# Patient Record
Sex: Female | Born: 1945
Health system: Southern US, Community
[De-identification: ages and names within clinical notes are randomized; demographics above are authoritative.]

## PROBLEM LIST (undated history)

## (undated) DIAGNOSIS — M8580 Other specified disorders of bone density and structure, unspecified site: Secondary | ICD-10-CM

## (undated) DIAGNOSIS — K269 Duodenal ulcer, unspecified as acute or chronic, without hemorrhage or perforation: Secondary | ICD-10-CM

## (undated) DIAGNOSIS — N951 Menopausal and female climacteric states: Secondary | ICD-10-CM

## (undated) DIAGNOSIS — L719 Rosacea, unspecified: Secondary | ICD-10-CM

## (undated) DIAGNOSIS — M810 Age-related osteoporosis without current pathological fracture: Secondary | ICD-10-CM

## (undated) HISTORY — DX: Duodenal ulcer, unspecified as acute or chronic, without hemorrhage or perforation: K26.9

## (undated) HISTORY — DX: Rosacea, unspecified: L71.9

## (undated) HISTORY — PX: TONSILLECTOMY AND ADENOIDECTOMY: SHX28

## (undated) HISTORY — PX: TUBAL LIGATION: SHX77

## (undated) HISTORY — DX: Age-related osteoporosis without current pathological fracture: M81.0

## (undated) HISTORY — DX: Menopausal and female climacteric states: N95.1

## (undated) HISTORY — DX: Other specified disorders of bone density and structure, unspecified site: M85.80

---

## 1998-08-26 ENCOUNTER — Other Ambulatory Visit: Admission: RE | Admit: 1998-08-26 | Discharge: 1998-08-26 | Payer: Self-pay | Admitting: Gynecology

## 1999-09-14 ENCOUNTER — Other Ambulatory Visit: Admission: RE | Admit: 1999-09-14 | Discharge: 1999-09-14 | Payer: Self-pay | Admitting: *Deleted

## 2000-03-08 ENCOUNTER — Other Ambulatory Visit: Admission: RE | Admit: 2000-03-08 | Discharge: 2000-03-08 | Payer: Self-pay | Admitting: *Deleted

## 2000-09-28 ENCOUNTER — Other Ambulatory Visit: Admission: RE | Admit: 2000-09-28 | Discharge: 2000-09-28 | Payer: Self-pay | Admitting: *Deleted

## 2001-10-28 ENCOUNTER — Other Ambulatory Visit: Admission: RE | Admit: 2001-10-28 | Discharge: 2001-10-28 | Payer: Self-pay | Admitting: *Deleted

## 2002-10-27 ENCOUNTER — Other Ambulatory Visit: Admission: RE | Admit: 2002-10-27 | Discharge: 2002-10-27 | Payer: Self-pay | Admitting: *Deleted

## 2002-11-03 ENCOUNTER — Encounter: Admission: RE | Admit: 2002-11-03 | Discharge: 2003-02-01 | Payer: Self-pay | Admitting: *Deleted

## 2003-10-29 ENCOUNTER — Other Ambulatory Visit: Admission: RE | Admit: 2003-10-29 | Discharge: 2003-10-29 | Payer: Self-pay | Admitting: *Deleted

## 2011-12-05 LAB — HM PAP SMEAR
HM Pap smear: NEGATIVE
HM Pap smear: NEGATIVE

## 2012-07-15 ENCOUNTER — Ambulatory Visit (INDEPENDENT_AMBULATORY_CARE_PROVIDER_SITE_OTHER): Payer: Medicare Other | Admitting: Family Medicine

## 2012-07-15 ENCOUNTER — Other Ambulatory Visit: Payer: Self-pay

## 2012-07-15 ENCOUNTER — Encounter: Payer: Self-pay | Admitting: Family Medicine

## 2012-07-15 VITALS — BP 110/70 | HR 58 | Wt 107.0 lb

## 2012-07-15 DIAGNOSIS — F5105 Insomnia due to other mental disorder: Secondary | ICD-10-CM

## 2012-07-15 DIAGNOSIS — F489 Nonpsychotic mental disorder, unspecified: Secondary | ICD-10-CM

## 2012-07-15 DIAGNOSIS — F4321 Adjustment disorder with depressed mood: Secondary | ICD-10-CM

## 2012-07-15 DIAGNOSIS — Z23 Encounter for immunization: Secondary | ICD-10-CM

## 2012-07-15 MED ORDER — ZOLPIDEM TARTRATE 5 MG PO TABS: 5.0000 mg | ORAL_TABLET | Freq: Every evening | ORAL | Status: DC | PRN

## 2012-07-15 NOTE — Progress Notes (Signed)
  Subjective:    Patient ID: RAINA SOLE, female    DOB: Dec 19, 1945, 66 y.o.   MRN: [id number]  HPI She is here for consultation. This is her first visit to the office as a patient. She did bring her husband here prior to his death. He died in 2023-02-12 after a long illness dealing with Alzheimer's. She has been involved in hospice and seems to be doing well with this. She does get regular gynecologic checkups. Review of record indicates need for colonoscopy however she is not interested. She states she had the shingles vaccine in 2012. She does use Ativan half of a 0.5 mg at bedtime 2 or 3 times per week. She would like a refill on this medication.   Review of Systems     Objective:   Physical Exam Alert and in no distress with appropriate affect.      Assessment & Plan:   1. Insomnia due to mental condition  zolpidem (AMBIEN) 5 MG tablet  2. Grief reaction  zolpidem (AMBIEN) 5 MG tablet   over 25 minutes spent discussing sleep and insomnia as well as her grief reaction. In general she seems to be handling the situation quite well. She does feel a sense of relief now that her husband has passed on. Instructed her on proper use of the Ambien.

## 2012-07-15 NOTE — Telephone Encounter (Signed)
Called med in 

## 2012-07-15 NOTE — Patient Instructions (Signed)

## 2013-04-28 ENCOUNTER — Ambulatory Visit (INDEPENDENT_AMBULATORY_CARE_PROVIDER_SITE_OTHER): Payer: Medicare Other | Admitting: Family Medicine

## 2013-04-28 ENCOUNTER — Encounter: Payer: Self-pay | Admitting: Family Medicine

## 2013-04-28 VITALS — BP 110/70 | HR 60 | Ht 62.0 in | Wt 106.0 lb

## 2013-04-28 DIAGNOSIS — R220 Localized swelling, mass and lump, head: Secondary | ICD-10-CM

## 2013-04-28 NOTE — Patient Instructions (Addendum)
Swelling of chin--discussed differential diagnosis including cystic lesion/acne, vs insect bite.  Doubt shingles, given lack of discomfort prior to rash, not really any vesicles, and limit to just one small area.  If truly is shingles, will spread in dermatomal region on right side of face, but doubt. Symptomatic treatment--warm compresses (vs ice).  May continue to use finacea over this area. Call if increasing redness, tenderness, heat/warmth to suggest infection, in which case we can start her on doxycycline.

## 2013-04-28 NOTE — Progress Notes (Signed)
Chief Complaint  Patient presents with  . Mass    bump that came up yeterday below her lip, just wanted to have it checked to make sure that is wasn't shingles. Bump in painful, not itching.   Yesterday, while out, she noticed a swollen area on her right chin.  It is throbbing, painful, not itchy. It is swollen today (around the central area).  It "feels funny".  Doesn't feel like a "zit".  She is concerned that it might be shingles.  She has had the vaccine. Denies pain prior to onset of swelling.  Denies any other lesions/rash  Past Medical History  Diagnosis Date  . Rosacea    History reviewed. No pertinent past surgical history. History   Social History  . Marital Status: Single    Spouse Name: N/A    Number of Children: N/A  . Years of Education: N/A   Occupational History  . Not on file.   Social History Main Topics  . Smoking status: Never Smoker   . Smokeless tobacco: Never Used  . Alcohol Use: No  . Drug Use: No  . Sexually Active: Not on file   Other Topics Concern  . Not on file   Social History Narrative  . No narrative on file   Current Outpatient Prescriptions on File Prior to Visit  Medication Sig Dispense Refill  . b complex vitamins capsule Take 1 capsule by mouth daily.      . calcium carbonate 1250 MG capsule Take 1,250 mg by mouth 2 (two) times daily with a meal.      . cholecalciferol (VITAMIN D) 1000 UNITS tablet Take 1,000 Units by mouth daily.      . Multiple Vitamins-Minerals (MULTIVITAMIN WITH MINERALS) tablet Take 1 tablet by mouth daily.      . vitamin E 1000 UNIT capsule Take 1,000 Units by mouth daily.       No current facility-administered medications on file prior to visit.   Allergies  Allergen Reactions  . Bactrim (Sulfamethoxazole W-Trimethoprim) Hives   ROS:  Denies fevers, numbness, tingling, oral lesions, URI symptoms, fevers, cough, shortness of breath, chest pain or any other concerns  PHYSICAL EXAM: BP 110/70  Pulse 60   Ht 5\' 2"  (1.575 m)  Wt 106 lb (48.081 kg)  BMI 19.38 kg/m2 Well developed female in no distress Below right lip, in upper chin area there is a 2 x 1 cm area of soft tissue swelling.  This area feels slightly indurated, and central area is a little red, no actual vesicle or pustule present currently.  No cervical lymphadenopathy  ASSESSMENT/PLAN:  Right facial swelling  Swelling of chin--discussed differential diagnosis including cystic lesion/acne, vs insect bite.  Doubt shingles, given lack of discomfort prior to rash, not really any vesicles, and limit to just one small area.  If truly is shingles, will spread in dermatomal region on right side of face, but doubt. Symptomatic treatment--warm compresses (vs ice).  May continue to use finacea over this area. Call if increasing redness, tenderness, heat/warmth to suggest infection, in which case we can start her on doxycycline.  Her GYN retired, and same office is no longer taking Medicare.  She plans to come here for routine CPE/GYN exam.  Asked to get records transferred.

## 2013-06-27 ENCOUNTER — Encounter: Payer: Self-pay | Admitting: Internal Medicine

## 2013-07-31 ENCOUNTER — Encounter: Payer: Self-pay | Admitting: *Deleted

## 2013-09-15 LAB — HM MAMMOGRAPHY

## 2013-09-16 ENCOUNTER — Encounter: Payer: Self-pay | Admitting: Internal Medicine

## 2013-10-01 ENCOUNTER — Encounter: Payer: Self-pay | Admitting: Family Medicine

## 2013-10-01 ENCOUNTER — Ambulatory Visit (INDEPENDENT_AMBULATORY_CARE_PROVIDER_SITE_OTHER): Payer: Medicare Other | Admitting: Family Medicine

## 2013-10-01 VITALS — BP 128/76 | HR 68 | Ht 62.0 in | Wt 106.0 lb

## 2013-10-01 DIAGNOSIS — R5381 Other malaise: Secondary | ICD-10-CM

## 2013-10-01 DIAGNOSIS — Z0000 Encounter for general adult medical examination without abnormal findings: Secondary | ICD-10-CM

## 2013-10-01 DIAGNOSIS — E7800 Pure hypercholesterolemia, unspecified: Secondary | ICD-10-CM

## 2013-10-01 DIAGNOSIS — M8580 Other specified disorders of bone density and structure, unspecified site: Secondary | ICD-10-CM | POA: Insufficient documentation

## 2013-10-01 DIAGNOSIS — L719 Rosacea, unspecified: Secondary | ICD-10-CM | POA: Insufficient documentation

## 2013-10-01 DIAGNOSIS — M899 Disorder of bone, unspecified: Secondary | ICD-10-CM

## 2013-10-01 DIAGNOSIS — J309 Allergic rhinitis, unspecified: Secondary | ICD-10-CM | POA: Insufficient documentation

## 2013-10-01 LAB — COMPREHENSIVE METABOLIC PANEL
ALT: 21 U/L (ref 0–35)
AST: 22 U/L (ref 0–37)
Albumin: 4.3 g/dL (ref 3.5–5.2)
Alkaline Phosphatase: 55 U/L (ref 39–117)
BUN: 15 mg/dL (ref 6–23)
CO2: 32 mEq/L (ref 19–32)
Calcium: 9.6 mg/dL (ref 8.4–10.5)
Chloride: 98 mEq/L (ref 96–112)
Creat: 0.67 mg/dL (ref 0.50–1.10)
Potassium: 4 mEq/L (ref 3.5–5.3)
Sodium: 135 mEq/L (ref 135–145)
Total Protein: 7.7 g/dL (ref 6.0–8.3)

## 2013-10-01 LAB — LIPID PANEL
HDL: 77 mg/dL (ref >39)
LDL Cholesterol: 128 mg/dL — ABNORMAL HIGH (ref 0–99)
Total CHOL/HDL Ratio: 2.9 Ratio

## 2013-10-01 LAB — CBC WITH DIFFERENTIAL/PLATELET
Eosinophils Absolute: 0.2 10*3/uL (ref 0.0–0.7)
Eosinophils Relative: 4 % (ref 0–5)
Hemoglobin: 13.2 g/dL (ref 12.0–15.0)
Lymphocytes Relative: 42 % (ref 12–46)
Lymphs Abs: 1.7 10*3/uL (ref 0.7–4.0)
MCH: 31.2 pg (ref 26.0–34.0)
MCHC: 34.2 g/dL (ref 30.0–36.0)
MCV: 91.3 fL (ref 78.0–100.0)
Monocytes Relative: 8 % (ref 3–12)
Neutrophils Relative %: 45 % (ref 43–77)
Platelets: 318 10*3/uL (ref 150–400)
WBC: 4.1 10*3/uL (ref 4.0–10.5)

## 2013-10-01 LAB — POCT URINALYSIS DIPSTICK
Bilirubin, UA: NEGATIVE
Glucose, UA: NEGATIVE
Ketones, UA: NEGATIVE
Leukocytes, UA: NEGATIVE
Nitrite, UA: NEGATIVE
Urobilinogen, UA: NEGATIVE
pH, UA: 6

## 2013-10-01 LAB — TSH: TSH: 0.742 u[IU]/mL (ref 0.350–4.500)

## 2013-10-01 NOTE — Progress Notes (Signed)
Chief Complaint  Patient presents with  . Annual Exam    fasting annual exam, unsure if she needs pelvic exam as her previous doctor is not taking her insurance anymore. Brought DEXA from 08/2012 would like to know if you could go over results with her. UA showed trace blood-no symptoms. Did not do vision as she just had done.    Elizabeth Haynes is a 67 y.o. female who presents for a complete physical.  She has the following concerns:  Osteopenia--brought in report of last DEXA  to discuss.  H/o fosamax treatment in the past for about 5 years, stopped over 5 years ago (2008 per records).  Hesitant to take any meds, has concerns.  She is taking calcium, vitamin D and getting weight bearing exercise.  Has been having issues with allergies.  Had some cough, but has improved.  Immunization History  Administered Date(s) Administered  . Influenza Split 08/11/2013  . Pneumococcal Polysaccharide 07/15/2012  . Tdap 07/15/2012  states she got Zostavax at [facility] in 2012 (?) Last Pap smear: 11/2011--negative, with no high risk HPV Last mammogram: 08/2013 Last colonoscopy: never and refuses Last DEXA: 08/2012, T-2.2 lumbar spine Dentist: twice yearly Ophtho: every other year, went 06/2013 Exercise:  Walks 1.5 miles daily (30 minutes).  Does weight routine 2x/week Had normal vitamin D level 10/2009 (47.5) Last lipids 01/2007 normal (TC 194, TG 109, HDL 70; LDL 102, ratio 2.8).  Lipids in 10/07 had been much higher (total 254, LDL 150) when eating more cheese and butter Last TSH, LFT's and other tests were 10/07  Past Medical History  Diagnosis Date  . Rosacea   . Osteopenia   . Symptomatic menopausal or female climacteric states     Past Surgical History  Procedure Laterality Date  . Tonsillectomy and adenoidectomy  age 66  . Tubal ligation      History   Social History  . Marital Status: Single    Spouse Name: N/A    Number of Children: N/A  . Years of Education: N/A   Occupational  History  . Not on file.   Social History Main Topics  . Smoking status: Never Smoker   . Smokeless tobacco: Never Used  . Alcohol Use: No  . Drug Use: No  . Sexual Activity: No   Other Topics Concern  . Not on file   Social History Narrative   Lives with dog.  2 stepchildren ([place] and [place]).  Widowed    Family History  Problem Relation Age of Onset  . Dementia Mother   . Hip fracture Mother     x 2  . Alzheimer's disease Mother   . Hypertension Mother   . Heart disease Mother     pacemaker  . Cancer Father     thyroid,lung,prostate  . Breast cancer Maternal Grandmother   . Heart attack Maternal Grandfather   . Diabetes Other     Current outpatient prescriptions:Azelaic Acid (FINACEA EX), Apply topically., Disp: , Rfl: ;  b complex vitamins capsule, Take 1 capsule by mouth daily., Disp: , Rfl: ;  calcium carbonate 1250 MG capsule, Take 1,250 mg by mouth 2 (two) times daily with a meal., Disp: , Rfl: ;  cholecalciferol (VITAMIN D) 1000 UNITS tablet, Take 1,000 Units by mouth daily., Disp: , Rfl:  Multiple Vitamins-Minerals (MULTIVITAMIN WITH MINERALS) tablet, Take 1 tablet by mouth daily., Disp: , Rfl: ;  vitamin E 1000 UNIT capsule, Take 1,000 Units by mouth daily., Disp: , Rfl: ;  doxycycline (DORYX) 100 MG DR capsule, Take 100 mg by mouth as needed., Disp: , Rfl:   Allergies  Allergen Reactions  . Bactrim [Sulfamethoxazole-Trimethoprim] Hives   ROS:  The patient denies anorexia, fever, weight changes, headaches,  vision changes, decreased hearing, ear pain, sore throat, breast concerns, chest pain, palpitations, dizziness, syncope, dyspnea on exertion, cough, swelling, nausea, vomiting, diarrhea, constipation, abdominal pain, melena, hematochezia, indigestion/heartburn, hematuria, incontinence, dysuria, vaginal bleeding, discharge, odor or itch, genital lesions, joint pains, numbness, tingling, weakness, tremor, suspicious skin lesions, depression, anxiety, abnormal  bleeding/bruising, or enlarged lymph nodes.  Sees dermatologist regularly, no mole changes or concerns. Intermittent trouble sleeping.  No significant hot flashes  PHYSICAL EXAM: BP 128/76  Pulse 68  Ht 5\' 2"  (1.575 m)  Wt 106 lb (48.081 kg)  BMI 19.38 kg/m2  General Appearance:    Alert, cooperative, no distress, appears stated age.  Very talkative  Head:    Normocephalic, without obvious abnormality, atraumatic  Eyes:    PERRL, conjunctiva/corneas clear, EOM's intact, fundi    benign  Ears:    Normal TM's and external ear canals  Nose:   Nares normal, nasal mucosa pale, mildly edematous.   Sinuses nontender  Throat:   Lips, mucosa, and tongue normal; teeth and gums normal. +cobblestoning posteriorly.  Neck:   Supple, no lymphadenopathy;  thyroid:  no   enlargement/tenderness/nodules; no carotid   bruit or JVD  Back:    Spine nontender, no curvature, ROM normal, no CVA     tenderness  Lungs:     Clear to auscultation bilaterally without wheezes, rales or     ronchi; respirations unlabored  Chest Wall:    No tenderness or deformity   Heart:    Regular rate and rhythm, S1 and S2 normal, no murmur, rub   or gallop  Breast Exam:    No tenderness, masses, or nipple discharge or inversion.      No axillary lymphadenopathy  Abdomen:     Soft, non-tender, nondistended, normoactive bowel sounds,    no masses, no hepatosplenomegaly  Genitalia:    Normal external genitalia without lesions. Mild atrophic changes noted. BUS and vagina normal; no cervical motion tenderness. No abnormal vaginal discharge.  Uterus and adnexa not enlarged, nontender, no masses.  Pap not performed  Rectal:    Normal tone, no masses or tenderness; guaiac negative stool  Extremities:   No clubbing, cyanosis or edema  Pulses:   2+ and symmetric all extremities  Skin:   Skin color, texture, turgor normal, no rashes or lesions. Many large SK's scattered around trunk, arms  Lymph nodes:   Cervical, supraclavicular, and  axillary nodes normal  Neurologic:   CNII-XII intact, normal strength, sensation and gait; reflexes 2+ and symmetric throughout          Psych:   Normal mood, affect, hygiene and grooming.    ASSESSMENT/PLAN:  Routine general medical examination at a health care facility - Plan: POCT Urinalysis Dipstick, Comprehensive metabolic panel, CBC with Differential, TSH, Lipid panel  Osteopenia - continue Calcium, Vit D, weight bearing exercise.  Recheck DEXA 08/2014 (at [facility])  Rosacea - controlled (per derm)  Pure hypercholesterolemia - high in past; better after dietary changes.  recheck today - Plan: Lipid panel  Other malaise and fatigue - Plan: Comprehensive metabolic panel, CBC with Differential, TSH  Allergic rhinitis, cause unspecified - OTC meds reviewed  Allergies, seasonal--feels too "hyper" from the zyrtec-D. Recommended changing to plain zyrtec or claritin as needed.  Osteopenia--repeat  DEXA next year 08/2014, at [place].  Reviewed calcium, vitamin D, weight bearing exercise.  Briefly discussed possibility of restarting bisphosphonate if declining significantly  Discussed monthly self breast exams and yearly mammograms; at least 30 minutes of aerobic activity at least 5 days/week; proper sunscreen use reviewed; healthy diet, including goals of calcium and vitamin D intake and alcohol recommendations (less than or equal to 1 drink/day) reviewed; regular seatbelt use; changing batteries in smoke detectors.  Immunization recommendations discussed.  Colonoscopy recommendations reviewed--she refuses. Counseled re: colonoscopy (risks, benefits) in detail.  Will call for referral if she changes her mind.  Hemoccult cards given  Pt will check date for zostavax and let us know.  If unable to find, will let us know so that we can at least document in computer the year, showing it was given.

## 2013-10-01 NOTE — Patient Instructions (Addendum)
HEALTH MAINTENANCE RECOMMENDATIONS:  It is recommended that you get at least 30 minutes of aerobic exercise at least 5 days/week (for weight loss, you may need as much as 60-90 minutes). This can be any activity that gets your heart rate up. This can be divided in 10-15 minute intervals if needed, but try and build up your endurance at least once a week.  Weight bearing exercise is also recommended twice weekly.  Eat a healthy diet with lots of vegetables, fruits and fiber.  "Colorful" foods have a lot of vitamins (ie green vegetables, tomatoes, red peppers, etc).  Limit sweet tea, regular sodas and alcoholic beverages, all of which has a lot of calories and sugar.  Up to 1 alcoholic drink daily may be beneficial for women (unless trying to lose weight, watch sugars).  Drink a lot of water.  Calcium recommendations are 1200-1500 mg daily (1500 mg for postmenopausal women or women without ovaries), and vitamin D 1000 IU daily.  This should be obtained from diet and/or supplements (vitamins), and calcium should not be taken all at once, but in divided doses.  Monthly self breast exams and yearly mammograms for women over the age of 3 is recommended.  Sunscreen of at least SPF 30 should be used on all sun-exposed parts of the skin when outside between the hours of 10 am and 4 pm (not just when at beach or pool, but even with exercise, golf, tennis, and yard work!)  Use a sunscreen that says "broad spectrum" so it covers both UVA and UVB rays, and make sure to reapply every 1-2 hours.  Remember to change the batteries in your smoke detectors when changing your clock times in the spring and fall.  Use your seat belt every time you are in a car, and please drive safely and not be distracted with cell phones and texting while driving.  Please call us with date of shingles vaccine (call us even if you can't find it, so that we can at least enter the YEAR given in the computer, to document that you did have  the vaccine).  Repeat bone density test is due 08/2014

## 2013-10-02 ENCOUNTER — Telehealth: Payer: Self-pay | Admitting: Family Medicine

## 2013-10-02 NOTE — Telephone Encounter (Signed)
Pt called and states she rcd shingles vaccine on 2045-12-22 at [facility].  I have documented in her historical immunizations.

## 2013-10-03 ENCOUNTER — Encounter: Payer: Self-pay | Admitting: Family Medicine

## 2013-10-16 ENCOUNTER — Telehealth: Payer: Self-pay | Admitting: Internal Medicine

## 2013-10-16 ENCOUNTER — Telehealth: Payer: Self-pay | Admitting: *Deleted

## 2013-10-16 NOTE — Telephone Encounter (Signed)
OV recommended to truly be able to address her question.  She had evidence of allergies on her last visit.  If she isn't having any fever or any discolored mucus (from nose or cough), then likely doesn't need antibiotic.  But she still might benefit from changes in medications to treat allergies (can discuss meds like singulair, nasal steroids).  I recommend using the guaifenesin (mucinex) twice daily, continue antihistamine and decongestant as needed

## 2013-10-16 NOTE — Telephone Encounter (Signed)
Pt was seen back on 11/5 and she states she discussed this at her visit some and she isn't sure if she doesn't need an antibiotic.She states she is congested,sinus pressure,ear and teeth pain. She is taking some decongestant during the day, 24 hour benadryl, and 12 hour mucinex in the morning. Marland Kitchen i advised pt that she will probably need an appt but i would send a message back. If you send something to pharmacy send to [facility]

## 2013-10-16 NOTE — Telephone Encounter (Signed)
Spoke with patient and she will do as Dr.Knapp's says and she will call next week if she is not better and schedule OV.

## 2014-08-11 ENCOUNTER — Telehealth: Payer: Self-pay | Admitting: Family Medicine

## 2014-08-11 NOTE — Telephone Encounter (Signed)
Needs order for bone density sent to [facility]   Her appt is 09/16/14 @ 2:15

## 2014-08-11 NOTE — Telephone Encounter (Signed)
Ok to fax order, dx f/u osteopenia

## 2014-08-12 NOTE — Telephone Encounter (Signed)
Order faxed.

## 2014-09-16 LAB — HM DEXA SCAN

## 2014-09-16 LAB — HM MAMMOGRAPHY

## 2014-09-22 ENCOUNTER — Encounter: Payer: Self-pay | Admitting: Internal Medicine

## 2015-04-01 ENCOUNTER — Ambulatory Visit (INDEPENDENT_AMBULATORY_CARE_PROVIDER_SITE_OTHER): Payer: Medicare Other | Admitting: Family Medicine

## 2015-04-01 ENCOUNTER — Encounter: Payer: Self-pay | Admitting: Family Medicine

## 2015-04-01 ENCOUNTER — Telehealth: Payer: Self-pay | Admitting: *Deleted

## 2015-04-01 ENCOUNTER — Other Ambulatory Visit (HOSPITAL_COMMUNITY)
Admission: RE | Admit: 2015-04-01 | Discharge: 2015-04-01 | Disposition: A | Discharge status: Home / Self Care | Payer: Medicare Other | Source: Ambulatory Visit | Attending: Family Medicine | Admitting: Family Medicine

## 2015-04-01 VITALS — BP 118/72 | HR 52 | Ht 62.0 in | Wt 114.0 lb

## 2015-04-01 DIAGNOSIS — Z23 Encounter for immunization: Secondary | ICD-10-CM | POA: Diagnosis not present

## 2015-04-01 DIAGNOSIS — Z0000 Encounter for general adult medical examination without abnormal findings: Secondary | ICD-10-CM

## 2015-04-01 DIAGNOSIS — Z124 Encounter for screening for malignant neoplasm of cervix: Secondary | ICD-10-CM | POA: Diagnosis present

## 2015-04-01 DIAGNOSIS — Z1151 Encounter for screening for human papillomavirus (HPV): Secondary | ICD-10-CM | POA: Diagnosis present

## 2015-04-01 DIAGNOSIS — Z5181 Encounter for therapeutic drug level monitoring: Secondary | ICD-10-CM | POA: Diagnosis not present

## 2015-04-01 DIAGNOSIS — M8580 Other specified disorders of bone density and structure, unspecified site: Secondary | ICD-10-CM

## 2015-04-01 DIAGNOSIS — E7800 Pure hypercholesterolemia, unspecified: Secondary | ICD-10-CM

## 2015-04-01 LAB — POCT URINALYSIS DIPSTICK
BILIRUBIN UA: NEGATIVE
Blood, UA: NEGATIVE
GLUCOSE UA: NEGATIVE
Ketones, UA: NEGATIVE
Leukocytes, UA: NEGATIVE
Nitrite, UA: NEGATIVE
Protein, UA: NEGATIVE
SPEC GRAV UA: 1.015
UROBILINOGEN UA: NEGATIVE
pH, UA: 6

## 2015-04-01 LAB — COMPREHENSIVE METABOLIC PANEL
ALK PHOS: 53 U/L (ref 39–117)
ALT: 21 U/L (ref 0–35)
AST: 23 U/L (ref 0–37)
Albumin: 4.2 g/dL (ref 3.5–5.2)
BUN: 13 mg/dL (ref 6–23)
CHLORIDE: 97 meq/L (ref 96–112)
CO2: 25 meq/L (ref 19–32)
Calcium: 9.4 mg/dL (ref 8.4–10.5)
Creat: 0.7 mg/dL (ref 0.50–1.10)
Glucose, Bld: 91 mg/dL (ref 70–99)
POTASSIUM: 3.9 meq/L (ref 3.5–5.3)
SODIUM: 133 meq/L — AB (ref 135–145)
TOTAL PROTEIN: 7.6 g/dL (ref 6.0–8.3)
Total Bilirubin: 0.7 mg/dL (ref 0.2–1.2)

## 2015-04-01 LAB — LIPID PANEL
Cholesterol: 216 mg/dL — ABNORMAL HIGH (ref 0–200)
HDL: 101 mg/dL (ref >=46)
LDL CALC: 103 mg/dL — AB (ref 0–99)
Total CHOL/HDL Ratio: 2.1 Ratio
Triglycerides: 62 mg/dL (ref <150)
VLDL: 12 mg/dL (ref 0–40)

## 2015-04-01 LAB — TSH: TSH: 0.971 u[IU]/mL (ref 0.350–4.500)

## 2015-04-01 NOTE — Patient Instructions (Signed)

## 2015-04-01 NOTE — Progress Notes (Signed)
Chief Complaint  Patient presents with  . Annual Exam    fasting annual exam with pap. No concerns.    Elizabeth Haynes is a 69 y.o. female who presents for a complete physical.  She has the following concerns:  Osteopenia:  Previously took Fosamax for about 5 years (stopped around 2008).  Last DEXA was 08/2014 and showed decline in hips, T-2.4. She is taking calcium and D supplements, unsure of doses. Thinks her D is 3000 BID and she takes a calcium +D BID (and also takes a MVI).  She uses weights every other day.  H/o hyperlipidemia, improved with dietary changes. Last checked 09/2013.  Allergies: seasonally in spring and fall.  She uses neti-pot and Flonase and is well controlled.  Allegra "dried me up too much"  Immunization History  Administered Date(s) Administered  . Influenza Split 08/11/2013  . Pneumococcal Polysaccharide-23 07/15/2012  . Tdap 07/15/2012  . Zoster 11/10/2009  got high dose flu shot at [facility] [employer] (07/2014) Last Pap smear: 11/2011--negative, with no high risk HPV Last mammogram: 08/2014 Last colonoscopy: never and refuses Last DEXA: 08/2014, T-2.4 with significant decline in both hips compared to 2015. Dentist: twice yearly Ophtho: up to date, think she went last year.  May have been 06/2013 (seh can't recall, but knows she only goes every other year) Exercise: Walks 1.5 miles daily (30 minutes). Does weight routine every other day. Had normal vitamin D level 10/2009 (47.5); but as noted above, had decline in bone density since then.  She has healthcare power of attorney and Living Will. No falls or depression (see screen).  Past Medical History  Diagnosis Date  . Rosacea   . Osteopenia   . Symptomatic menopausal or female climacteric states     Past Surgical History  Procedure Laterality Date  . Tonsillectomy and adenoidectomy  age 58  . Tubal ligation      History   Social History  . Marital Status: Single    Spouse Name: N/A  . Number of  Children: N/A  . Years of Education: N/A   Occupational History  . Not on file.   Social History Main Topics  . Smoking status: Never Smoker   . Smokeless tobacco: Never Used  . Alcohol Use: No  . Drug Use: No  . Sexual Activity: No   Other Topics Concern  . Not on file   Social History Narrative   Lives with dog.  2 stepchildren (Bricelyn and Sherrill).  Widowed. 8 grandchildren    Family History  Problem Relation Age of Onset  . Dementia Mother   . Hip fracture Mother     x 2  . Alzheimer's disease Mother   . Hypertension Mother   . Heart disease Mother     pacemaker  . Cancer Father     thyroid,lung,prostate  . Breast cancer Maternal Grandmother   . Heart attack Maternal Grandfather   . Diabetes Other    Current Outpatient Prescriptions on File Prior to Visit  Medication Sig Dispense Refill  . Azelaic Acid (FINACEA EX) Apply topically.    Marland Kitchen b complex vitamins capsule Take 1 capsule by mouth daily.    . calcium carbonate 1250 MG capsule Take 1,250 mg by mouth 2 (two) times daily with a meal.    . cholecalciferol (VITAMIN D) 1000 UNITS tablet Take 1,000 Units by mouth daily.    Marland Kitchen doxycycline (DORYX) 100 MG DR capsule Take 100 mg by mouth as needed.    Marland Kitchen  Multiple Vitamins-Minerals (MULTIVITAMIN WITH MINERALS) tablet Take 1 tablet by mouth daily.    . vitamin E 1000 UNIT capsule Take 1,000 Units by mouth daily.     No current facility-administered medications on file prior to visit.    Allergies  Allergen Reactions  . Bactrim [Sulfamethoxazole-Trimethoprim] Hives   ROS: The patient denies anorexia, fever, weight changes, headaches, vision changes, decreased hearing, ear pain, sore throat, breast concerns, chest pain, palpitations, dizziness, syncope, dyspnea on exertion, cough, swelling, nausea, vomiting, diarrhea, constipation, abdominal pain, melena, hematochezia, indigestion/heartburn, hematuria, incontinence, dysuria, vaginal bleeding, discharge, odor or itch,  genital lesions, joint pains, numbness, tingling, weakness, tremor, suspicious skin lesions, depression, anxiety, abnormal bleeding/bruising, or enlarged lymph nodes. Sees dermatologist periodically, no mole changes or concerns. Intermittent trouble sleeping. No significant hot flashes   PHYSICAL EXAM:  BP 118/72 mmHg  Pulse 52  Ht 5\' 2"  (1.575 m)  Wt 114 lb (51.71 kg)  BMI 20.85 kg/m2  General Appearance:   Alert, cooperative, no distress, appears stated age. Very talkative  Head:   Normocephalic, without obvious abnormality, atraumatic  Eyes:   PERRL, conjunctiva/corneas clear, EOM's intact, fundi   benign  Ears:   Normal TM's and external ear canals  Nose:  Nares normal, nasal mucosa is normal, no drainage. Sinuses nontender  Throat:  Lips, mucosa, and tongue normal; teeth and gums normal. +mild cobblestoning posteriorly.  Neck:  Supple, no lymphadenopathy; thyroid: no enlargement/tenderness/nodules; no carotid  bruit or JVD  Back:  Spine nontender, no curvature, ROM normal, no CVA tenderness  Lungs:   Clear to auscultation bilaterally without wheezes, rales or ronchi; respirations unlabored  Chest Wall:   No tenderness or deformity  Heart:   Regular rate and rhythm, S1 and S2 normal, no murmur, rub  or gallop  Breast Exam:   No tenderness, masses, or nipple discharge or inversion. No axillary lymphadenopathy  Abdomen:   Soft, non-tender, nondistended, normoactive bowel sounds,   no masses, no hepatosplenomegaly  Genitalia:   Normal external genitalia without lesions. Atrophic changes noted. BUS and vagina normal; no cervical motion tenderness. Cervix is without lesions.  Os is stenotic. No abnormal vaginal discharge. Uterus and adnexa not enlarged, nontender, no masses. Pap performed  Rectal:   Normal tone, no masses or tenderness; guaiac negative stool  Extremities:  No clubbing, cyanosis or edema   Pulses:  2+ and symmetric all extremities  Skin:  Skin color, texture, turgor normal, no rashes or lesions. Many large SK's scattered around trunk, arms  Lymph nodes:  Cervical, supraclavicular, and axillary nodes normal  Neurologic:  CNII-XII intact, normal strength, sensation and gait; reflexes 2+ and symmetric throughout   Psych: Normal mood, affect, hygiene and grooming        ASSESSMENT/PLAN:  Annual physical exam - Plan: POCT Urinalysis Dipstick, Visual acuity screening, Comprehensive metabolic panel, TSH, Vit D  25 hydroxy (rtn osteoporosis monitoring), Lipid panel, Cytology - PAP [place]  Osteopenia - declining bone density.  reviewed calcium and D recommendations (she is to contact us with doses). recheck 08/2016 - Plan: Vit D  25 hydroxy (rtn osteoporosis monitoring)  Pure hypercholesterolemia - continue lowfat, low cholesterol diet - Plan: Lipid panel  Medication monitoring encounter - Plan: Comprehensive metabolic panel, TSH, Vit D  25 hydroxy (rtn osteoporosis monitoring)  Immunization due - Plan: Pneumococcal conjugate vaccine 13-valent   Osteopenia--on borderline of osteoporosis with decline from 2013 to 2015.  Suspect she likely will need to restart bone-building therapy after next check.  Continue  calcium, D and weight-bearing exercise to try and minimize further bone loss.  Need to confirm her Ca and D doses.  Discussed monthly self breast exams and yearly mammograms; at least 30 minutes of aerobic activity at least 5 days/week; proper sunscreen use reviewed; healthy diet, including goals of calcium and vitamin D intake and alcohol recommendations (less than or equal to 1 drink/day) reviewed; regular seatbelt use; changing batteries in smoke detectors. Immunization recommendations discussed. Colonoscopy recommendations reviewed--she refuses. Counseled re: colonoscopy (risks, benefits) in detail. She again declines, but is  interested in Cologard.  Discussed that colonoscopy would be recommended for abnormal cologard, and she understands this.  Cologard info and referral Prevnar-13  F/u 1 year

## 2015-04-01 NOTE — Telephone Encounter (Signed)
Patient called in and said she is taking vitamin D 2,000iu daily and 500iu within her MVI.

## 2015-04-02 LAB — CYTOLOGY - PAP

## 2015-04-02 LAB — VITAMIN D 25 HYDROXY (VIT D DEFICIENCY, FRACTURES): Vit D, 25-Hydroxy: 67 ng/mL (ref 30–100)

## 2015-10-01 LAB — HM MAMMOGRAPHY: HM MAMMO: NEGATIVE

## 2016-02-03 DIAGNOSIS — L821 Other seborrheic keratosis: Secondary | ICD-10-CM | POA: Diagnosis not present

## 2016-02-03 DIAGNOSIS — L718 Other rosacea: Secondary | ICD-10-CM | POA: Diagnosis not present

## 2016-02-03 DIAGNOSIS — L218 Other seborrheic dermatitis: Secondary | ICD-10-CM | POA: Diagnosis not present

## 2016-05-31 DIAGNOSIS — H5213 Myopia, bilateral: Secondary | ICD-10-CM | POA: Diagnosis not present

## 2016-08-01 DIAGNOSIS — Z124 Encounter for screening for malignant neoplasm of cervix: Secondary | ICD-10-CM | POA: Diagnosis not present

## 2016-08-01 DIAGNOSIS — M8580 Other specified disorders of bone density and structure, unspecified site: Secondary | ICD-10-CM | POA: Diagnosis not present

## 2016-09-27 DIAGNOSIS — M810 Age-related osteoporosis without current pathological fracture: Secondary | ICD-10-CM

## 2016-09-27 HISTORY — DX: Age-related osteoporosis without current pathological fracture: M81.0

## 2016-10-02 DIAGNOSIS — M810 Age-related osteoporosis without current pathological fracture: Secondary | ICD-10-CM | POA: Diagnosis not present

## 2016-10-02 DIAGNOSIS — Z1231 Encounter for screening mammogram for malignant neoplasm of breast: Secondary | ICD-10-CM | POA: Diagnosis not present

## 2016-10-02 LAB — HM MAMMOGRAPHY

## 2016-10-02 LAB — HM DEXA SCAN

## 2016-10-03 ENCOUNTER — Encounter: Payer: Self-pay | Admitting: Family Medicine

## 2016-10-04 ENCOUNTER — Encounter: Payer: Self-pay | Admitting: Family Medicine

## 2016-10-12 DIAGNOSIS — M810 Age-related osteoporosis without current pathological fracture: Secondary | ICD-10-CM | POA: Diagnosis not present

## 2016-10-16 ENCOUNTER — Institutional Professional Consult (permissible substitution): Payer: Medicare Other | Admitting: Family Medicine

## 2017-08-08 DIAGNOSIS — K045 Chronic apical periodontitis: Secondary | ICD-10-CM | POA: Diagnosis not present

## 2017-09-01 DIAGNOSIS — Z23 Encounter for immunization: Secondary | ICD-10-CM | POA: Diagnosis not present

## 2017-09-17 DIAGNOSIS — N6310 Unspecified lump in the right breast, unspecified quadrant: Secondary | ICD-10-CM | POA: Diagnosis not present

## 2017-09-17 DIAGNOSIS — M810 Age-related osteoporosis without current pathological fracture: Secondary | ICD-10-CM | POA: Diagnosis not present

## 2017-10-08 DIAGNOSIS — Z1231 Encounter for screening mammogram for malignant neoplasm of breast: Secondary | ICD-10-CM | POA: Diagnosis not present

## 2017-10-08 LAB — HM MAMMOGRAPHY

## 2017-10-09 ENCOUNTER — Encounter: Payer: Self-pay | Admitting: Family Medicine

## 2018-04-15 DIAGNOSIS — H2513 Age-related nuclear cataract, bilateral: Secondary | ICD-10-CM | POA: Diagnosis not present

## 2018-04-15 DIAGNOSIS — H1851 Endothelial corneal dystrophy: Secondary | ICD-10-CM | POA: Diagnosis not present

## 2018-04-15 DIAGNOSIS — H04123 Dry eye syndrome of bilateral lacrimal glands: Secondary | ICD-10-CM | POA: Diagnosis not present

## 2018-04-15 DIAGNOSIS — H43813 Vitreous degeneration, bilateral: Secondary | ICD-10-CM | POA: Diagnosis not present

## 2018-06-21 DIAGNOSIS — Z0000 Encounter for general adult medical examination without abnormal findings: Secondary | ICD-10-CM | POA: Diagnosis not present

## 2018-07-07 DIAGNOSIS — M810 Age-related osteoporosis without current pathological fracture: Secondary | ICD-10-CM | POA: Insufficient documentation

## 2018-07-07 NOTE — Patient Instructions (Addendum)
HEALTH MAINTENANCE RECOMMENDATIONS:  It is recommended that you get at least 30 minutes of aerobic exercise at least 5 days/week (for weight loss, you may need as much as 60-90 minutes). This can be any activity that gets your heart rate up. This can be divided in 10-15 minute intervals if needed, but try and build up your endurance at least once a week.  Weight bearing exercise is also recommended twice weekly.  Eat a healthy diet with lots of vegetables, fruits and fiber.  "Colorful" foods have a lot of vitamins (ie green vegetables, tomatoes, red peppers, etc).  Limit sweet tea, regular sodas and alcoholic beverages, all of which has a lot of calories and sugar.  Up to 1 alcoholic drink daily may be beneficial for women (unless trying to lose weight, watch sugars).  Drink a lot of water.  Calcium recommendations are 1200-1500 mg daily (1500 mg for postmenopausal women or women without ovaries), and vitamin D 1000 IU daily.  This should be obtained from diet and/or supplements (vitamins), and calcium should not be taken all at once, but in divided doses.  Monthly self breast exams and yearly mammograms for women over the age of 60 is recommended.  Sunscreen of at least SPF 30 should be used on all sun-exposed parts of the skin when outside between the hours of 10 am and 4 pm (not just when at beach or pool, but even with exercise, golf, tennis, and yard work!)  Use a sunscreen that says "broad spectrum" so it covers both UVA and UVB rays, and make sure to reapply every 1-2 hours.  Remember to change the batteries in your smoke detectors when changing your clock times in the spring and fall.  Use your seat belt every time you are in a car, and please drive safely and not be distracted with cell phones and texting while driving.    Ms. Elizabeth Haynes , Thank you for taking time to come for your Medicare Wellness Visit. I appreciate your ongoing commitment to your health goals. Please review the following  plan we discussed and let me know if I can assist you in the future.   This is a list of the screening recommended for you and due dates:  Health Maintenance  Topic Date Due  .  Hepatitis C: One time screening is recommended by Center for Disease Control  (CDC) for  adults born from [birth year] through 1965.   December 05, 1945  . Colon Cancer Screening  09/17/1996  . Flu Shot  06/27/2018  . Mammogram  10/09/2019  . Tetanus Vaccine  07/15/2022  . DEXA scan (bone density measurement)  Completed  . Pneumonia vaccines  Completed   We are screening for hepatitis C with today's bloodwork. Your next mammogram is due 09/2018 (not 2020 as stated above--they are recommended yearly).  Continue yearly high dose flu shots (not yet available), in the Fall (Sept/Oct).  Colon cancer screening is recommended.  We again discussed Cologuard. I believe that [employer] now covers this, so we will refer again. You can verify with your insurance and NOT do it if it turns out you will have a very large out of pocket expense.  Please get [place] the dates (or have the pharmacy fax the info) of your Shingrix injections so that we can put it in your chart.  Please resume taking your alendronate/fosamax as we discussed, and discuss any concerns/issues with your GYN at your next visit. We discussed Prolia and Evista as alternatives, if not tolerating  the alendronate (Prolia is preferred).

## 2018-07-07 NOTE — Progress Notes (Signed)
Chief Complaint  Patient presents with  . Medicare Wellness    fasting AWV without pap, no concerns. Sees optho.     Elizabeth Haynes is a 72 y.o. female who presents for annual physical exam, Medicare wellness visit and follow-up on chronic medical conditions.   H/o osteopenia:  Previously took Fosamax for about 5 years (stopped around 2008).  DEXA was 08/2014 and showed decline in hips, T-2.4.  This was last checked 09/2016 by her GYN, showing further decline in all areas, most notably in Spine, where T went from -2.4 to -3.2.  She reports they put her on fosamax, took it for a year, then took herself off of it.  "I do not like that drug".  She had a reaction to oral surgery, had to take antibiotics, which caused GI issues; she thought fosamax also caused GI issues (diarrhea).  She is taking calcium and D supplements She uses weights every other day. She has upcoming appt with Dr. Valentino Saxon in October. Still has a lot of fosamax at home.  H/o hyperlipidemia, improved with dietary changes. Last checked in 03/2015. Lab Results  Component Value Date   CHOL 216 (H) 04/01/2015   HDL 101 04/01/2015   LDLCALC 103 (H) 04/01/2015   TRIG 62 04/01/2015   CHOLHDL 2.1 04/01/2015   Allergies: seasonally in spring and fall.  She uses neti-pot and Flonase when needed, not yet.  She is using Nettle leaf, which is helpful.    Immunization History  Administered Date(s) Administered  . Influenza Split 08/11/2013, 08/16/2015  . Pneumococcal Conjugate-13 04/01/2015  . Pneumococcal Polysaccharide-23 07/15/2012  . Tdap 07/15/2012  . Zoster 11/10/2009  She reports she had Shingrix x 2 last year from pharmacy Last Pap smear: here 03/2015, negative, with no high risk HPV. She sees Dr. Valentino Saxon, (no pap done since then). Last mammogram:  09/2017 Last colonoscopy: never and refuses Last DEXA: 09/2016, T-3.2 at spine. Dentist: twice yearly Ophtho: every other year Exercise: Walks 40 minutes 6x/week. Does  weight routine every other day. Vitamin D screen: 68 in 03/2015  She has healthcare power of attorney and Living Will. Fall screen: negative Depression screen: negative Functional status survey is unremarkable Mini-Cog screen: normal (5)  See full screens in Epic  Other doctors caring for patient include:  GYN: Dr. Pamala Hurry Dentist: Dr. Arita Miss Ophtho: Dr. Kathrin Penner Derm: Dr. Rolm Bookbinder (prn only)  Past Medical History:  Diagnosis Date  . Osteopenia   . Osteoporosis 09/2016  . Rosacea   . Symptomatic menopausal or female climacteric states     Past Surgical History:  Procedure Laterality Date  . TONSILLECTOMY AND ADENOIDECTOMY  age 60  . TUBAL LIGATION      Social History   Socioeconomic History  . Marital status: Single    Spouse name: Not on file  . Number of children: Not on file  . Years of education: Not on file  . Highest education level: Not on file  Occupational History  . Not on file  Social Needs  . Financial resource strain: Not on file  . Food insecurity:    Worry: Not on file    Inability: Not on file  . Transportation needs:    Medical: Not on file    Non-medical: Not on file  Tobacco Use  . Smoking status: Never Smoker  . Smokeless tobacco: Never Used  Substance and Sexual Activity  . Alcohol use: No  . Drug use: No  . Sexual activity: Not Currently  Lifestyle  . Physical activity:    Days per week: Not on file    Minutes per session: Not on file  . Stress: Not on file  Relationships  . Social connections:    Talks on phone: Not on file    Gets together: Not on file    Attends religious service: Not on file    Active member of club or organization: Not on file    Attends meetings of clubs or organizations: Not on file    Relationship status: Not on file  . Intimate partner violence:    Fear of current or ex partner: Not on file    Emotionally abused: Not on file    Physically abused: Not on file    Forced sexual activity:  Not on file  Other Topics Concern  . Not on file  Social History Narrative   Lives with dog.  2 stepchildren ([place]).  Widowed. 8 grandchildren, 72 great grandchildren    Family History  Problem Relation Age of Onset  . Dementia Mother   . Hip fracture Mother        x 2  . Alzheimer's disease Mother   . Hypertension Mother   . Heart disease Mother        pacemaker  . Cancer Father        thyroid,lung,prostate  . Diabetes Other   . Breast cancer Maternal Grandmother   . Heart attack Maternal Grandfather     Outpatient Encounter Medications as of 07/08/2018  Medication Sig Note  . Azelaic Acid (FINACEA EX) Apply topically.   Marland Kitchen b complex vitamins capsule Take 1 capsule by mouth daily.   . calcium carbonate 1250 MG capsule Take 1,250 mg by mouth 2 (two) times daily with a meal. 04/01/2015: She takes calcium+D twice daily (unsure of strength/dose)  . cholecalciferol (VITAMIN D) 1000 UNITS tablet Take 1,000 Units by mouth daily. 07/08/2018: Believes it is 2000 IU, and takes it twice daily  . Multiple Vitamins-Minerals (MULTIVITAMIN WITH MINERALS) tablet Take 1 tablet by mouth daily.   . vitamin E 1000 UNIT capsule Take 1,000 Units by mouth daily.   Marland Kitchen doxycycline (DORYX) 100 MG DR capsule Take 100 mg by mouth as needed. 04/01/2015: Uses prn for rosacea flare, hasn't needed in a while   No facility-administered encounter medications on file as of 07/08/2018.     Allergies  Allergen Reactions  . Bactrim [Sulfamethoxazole-Trimethoprim] Hives    ROS: The patient denies anorexia, fever, weight changes, headaches, vision changes, decreased hearing, ear pain, sore throat, breast concerns, chest pain, palpitations, dizziness, syncope, dyspnea on exertion, cough, swelling, nausea, vomiting, diarrhea, constipation, abdominal pain, melena, hematochezia, indigestion/heartburn, hematuria, incontinence, dysuria, vaginal bleeding, discharge, odor or itch, genital lesions, joint pains,  numbness, tingling, weakness, tremor, suspicious skin lesions, depression, anxiety, abnormal bleeding/bruising, or enlarged lymph nodes.  Sees dermatologist periodically, no mole changes or concerns. Rosacea is well controlled.  No significant hot flashes    PHYSICAL EXAM:  BP 130/70   Pulse 60   Ht 5' 1.5" (1.562 m)   Wt 110 lb 6.4 oz (50.1 kg)   BMI 20.52 kg/m   Wt Readings from Last 3 Encounters:  07/08/18 110 lb 6.4 oz (50.1 kg)  04/01/15 114 lb (51.7 kg)  10/01/13 106 lb (48.1 kg)    General Appearance:   Alert, cooperative, no distress, appears stated age.  Head:   Normocephalic, without obvious abnormality, atraumatic  Eyes:   PERRL, conjunctiva/corneas clear, EOM's intact,  fundi benign  Ears:   Normal TM's and external ear canals  Nose:  Nares normal, nasal mucosa is normal, no drainage. Sinuses nontender  Throat:  Lips, mucosa, and tongue normal; teeth and gums normal.  Neck:  Supple, no lymphadenopathy; thyroid: noenlargement/tenderness/ nodules; no carotid bruit or JVD  Back:  Spine nontender, no curvature, ROM normal, no CVA tenderness  Lungs:   Clear to auscultation bilaterally without wheezes, rales or ronchi; respirations unlabored  Chest Wall:   No tenderness or deformity  Heart:   Regular rate and rhythm, S1 and S2 normal, no murmur, rub or gallop  Breast Exam:   Deferred to GYN  Abdomen:   Soft, non-tender, nondistended, normoactive bowel sounds, no masses, no hepatosplenomegaly  Genitalia:  Deferred to GYN  Rectal:   Deferred to GYN  Extremities:  No clubbing, cyanosis or edema  Pulses:  2+ and symmetric all extremities  Skin:  Skin color, texture, turgor normal. Many large SK's scattered around trunk, arms. Area on left thoracic back--hyperpigmented (eczema--scratched per pt)  Lymph nodes:  Cervical, supraclavicular, and axillary nodes normal  Neurologic:  CNII-XII intact, normal strength,  sensation and gait; reflexes 2+ and symmetric throughout   Psych: Normal mood, affect, hygiene and grooming   EKG: sinus brady, PAC  ASSESSMENT/PLAN:  Annual physical exam  Medicare annual wellness visit, initial  Osteoporosis without current pathological fracture, unspecified osteoporosis type - encouraged her to treat osteoporosis.  Discussed alendronate/bisphosphonate, Prolia, Evista. To further discuss with GYN. re-try alendronate she has - Plan: TSH, VITAMIN D 25 Hydroxy (Vit-D Deficiency, Fractures), Comprehensive metabolic panel  Need for hepatitis C screening test - Plan: Hepatitis C antibody  Hypercholesterolemia - Plan: Lipid panel  Medication monitoring encounter - Plan: VITAMIN D 25 Hydroxy (Vit-D Deficiency, Fractures), CBC with Differential/Platelet, Comprehensive metabolic panel  Irregular heart beat - occasional ectopic beat noted; no baseline EKG in chart - Plan: EKG 12-Lead  Colon cancer screening - refuses colonoscopy.  her insurance may now cover Cologuard, to check (recently did FIT testing, so may need to wait?)   Hep C Ab TSH, lipid, c-met ,CBC Vitamin D   Discussed monthly self breast exams and yearly mammograms; at least 30 minutes of aerobic activity at least 5 days/week, weight bearing exercise at least 2x/week; proper sunscreen use reviewed; healthy diet, including goals of calcium and vitamin D intake and alcohol recommendations (less than or equal to 1 drink/day) reviewed; regular seatbelt use; changing batteries in smoke detectors. Immunization recommendations discussed.Yearly flu shots recommended (high dose); to get [place] dates she had Shingrix. Colon cancer screening recommended.  She refuses colonoscopy.  Previously referred for Cologuard.  This should now be covered by her insurance. Discussed that colonoscopy would be recommended for abnormal cologard, and she understands this.  Full Code, Full Care MOST form  completed  Notes/records from Dr. Julieanne Cotton Attestation I have personally reviewed: The patient's medical and social history Their use of alcohol, tobacco or illicit drugs Their current medications and supplements The patient's functional ability including ADLs,fall risks, home safety risks, cognitive, and hearing and visual impairment Diet and physical activities Evidence for depression or mood disorders  The patient's weight, height and BMI have been recorded in the chart.  I have made referrals, counseling, and provided education to the patient based on review of the above and I have provided the patient with a written personalized care plan for preventive services.

## 2018-07-08 ENCOUNTER — Ambulatory Visit: Payer: Medicare Other | Admitting: Family Medicine

## 2018-07-08 ENCOUNTER — Encounter: Payer: Self-pay | Admitting: Family Medicine

## 2018-07-08 VITALS — BP 130/70 | HR 60 | Ht 61.5 in | Wt 110.4 lb

## 2018-07-08 DIAGNOSIS — Z1211 Encounter for screening for malignant neoplasm of colon: Secondary | ICD-10-CM

## 2018-07-08 DIAGNOSIS — Z1159 Encounter for screening for other viral diseases: Secondary | ICD-10-CM

## 2018-07-08 DIAGNOSIS — E7800 Pure hypercholesterolemia, unspecified: Secondary | ICD-10-CM

## 2018-07-08 DIAGNOSIS — I499 Cardiac arrhythmia, unspecified: Secondary | ICD-10-CM | POA: Diagnosis not present

## 2018-07-08 DIAGNOSIS — M810 Age-related osteoporosis without current pathological fracture: Secondary | ICD-10-CM

## 2018-07-08 DIAGNOSIS — Z5181 Encounter for therapeutic drug level monitoring: Secondary | ICD-10-CM | POA: Diagnosis not present

## 2018-07-08 DIAGNOSIS — Z0000 Encounter for general adult medical examination without abnormal findings: Secondary | ICD-10-CM

## 2018-07-09 LAB — COMPREHENSIVE METABOLIC PANEL
ALT: 23 IU/L (ref 0–32)
AST: 30 IU/L (ref 0–40)
Albumin/Globulin Ratio: 1.4 (ref 1.2–2.2)
Albumin: 4.4 g/dL (ref 3.5–4.8)
Alkaline Phosphatase: 61 IU/L (ref 39–117)
BUN/Creatinine Ratio: 13 (ref 12–28)
BUN: 9 mg/dL (ref 8–27)
Bilirubin Total: 0.6 mg/dL (ref 0.0–1.2)
CALCIUM: 9.7 mg/dL (ref 8.7–10.3)
CO2: 24 mmol/L (ref 20–29)
CREATININE: 0.7 mg/dL (ref 0.57–1.00)
Chloride: 90 mmol/L — ABNORMAL LOW (ref 96–106)
GFR, EST AFRICAN AMERICAN: 101 mL/min/{1.73_m2} (ref >59)
GFR, EST NON AFRICAN AMERICAN: 87 mL/min/{1.73_m2} (ref >59)
GLUCOSE: 98 mg/dL (ref 65–99)
Globulin, Total: 3.1 g/dL (ref 1.5–4.5)
Potassium: 4.1 mmol/L (ref 3.5–5.2)
Sodium: 129 mmol/L — ABNORMAL LOW (ref 134–144)
TOTAL PROTEIN: 7.5 g/dL (ref 6.0–8.5)

## 2018-07-09 LAB — CBC WITH DIFFERENTIAL/PLATELET
BASOS ABS: 0 10*3/uL (ref 0.0–0.2)
BASOS: 0 %
EOS (ABSOLUTE): 0.1 10*3/uL (ref 0.0–0.4)
Eos: 2 %
HEMOGLOBIN: 13.2 g/dL (ref 11.1–15.9)
Hematocrit: 38.1 % (ref 34.0–46.6)
IMMATURE GRANS (ABS): 0 10*3/uL (ref 0.0–0.1)
Immature Granulocytes: 0 %
LYMPHS: 48 %
Lymphocytes Absolute: 2.6 10*3/uL (ref 0.7–3.1)
MCH: 31.2 pg (ref 26.6–33.0)
MCHC: 34.6 g/dL (ref 31.5–35.7)
MCV: 90 fL (ref 79–97)
MONOCYTES: 10 %
Monocytes Absolute: 0.5 10*3/uL (ref 0.1–0.9)
NEUTROS PCT: 40 %
Neutrophils Absolute: 2.2 10*3/uL (ref 1.4–7.0)
PLATELETS: 310 10*3/uL (ref 150–450)
RBC: 4.23 x10E6/uL (ref 3.77–5.28)
RDW: 13.1 % (ref 12.3–15.4)
WBC: 5.5 10*3/uL (ref 3.4–10.8)

## 2018-07-09 LAB — LIPID PANEL
Chol/HDL Ratio: 2.3 ratio (ref 0.0–4.4)
Cholesterol, Total: 223 mg/dL — ABNORMAL HIGH (ref 100–199)
HDL: 96 mg/dL
LDL Calculated: 120 mg/dL — ABNORMAL HIGH (ref 0–99)
Triglycerides: 35 mg/dL (ref 0–149)
VLDL Cholesterol Cal: 7 mg/dL (ref 5–40)

## 2018-07-09 LAB — HEPATITIS C ANTIBODY: Hep C Virus Ab: <0.1 {s_co_ratio} (ref 0.0–0.9)

## 2018-07-09 LAB — TSH: TSH: 1.51 u[IU]/mL (ref 0.450–4.500)

## 2018-07-09 LAB — VITAMIN D 25 HYDROXY (VIT D DEFICIENCY, FRACTURES): VIT D 25 HYDROXY: 76.8 ng/mL (ref 30.0–100.0)

## 2018-07-10 ENCOUNTER — Other Ambulatory Visit: Payer: Self-pay | Admitting: *Deleted

## 2018-07-10 ENCOUNTER — Encounter: Payer: Self-pay | Admitting: *Deleted

## 2018-07-10 DIAGNOSIS — E871 Hypo-osmolality and hyponatremia: Secondary | ICD-10-CM

## 2018-07-10 DIAGNOSIS — Z1211 Encounter for screening for malignant neoplasm of colon: Secondary | ICD-10-CM

## 2018-08-07 ENCOUNTER — Encounter: Payer: Self-pay | Admitting: Family Medicine

## 2018-08-09 ENCOUNTER — Encounter: Payer: Self-pay | Admitting: Family Medicine

## 2018-09-06 ENCOUNTER — Ambulatory Visit: Payer: Medicare Other | Admitting: Family Medicine

## 2018-09-06 ENCOUNTER — Encounter: Payer: Self-pay | Admitting: Family Medicine

## 2018-09-06 VITALS — BP 120/80 | HR 55 | Temp 97.9°F | Wt 110.8 lb

## 2018-09-06 DIAGNOSIS — M25512 Pain in left shoulder: Secondary | ICD-10-CM | POA: Diagnosis not present

## 2018-09-06 DIAGNOSIS — M7918 Myalgia, other site: Secondary | ICD-10-CM | POA: Diagnosis not present

## 2018-09-06 NOTE — Patient Instructions (Signed)
Avoid any strenuous activities with your left shoulder.   Use ice or heat as needed.  Use a topical pain medication such as capsaicin, arnicare gel, biofreeze, etc.  You may take Tylenol if needed since you are not comfortable with Advil or an NSAID.   Follow up if not significantly improving in 1-2 weeks.    Shoulder Exercises Ask your health care provider which exercises are safe for you. Do exercises exactly as told by your health care provider and adjust them as directed. It is normal to feel mild stretching, pulling, tightness, or discomfort as you do these exercises, but you should stop right away if you feel sudden pain or your pain gets worse.Do not begin these exercises until told by your health care provider. RANGE OF MOTION EXERCISES These exercises warm up your muscles and joints and improve the movement and flexibility of your shoulder. These exercises also help to relieve pain, numbness, and tingling. These exercises involve stretching your injured shoulder directly. Exercise A: Pendulum  1. Stand near a wall or a surface that you can hold onto for balance. 2. Bend at the waist and let your left / right arm hang straight down. Use your other arm to support you. Keep your back straight and do not lock your knees. 3. Relax your left / right arm and shoulder muscles, and move your hips and your trunk so your left / right arm swings freely. Your arm should swing because of the motion of your body, not because you are using your arm or shoulder muscles. 4. Keep moving your body so your arm swings in the following directions, as told by your health care provider: ? Side to side. ? Forward and backward. ? In clockwise and counterclockwise circles.

## 2018-09-06 NOTE — Progress Notes (Signed)
   Subjective:    Patient ID: Elizabeth Haynes, female    DOB: [birth year], 72 y.o.   MRN: [id number]  HPI Chief Complaint  Patient presents with  . fell at home    fell at home, walking down stairs and fell and tried to grab the stairs not, not sure if she pulled biceps or not.    She is here with complaints of a one week history of left upper arm pain post fall on her steps at home.  States she had to take her dog outside at 4:30 am last Thursday and her left foot slid on the steps and she "fell to the next step" onto her bottom and grabbed the rail with her left arm. Since then she has noticed persistent dull pain to her left upper arm, no numbness, tingling or weakness. States she has full ROM with her left arm and shoulder. Pain is non radiating.  Denies hitting her head, loc, neck or back pain. No dizziness, chest pain, shortness of breath, N/V/D.   States she has been doing stretch band exercises that her husband does for his shoulder therapy and thinks she made her pain worse.   States she has been using a Materials engineer" from [employer].  Tried ice last night. Has not tried any oral medication for pain, does not like to take medication.   Reviewed allergies, medications, past medical, surgical, family, and social history.   Review of Systems Pertinent positives and negatives in the history of present illness.     Objective:   Physical Exam  Constitutional: She is oriented to person, place, and time. She appears well-developed and well-nourished. No distress.  HENT:  Head: Atraumatic.  Mouth/Throat: Oropharynx is clear and moist.  Eyes: Pupils are equal, round, and reactive to light. Conjunctivae are normal.  Neck: Normal range of motion. Neck supple.  Cardiovascular: Normal rate, regular rhythm and normal heart sounds.  Pulmonary/Chest: Effort normal and breath sounds normal.  Musculoskeletal:       Right shoulder: Normal.       Left shoulder: She exhibits normal  range of motion, no swelling, no crepitus, no pain, normal pulse and normal strength.       Left upper arm: She exhibits tenderness. She exhibits no swelling and no deformity.  TTP at deltoid muscle with normal sensation, ROM and strength of LUE Left shoulder without edema. Normal ROM, negative Neers and Hawkins test, normal lift off, negative drop arm  Neurological: She is alert and oriented to person, place, and time. No cranial nerve deficit. She exhibits normal muscle tone.  Skin: Skin is warm and dry. Capillary refill takes less than 2 seconds. No bruising noted.   BP 120/80   Pulse (!) 55   Temp 97.9 F (36.6 C) (Oral)   Wt 110 lb 12.8 oz (50.3 kg)   BMI 20.60 kg/m       Assessment & Plan:  Pain of left deltoid  Acute pain of left shoulder  Mechanic fall with LUE injury only. No history of falls.  Suspect deltoid muscle strain. No AC joint abnormality. LUE is neurovascularly intact.  Discussed conservative treatment for the next 1-2 weeks and follow up if worsening or not improving. Discussed next step is XR or referral for orthopedist to perform [place]. May also consider PT if needed.

## 2018-09-12 ENCOUNTER — Encounter: Payer: Self-pay | Admitting: Family Medicine

## 2018-09-19 DIAGNOSIS — Z124 Encounter for screening for malignant neoplasm of cervix: Secondary | ICD-10-CM | POA: Diagnosis not present

## 2018-09-19 DIAGNOSIS — M810 Age-related osteoporosis without current pathological fracture: Secondary | ICD-10-CM | POA: Diagnosis not present

## 2018-09-26 ENCOUNTER — Other Ambulatory Visit (INDEPENDENT_AMBULATORY_CARE_PROVIDER_SITE_OTHER): Payer: Medicare Other

## 2018-09-26 DIAGNOSIS — Z23 Encounter for immunization: Secondary | ICD-10-CM | POA: Diagnosis not present

## 2018-10-09 DIAGNOSIS — Z1231 Encounter for screening mammogram for malignant neoplasm of breast: Secondary | ICD-10-CM | POA: Diagnosis not present

## 2018-10-09 DIAGNOSIS — M810 Age-related osteoporosis without current pathological fracture: Secondary | ICD-10-CM | POA: Diagnosis not present

## 2018-10-09 DIAGNOSIS — Z8262 Family history of osteoporosis: Secondary | ICD-10-CM | POA: Diagnosis not present

## 2018-10-09 LAB — HM DEXA SCAN

## 2018-10-09 LAB — HM MAMMOGRAPHY

## 2018-10-10 ENCOUNTER — Other Ambulatory Visit: Payer: Medicare Other

## 2018-10-10 DIAGNOSIS — E871 Hypo-osmolality and hyponatremia: Secondary | ICD-10-CM

## 2018-10-10 LAB — BASIC METABOLIC PANEL
BUN / CREAT RATIO: 19 (ref 12–28)
BUN: 14 mg/dL (ref 8–27)
CO2: 24 mmol/L (ref 20–29)
Calcium: 9.8 mg/dL (ref 8.7–10.3)
Chloride: 94 mmol/L — ABNORMAL LOW (ref 96–106)
Creatinine, Ser: 0.72 mg/dL (ref 0.57–1.00)
GFR, EST AFRICAN AMERICAN: 97 mL/min/{1.73_m2} (ref >59)
GFR, EST NON AFRICAN AMERICAN: 84 mL/min/{1.73_m2} (ref >59)
Glucose: 72 mg/dL (ref 65–99)
POTASSIUM: 4.1 mmol/L (ref 3.5–5.2)
Sodium: 134 mmol/L (ref 134–144)

## 2018-10-14 ENCOUNTER — Encounter: Payer: Self-pay | Admitting: *Deleted

## 2018-12-02 ENCOUNTER — Encounter: Payer: Self-pay | Admitting: Family Medicine

## 2018-12-09 DIAGNOSIS — Z1211 Encounter for screening for malignant neoplasm of colon: Secondary | ICD-10-CM | POA: Diagnosis not present

## 2018-12-09 LAB — COLOGUARD: Cologuard: NEGATIVE

## 2019-01-20 ENCOUNTER — Ambulatory Visit: Payer: Medicare Other | Admitting: Family Medicine

## 2019-04-17 DIAGNOSIS — H25813 Combined forms of age-related cataract, bilateral: Secondary | ICD-10-CM | POA: Diagnosis not present

## 2019-04-17 DIAGNOSIS — H1851 Endothelial corneal dystrophy: Secondary | ICD-10-CM | POA: Diagnosis not present

## 2019-04-17 DIAGNOSIS — H43813 Vitreous degeneration, bilateral: Secondary | ICD-10-CM | POA: Diagnosis not present

## 2019-04-17 DIAGNOSIS — H04123 Dry eye syndrome of bilateral lacrimal glands: Secondary | ICD-10-CM | POA: Diagnosis not present

## 2019-07-10 ENCOUNTER — Ambulatory Visit: Payer: Medicare Other | Admitting: Family Medicine

## 2019-10-13 DIAGNOSIS — Z1231 Encounter for screening mammogram for malignant neoplasm of breast: Secondary | ICD-10-CM | POA: Diagnosis not present

## 2019-10-27 DIAGNOSIS — H04123 Dry eye syndrome of bilateral lacrimal glands: Secondary | ICD-10-CM | POA: Diagnosis not present

## 2019-10-27 DIAGNOSIS — H0100B Unspecified blepharitis left eye, upper and lower eyelids: Secondary | ICD-10-CM | POA: Diagnosis not present

## 2019-10-27 DIAGNOSIS — H0100A Unspecified blepharitis right eye, upper and lower eyelids: Secondary | ICD-10-CM | POA: Diagnosis not present

## 2020-04-14 DIAGNOSIS — L718 Other rosacea: Secondary | ICD-10-CM | POA: Diagnosis not present

## 2020-04-14 DIAGNOSIS — L218 Other seborrheic dermatitis: Secondary | ICD-10-CM | POA: Diagnosis not present

## 2020-06-15 DIAGNOSIS — L718 Other rosacea: Secondary | ICD-10-CM | POA: Diagnosis not present

## 2020-06-15 DIAGNOSIS — L218 Other seborrheic dermatitis: Secondary | ICD-10-CM | POA: Diagnosis not present

## 2020-07-27 DIAGNOSIS — M533 Sacrococcygeal disorders, not elsewhere classified: Secondary | ICD-10-CM | POA: Diagnosis not present

## 2020-07-27 DIAGNOSIS — M545 Low back pain: Secondary | ICD-10-CM | POA: Diagnosis not present

## 2020-07-28 ENCOUNTER — Encounter: Payer: Self-pay | Admitting: Family Medicine

## 2020-07-28 ENCOUNTER — Ambulatory Visit (INDEPENDENT_AMBULATORY_CARE_PROVIDER_SITE_OTHER): Payer: Medicare Other | Admitting: Family Medicine

## 2020-07-28 ENCOUNTER — Other Ambulatory Visit: Payer: Self-pay

## 2020-07-28 VITALS — BP 124/70 | HR 68 | Ht 62.0 in | Wt 100.0 lb

## 2020-07-28 DIAGNOSIS — M533 Sacrococcygeal disorders, not elsewhere classified: Secondary | ICD-10-CM | POA: Diagnosis not present

## 2020-07-28 NOTE — Progress Notes (Signed)
Chief Complaint  Patient presents with  . Fall    fell yesterday in den trying to kill a bug. [place]wood/slab. Went to [facility] last evening-they did Xrays L spine, Sac/coccyx said negative from what they could tell, could be hairline though-they don't really know. She is in a LOT of pain, some trouble urinating, slower stream than normal. Taking tylenol but not really helping. Lying down is only somewhat comfortable position.   Got on a stepstool last night to kill a bug on the ceiling. She lost balance, and fell backwards on her "rear end".  Went to [facility] after hour urgent care.  X-rays were reportedly okay. She was recommended to use donut pillow, lidocaine patches (OTC) and Tylenol 1000mg  every 8 hours, alternating ice and heat. She is scheduled for f/u at [facility] for 9/7. She has not yet gotten the lidocaine patch or pillow.  She has been taking the tylenol every 6 hours, still gets up screaming in pain when she needs to get up.  Feels okay laying flat on the back. Yesterday she had trouble with her urine flow, but last night it seemed much better, normal today. Denies hematuria, dysuria, urgency. She had a small bowel movement this morning, which wasn't painful.  PMH, PSH, SH reviewed  Outpatient Encounter Medications as of 07/28/2020  Medication Sig Note  . Azelaic Acid (FINACEA EX) Apply topically.   09/27/2020 b complex vitamins capsule Take 1 capsule by mouth daily.   . calcium carbonate 1250 MG capsule Take 1,250 mg by mouth 2 (two) times daily with a meal. 04/01/2015: She takes calcium+D twice daily (unsure of strength/dose)  . cholecalciferol (VITAMIN D) 1000 UNITS tablet Take 1,000 Units by mouth daily. 07/08/2018: Believes it is 2000 IU, and takes it twice daily  . Multiple Vitamins-Minerals (MULTIVITAMIN WITH MINERALS) tablet Take 1 tablet by mouth daily.   . vitamin E 1000 UNIT capsule Take 1,000 Units by mouth daily.    No facility-administered encounter medications on  file as of 07/28/2020.   Allergies  Allergen Reactions  . Bactrim [Sulfamethoxazole-Trimethoprim] Hives   ROS: No fever, URI symptoms.  Some sweats/hot related to pain. Occasional constipation, but taking Mg at night has been helping. No nausea, vomiting, abdominal pain. Urinary complaints per HPI, resolved. Denies numbness, tingling, weakness.   Some discomfort in low back, no other joint pains. Moods are good. No bleeding, bruising, rash   PHYSICAL EXAM:  BP 124/70   Pulse 68   Ht 5\' 2"  (1.575 m)   Wt 100 lb (45.4 kg) Comment: per patient  BMI 18.29 kg/m   Well-appearing female, in no discomfort when laying flat, but yelling out with position change and with walking. She is alert and oriented HEENT: conjunctiva and sclera are clear, EOMI. Wearing mask Neck: no lymphadenopathy or mass. No c-spine tenderness Back: no spinal tenderness, no CVA tenderness. nontender at [facility] joint, sacrum, and coccyx to palpation. There is no bruising/ecchymosis. Extremities: no edema Neuro: alert and oriented, normal strength, DTR's 2+ and symmetric.  Gait is slow, in a lot of pain.  ASSESSMENT/PLAN:  Sacral back pain - nontender on exam.  Suspect contusion from fall. Agree with treatment recs from ortho. Tramadol prn severe pain (to call if needed); will retry NSAID cautiously   She recalls once having lip swelling once from advil. Thinks she got hives once from aleve. This was years ago and she can't really recall.   Hasn't seen GYN in 2 years. Doesn't plan to go  back Last CPE here 06/2018  Sign release for pap smear from GYN. Schedule AWV next available (30 mins only), FASTING, and get labs drawn F/u for CPE (30 mins only) 1-4 weeks later (to review results and do physical, possibly pap if needed).  Declines flu shot today, "too much to deal with"   Use the donut pillow and lidocaine patches as recommended by the orthopedist. Continue tylenol (every 8 hours).  I'm hesitant to  recommend anti-inflammatories based on the history of possible allergic reactions you have reported in the past.  You seem unsure if they were allergies to the medications, and are willing to retry.  I would say IF you're going to retry, get Aleve and start at 1 pill twice daily.  If no reactions, but pain isn't controlled, then you can increase to 2 pills twice daily.  You need to take this with food, and cut back on the dose if it bothers your stomach. If you get any hives or allergic reaction, stop it immediately and take an antihistamine (ie zyrtec, benadryl).  If you are unable to take the naproxen/aleve, and/or if pain is still very bad, we briefly discussed using Tramadol (stronger a pain medication), only if needed for severe pain.  Contact us if this is needed.  Best way to reach out (given the holiday weekend) would be via [employer].

## 2020-07-28 NOTE — Patient Instructions (Signed)
Use the donut pillow and lidocaine patches as recommended by the orthopedist. Continue tylenol (every 8 hours).  I'm hesitant to recommend anti-inflammatories based on the history of possible allergic reactions you have reported in the past.  You seem unsure if they were allergies to the medications, and are willing to retry.  I would say IF you're going to retry, get Aleve and start at 1 pill twice daily.  If no reactions, but pain isn't controlled, then you can increase to 2 pills twice daily.  You need to take this with food, and cut back on the dose if it bothers your stomach. If you get any hives or allergic reaction, stop it immediately and take an antihistamine (ie zyrtec, benadryl).  If you are unable to take the naproxen/aleve, and/or if pain is still very bad, we briefly discussed using Tramadol (stronger a pain medication), only if needed for severe pain.  Contact us if this is needed.  Best way to reach out (given the holiday weekend) would be via [employer].   Be sure to get your high dose flu shot sometimes before November

## 2020-07-29 ENCOUNTER — Telehealth: Payer: Self-pay | Admitting: Family Medicine

## 2020-07-29 ENCOUNTER — Telehealth: Payer: Self-pay | Admitting: *Deleted

## 2020-07-29 MED ORDER — TRAMADOL HCL 50 MG PO TABS: 50.0000 mg | ORAL_TABLET | Freq: Four times a day (QID) | ORAL | 0 refills | Status: AC | PRN

## 2020-07-29 NOTE — Telephone Encounter (Signed)
Patient advised.

## 2020-07-29 NOTE — Telephone Encounter (Signed)
Without symptoms of numbness/weakness or pain radiating, I'm not going to order an MRI prior.  I will leave it up to sports medicine to determine if any additional imaging is needed

## 2020-07-29 NOTE — Telephone Encounter (Signed)
Pt called and states that she is still in so much pain the aleve and tynelo is not helping she is been rotating them but it is not helping states her pain is a level 10 pt would like to see if she can get something stronger,  She uses [facility] [place] [facility] [address]

## 2020-07-29 NOTE — Telephone Encounter (Signed)
Patient called and is seeing Dr. Clementeen Graham at [facility] on Tuesday. She feels like she should have an MRI prior to seeing him and is asking if you would order an MRI. Please advise.

## 2020-07-29 NOTE — Telephone Encounter (Signed)
Advise pt that I sent in Tramadol to [facility]

## 2020-07-30 ENCOUNTER — Other Ambulatory Visit: Payer: Self-pay

## 2020-07-30 ENCOUNTER — Telehealth: Payer: Self-pay

## 2020-07-30 ENCOUNTER — Telehealth: Payer: Self-pay | Admitting: Family Medicine

## 2020-07-30 DIAGNOSIS — T40605A Adverse effect of unspecified narcotics, initial encounter: Secondary | ICD-10-CM | POA: Diagnosis not present

## 2020-07-30 DIAGNOSIS — K5903 Drug induced constipation: Secondary | ICD-10-CM | POA: Diagnosis not present

## 2020-07-30 DIAGNOSIS — G8911 Acute pain due to trauma: Secondary | ICD-10-CM | POA: Diagnosis not present

## 2020-07-30 DIAGNOSIS — Z882 Allergy status to sulfonamides status: Secondary | ICD-10-CM | POA: Diagnosis not present

## 2020-07-30 DIAGNOSIS — Z743 Need for continuous supervision: Secondary | ICD-10-CM | POA: Diagnosis not present

## 2020-07-30 DIAGNOSIS — M545 Low back pain: Secondary | ICD-10-CM | POA: Diagnosis not present

## 2020-07-30 DIAGNOSIS — R300 Dysuria: Secondary | ICD-10-CM | POA: Diagnosis not present

## 2020-07-30 DIAGNOSIS — Z043 Encounter for examination and observation following other accident: Secondary | ICD-10-CM | POA: Diagnosis not present

## 2020-07-30 DIAGNOSIS — R279 Unspecified lack of coordination: Secondary | ICD-10-CM | POA: Diagnosis not present

## 2020-07-30 DIAGNOSIS — R0902 Hypoxemia: Secondary | ICD-10-CM | POA: Diagnosis not present

## 2020-07-30 DIAGNOSIS — S322XXA Fracture of coccyx, initial encounter for closed fracture: Secondary | ICD-10-CM | POA: Diagnosis not present

## 2020-07-30 DIAGNOSIS — S32059D Unspecified fracture of fifth lumbar vertebra, subsequent encounter for fracture with routine healing: Secondary | ICD-10-CM | POA: Diagnosis not present

## 2020-07-30 DIAGNOSIS — M810 Age-related osteoporosis without current pathological fracture: Secondary | ICD-10-CM | POA: Diagnosis not present

## 2020-07-30 DIAGNOSIS — Z87891 Personal history of nicotine dependence: Secondary | ICD-10-CM | POA: Diagnosis not present

## 2020-07-30 DIAGNOSIS — B9620 Unspecified Escherichia coli [E. coli] as the cause of diseases classified elsewhere: Secondary | ICD-10-CM | POA: Diagnosis not present

## 2020-07-30 DIAGNOSIS — E871 Hypo-osmolality and hyponatremia: Secondary | ICD-10-CM | POA: Diagnosis not present

## 2020-07-30 DIAGNOSIS — S3210XD Unspecified fracture of sacrum, subsequent encounter for fracture with routine healing: Secondary | ICD-10-CM | POA: Diagnosis not present

## 2020-07-30 DIAGNOSIS — Z885 Allergy status to narcotic agent status: Secondary | ICD-10-CM | POA: Diagnosis not present

## 2020-07-30 DIAGNOSIS — N390 Urinary tract infection, site not specified: Secondary | ICD-10-CM | POA: Diagnosis not present

## 2020-07-30 DIAGNOSIS — R52 Pain, unspecified: Secondary | ICD-10-CM | POA: Diagnosis not present

## 2020-07-30 DIAGNOSIS — R404 Transient alteration of awareness: Secondary | ICD-10-CM | POA: Diagnosis not present

## 2020-07-30 DIAGNOSIS — R2681 Unsteadiness on feet: Secondary | ICD-10-CM | POA: Diagnosis not present

## 2020-07-30 DIAGNOSIS — W108XXA Fall (on) (from) other stairs and steps, initial encounter: Secondary | ICD-10-CM | POA: Diagnosis not present

## 2020-07-30 DIAGNOSIS — W19XXXA Unspecified fall, initial encounter: Secondary | ICD-10-CM | POA: Diagnosis not present

## 2020-07-30 DIAGNOSIS — K5900 Constipation, unspecified: Secondary | ICD-10-CM | POA: Diagnosis not present

## 2020-07-30 DIAGNOSIS — S3210XA Unspecified fracture of sacrum, initial encounter for closed fracture: Secondary | ICD-10-CM | POA: Diagnosis not present

## 2020-07-30 DIAGNOSIS — Z7409 Other reduced mobility: Secondary | ICD-10-CM | POA: Diagnosis not present

## 2020-07-30 DIAGNOSIS — R531 Weakness: Secondary | ICD-10-CM | POA: Diagnosis not present

## 2020-07-30 DIAGNOSIS — S32059A Unspecified fracture of fifth lumbar vertebra, initial encounter for closed fracture: Secondary | ICD-10-CM | POA: Diagnosis not present

## 2020-07-30 NOTE — Telephone Encounter (Signed)
[facility] called to inform that patient was at the hospital due to L5 and Sacral fracture. They are requesting we refer patient to home health because their referral person is not there today.

## 2020-07-30 NOTE — Telephone Encounter (Signed)
ER notes from [facility] hospital reviewed.  Okay for [facility] referral, please do.  (pt seen here this week, but we didn't have the ortho records from her ortho UC visit, and had ortho f/u already scheduled)

## 2020-07-30 NOTE — Progress Notes (Unsigned)
na

## 2020-07-30 NOTE — Telephone Encounter (Signed)
Patient and her neighbor called me (on call) to request home health assistance for hospital bed and nursing assistance due to recent fall and pelvic/low back injury. Reviewed patient's chart and an order had been placed this afternoon for [facility]. Called the patient back to tell her the news.

## 2020-08-03 ENCOUNTER — Telehealth: Payer: Self-pay | Admitting: Family Medicine

## 2020-08-03 ENCOUNTER — Ambulatory Visit: Payer: Self-pay | Admitting: Family Medicine

## 2020-08-03 NOTE — Progress Notes (Deleted)
    Subjective:    CC: Sacral fracture  I, Christoper Fabian, LAT, ATC, am serving as scribe for Dr. Clementeen Graham.  HPI: Pt is a 74 y/o female presenting w/ c/o sacral pain due to a sacral fracture she suffered on 07/28/20 when she fell and landed on her buttocks.  Pertinent review of Systems: ***  Relevant historical information: ***   Objective:   There were no vitals filed for this visit. General: Well Developed, well nourished, and in no acute distress.   MSK: ***  Lab and Radiology Results No results found for this or any previous visit (from the past 72 hour(s)). No results found.    Impression and Recommendations:    Assessment and Plan: 74 y.o. female with ***.  PDMP not reviewed this encounter. No orders of the defined types were placed in this encounter.  No orders of the defined types were placed in this encounter.   Discussed warning signs or symptoms. Please see discharge instructions. Patient expresses understanding.   ***

## 2020-08-03 NOTE — Telephone Encounter (Signed)
Home Health requested from [place].  Faxed info to [phone].  Vickie requested for our patient.

## 2020-08-04 DIAGNOSIS — N390 Urinary tract infection, site not specified: Secondary | ICD-10-CM | POA: Diagnosis not present

## 2020-08-04 DIAGNOSIS — S32059D Unspecified fracture of fifth lumbar vertebra, subsequent encounter for fracture with routine healing: Secondary | ICD-10-CM | POA: Diagnosis not present

## 2020-08-04 DIAGNOSIS — S3210XA Unspecified fracture of sacrum, initial encounter for closed fracture: Secondary | ICD-10-CM | POA: Diagnosis not present

## 2020-08-04 DIAGNOSIS — E569 Vitamin deficiency, unspecified: Secondary | ICD-10-CM | POA: Diagnosis not present

## 2020-08-04 DIAGNOSIS — W108XXA Fall (on) (from) other stairs and steps, initial encounter: Secondary | ICD-10-CM | POA: Diagnosis not present

## 2020-08-04 DIAGNOSIS — Z743 Need for continuous supervision: Secondary | ICD-10-CM | POA: Diagnosis not present

## 2020-08-04 DIAGNOSIS — K5900 Constipation, unspecified: Secondary | ICD-10-CM | POA: Diagnosis not present

## 2020-08-04 DIAGNOSIS — R279 Unspecified lack of coordination: Secondary | ICD-10-CM | POA: Diagnosis not present

## 2020-08-04 DIAGNOSIS — M545 Low back pain: Secondary | ICD-10-CM | POA: Diagnosis not present

## 2020-08-04 DIAGNOSIS — E871 Hypo-osmolality and hyponatremia: Secondary | ICD-10-CM | POA: Diagnosis not present

## 2020-08-04 DIAGNOSIS — I10 Essential (primary) hypertension: Secondary | ICD-10-CM | POA: Diagnosis not present

## 2020-08-04 DIAGNOSIS — B9620 Unspecified Escherichia coli [E. coli] as the cause of diseases classified elsewhere: Secondary | ICD-10-CM | POA: Diagnosis not present

## 2020-08-04 DIAGNOSIS — R102 Pelvic and perineal pain: Secondary | ICD-10-CM | POA: Diagnosis not present

## 2020-08-04 DIAGNOSIS — Z79899 Other long term (current) drug therapy: Secondary | ICD-10-CM | POA: Diagnosis not present

## 2020-08-04 DIAGNOSIS — D649 Anemia, unspecified: Secondary | ICD-10-CM | POA: Diagnosis not present

## 2020-08-04 DIAGNOSIS — R2681 Unsteadiness on feet: Secondary | ICD-10-CM | POA: Diagnosis not present

## 2020-08-04 DIAGNOSIS — F4320 Adjustment disorder, unspecified: Secondary | ICD-10-CM | POA: Diagnosis not present

## 2020-08-04 DIAGNOSIS — S3210XD Unspecified fracture of sacrum, subsequent encounter for fracture with routine healing: Secondary | ICD-10-CM | POA: Diagnosis not present

## 2020-08-04 DIAGNOSIS — R300 Dysuria: Secondary | ICD-10-CM | POA: Diagnosis not present

## 2020-08-04 DIAGNOSIS — F418 Other specified anxiety disorders: Secondary | ICD-10-CM | POA: Diagnosis not present

## 2020-08-04 DIAGNOSIS — R0902 Hypoxemia: Secondary | ICD-10-CM | POA: Diagnosis not present

## 2020-08-04 DIAGNOSIS — F341 Dysthymic disorder: Secondary | ICD-10-CM | POA: Diagnosis not present

## 2020-08-04 DIAGNOSIS — K5903 Drug induced constipation: Secondary | ICD-10-CM | POA: Diagnosis not present

## 2020-08-21 ENCOUNTER — Other Ambulatory Visit: Payer: Self-pay | Admitting: Family Medicine

## 2020-08-21 MED ORDER — CIPROFLOXACIN HCL 500 MG PO TABS: 500.0000 mg | ORAL_TABLET | Freq: Two times a day (BID) | ORAL | 0 refills | Status: DC

## 2020-08-21 NOTE — Progress Notes (Signed)
Still having UTI symptoms is spite of Ceftin.I will switch to Cipro, take Azostandard and folow up with Dr Oscar La

## 2020-08-22 ENCOUNTER — Encounter (HOSPITAL_COMMUNITY): Payer: Self-pay | Admitting: Internal Medicine

## 2020-08-22 ENCOUNTER — Emergency Department (HOSPITAL_COMMUNITY): Payer: Medicare Other

## 2020-08-22 ENCOUNTER — Other Ambulatory Visit: Payer: Self-pay

## 2020-08-22 ENCOUNTER — Inpatient Hospital Stay (HOSPITAL_COMMUNITY)
Admission: EM | Admit: 2020-08-22 | Discharge: 2020-08-27 | DRG: 357 | Disposition: A | Discharge status: Home Health | Payer: Medicare Other | Attending: Internal Medicine | Admitting: Internal Medicine

## 2020-08-22 DIAGNOSIS — K264 Chronic or unspecified duodenal ulcer with hemorrhage: Principal | ICD-10-CM | POA: Diagnosis present

## 2020-08-22 DIAGNOSIS — I340 Nonrheumatic mitral (valve) insufficiency: Secondary | ICD-10-CM | POA: Diagnosis not present

## 2020-08-22 DIAGNOSIS — Z820 Family history of epilepsy and other diseases of the nervous system: Secondary | ICD-10-CM | POA: Diagnosis not present

## 2020-08-22 DIAGNOSIS — Z79899 Other long term (current) drug therapy: Secondary | ICD-10-CM | POA: Diagnosis not present

## 2020-08-22 DIAGNOSIS — Z681 Body mass index (BMI) 19 or less, adult: Secondary | ICD-10-CM

## 2020-08-22 DIAGNOSIS — D649 Anemia, unspecified: Secondary | ICD-10-CM

## 2020-08-22 DIAGNOSIS — K259 Gastric ulcer, unspecified as acute or chronic, without hemorrhage or perforation: Secondary | ICD-10-CM | POA: Diagnosis not present

## 2020-08-22 DIAGNOSIS — D62 Acute posthemorrhagic anemia: Secondary | ICD-10-CM | POA: Diagnosis present

## 2020-08-22 DIAGNOSIS — K269 Duodenal ulcer, unspecified as acute or chronic, without hemorrhage or perforation: Secondary | ICD-10-CM

## 2020-08-22 DIAGNOSIS — Z885 Allergy status to narcotic agent status: Secondary | ICD-10-CM | POA: Diagnosis not present

## 2020-08-22 DIAGNOSIS — Z8249 Family history of ischemic heart disease and other diseases of the circulatory system: Secondary | ICD-10-CM

## 2020-08-22 DIAGNOSIS — N390 Urinary tract infection, site not specified: Secondary | ICD-10-CM | POA: Diagnosis present

## 2020-08-22 DIAGNOSIS — Z803 Family history of malignant neoplasm of breast: Secondary | ICD-10-CM

## 2020-08-22 DIAGNOSIS — R531 Weakness: Secondary | ICD-10-CM | POA: Diagnosis not present

## 2020-08-22 DIAGNOSIS — M810 Age-related osteoporosis without current pathological fracture: Secondary | ICD-10-CM | POA: Diagnosis not present

## 2020-08-22 DIAGNOSIS — E871 Hypo-osmolality and hyponatremia: Secondary | ICD-10-CM | POA: Diagnosis not present

## 2020-08-22 DIAGNOSIS — K922 Gastrointestinal hemorrhage, unspecified: Secondary | ICD-10-CM | POA: Diagnosis present

## 2020-08-22 DIAGNOSIS — Z9851 Tubal ligation status: Secondary | ICD-10-CM

## 2020-08-22 DIAGNOSIS — Z833 Family history of diabetes mellitus: Secondary | ICD-10-CM

## 2020-08-22 DIAGNOSIS — N951 Menopausal and female climacteric states: Secondary | ICD-10-CM | POA: Diagnosis present

## 2020-08-22 DIAGNOSIS — L719 Rosacea, unspecified: Secondary | ICD-10-CM | POA: Diagnosis present

## 2020-08-22 DIAGNOSIS — K921 Melena: Secondary | ICD-10-CM | POA: Diagnosis not present

## 2020-08-22 DIAGNOSIS — Z20822 Contact with and (suspected) exposure to covid-19: Secondary | ICD-10-CM | POA: Diagnosis not present

## 2020-08-22 DIAGNOSIS — K219 Gastro-esophageal reflux disease without esophagitis: Secondary | ICD-10-CM | POA: Diagnosis not present

## 2020-08-22 DIAGNOSIS — Z882 Allergy status to sulfonamides status: Secondary | ICD-10-CM

## 2020-08-22 DIAGNOSIS — E440 Moderate protein-calorie malnutrition: Secondary | ICD-10-CM | POA: Insufficient documentation

## 2020-08-22 DIAGNOSIS — E860 Dehydration: Secondary | ICD-10-CM | POA: Diagnosis present

## 2020-08-22 DIAGNOSIS — R000 Tachycardia, unspecified: Secondary | ICD-10-CM | POA: Diagnosis not present

## 2020-08-22 DIAGNOSIS — K254 Chronic or unspecified gastric ulcer with hemorrhage: Secondary | ICD-10-CM | POA: Diagnosis not present

## 2020-08-22 DIAGNOSIS — I471 Supraventricular tachycardia: Secondary | ICD-10-CM | POA: Diagnosis not present

## 2020-08-22 DIAGNOSIS — R519 Headache, unspecified: Secondary | ICD-10-CM | POA: Diagnosis present

## 2020-08-22 DIAGNOSIS — R0902 Hypoxemia: Secondary | ICD-10-CM | POA: Diagnosis not present

## 2020-08-22 DIAGNOSIS — K2950 Unspecified chronic gastritis without bleeding: Secondary | ICD-10-CM | POA: Diagnosis not present

## 2020-08-22 DIAGNOSIS — R55 Syncope and collapse: Secondary | ICD-10-CM | POA: Diagnosis present

## 2020-08-22 DIAGNOSIS — J309 Allergic rhinitis, unspecified: Secondary | ICD-10-CM | POA: Diagnosis not present

## 2020-08-22 DIAGNOSIS — Z66 Do not resuscitate: Secondary | ICD-10-CM | POA: Diagnosis present

## 2020-08-22 DIAGNOSIS — G319 Degenerative disease of nervous system, unspecified: Secondary | ICD-10-CM | POA: Diagnosis not present

## 2020-08-22 DIAGNOSIS — I6782 Cerebral ischemia: Secondary | ICD-10-CM | POA: Diagnosis not present

## 2020-08-22 DIAGNOSIS — I959 Hypotension, unspecified: Secondary | ICD-10-CM | POA: Diagnosis not present

## 2020-08-22 DIAGNOSIS — K279 Peptic ulcer, site unspecified, unspecified as acute or chronic, without hemorrhage or perforation: Secondary | ICD-10-CM | POA: Diagnosis not present

## 2020-08-22 DIAGNOSIS — K3189 Other diseases of stomach and duodenum: Secondary | ICD-10-CM | POA: Diagnosis not present

## 2020-08-22 DIAGNOSIS — E876 Hypokalemia: Secondary | ICD-10-CM | POA: Diagnosis not present

## 2020-08-22 DIAGNOSIS — Z95828 Presence of other vascular implants and grafts: Secondary | ICD-10-CM | POA: Diagnosis not present

## 2020-08-22 DIAGNOSIS — K260 Acute duodenal ulcer with hemorrhage: Secondary | ICD-10-CM | POA: Diagnosis not present

## 2020-08-22 LAB — BASIC METABOLIC PANEL
Anion gap: 9 (ref 5–15)
BUN: 48 mg/dL — ABNORMAL HIGH (ref 8–23)
CO2: 27 mmol/L (ref 22–32)
Calcium: 8.8 mg/dL — ABNORMAL LOW (ref 8.9–10.3)
Chloride: 92 mmol/L — ABNORMAL LOW (ref 98–111)
Creatinine, Ser: 0.62 mg/dL (ref 0.44–1.00)
GFR calc Af Amer: >60 mL/min (ref >60)
GFR calc non Af Amer: >60 mL/min (ref >60)
Glucose, Bld: 120 mg/dL — ABNORMAL HIGH (ref 70–99)
Potassium: 4.4 mmol/L (ref 3.5–5.1)
Sodium: 128 mmol/L — ABNORMAL LOW (ref 135–145)

## 2020-08-22 LAB — IRON AND TIBC
Iron: 65 ug/dL (ref 28–170)
Saturation Ratios: 28 % (ref 10.4–31.8)
TIBC: 236 ug/dL — ABNORMAL LOW (ref 250–450)
UIBC: 171 ug/dL

## 2020-08-22 LAB — RESPIRATORY PANEL BY RT PCR (FLU A&B, COVID)
Influenza A by PCR: NEGATIVE
Influenza B by PCR: NEGATIVE
SARS Coronavirus 2 by RT PCR: NEGATIVE

## 2020-08-22 LAB — CBC WITH DIFFERENTIAL/PLATELET
Abs Immature Granulocytes: 0.09 10*3/uL — ABNORMAL HIGH (ref 0.00–0.07)
Basophils Absolute: 0 10*3/uL (ref 0.0–0.1)
Basophils Relative: 0 %
Eosinophils Absolute: 0 10*3/uL (ref 0.0–0.5)
Eosinophils Relative: 0 %
HCT: 16.6 % — ABNORMAL LOW (ref 36.0–46.0)
Hemoglobin: 5.6 g/dL — CL (ref 12.0–15.0)
Immature Granulocytes: 1 %
Lymphocytes Relative: 14 %
Lymphs Abs: 1.4 10*3/uL (ref 0.7–4.0)
MCH: 30.9 pg (ref 26.0–34.0)
MCHC: 33.7 g/dL (ref 30.0–36.0)
MCV: 91.7 fL (ref 80.0–100.0)
Monocytes Absolute: 0.6 10*3/uL (ref 0.1–1.0)
Monocytes Relative: 6 %
Neutro Abs: 7.6 10*3/uL (ref 1.7–7.7)
Neutrophils Relative %: 79 %
Platelets: 485 10*3/uL — ABNORMAL HIGH (ref 150–400)
RBC: 1.81 MIL/uL — ABNORMAL LOW (ref 3.87–5.11)
RDW: 12 % (ref 11.5–15.5)
WBC: 9.7 10*3/uL (ref 4.0–10.5)
nRBC: 0 % (ref 0.0–0.2)

## 2020-08-22 LAB — POC OCCULT BLOOD, ED: Fecal Occult Bld: POSITIVE — AB

## 2020-08-22 LAB — PREPARE RBC (CROSSMATCH)

## 2020-08-22 LAB — ABO/RH: ABO/RH(D): A POS

## 2020-08-22 MED ORDER — SODIUM CHLORIDE 0.9 % IV SOLN: 10.0000 mL/h | Freq: Once | INTRAVENOUS | Status: DC

## 2020-08-22 MED ORDER — ACETAMINOPHEN 325 MG PO TABS
650.0000 mg | ORAL_TABLET | Freq: Four times a day (QID) | ORAL | Status: DC | PRN
  Administered 2020-08-22 – 2020-08-27 (×5): 650 mg via ORAL
  Filled 2020-08-22 (×7): qty 2

## 2020-08-22 MED ORDER — PANTOPRAZOLE SODIUM 40 MG IV SOLR
40.0000 mg | Freq: Two times a day (BID) | INTRAVENOUS | Status: DC
  Administered 2020-08-23: 40 mg via INTRAVENOUS
  Filled 2020-08-22: qty 40

## 2020-08-22 MED ORDER — SODIUM CHLORIDE 0.9 % IV BOLUS
1000.0000 mL | Freq: Once | INTRAVENOUS | Status: AC
  Administered 2020-08-22: 1000 mL via INTRAVENOUS

## 2020-08-22 MED ORDER — ONDANSETRON HCL 4 MG PO TABS: 4.0000 mg | ORAL_TABLET | Freq: Four times a day (QID) | ORAL | Status: DC | PRN

## 2020-08-22 MED ORDER — PANTOPRAZOLE SODIUM 40 MG IV SOLR
40.0000 mg | Freq: Once | INTRAVENOUS | Status: AC
  Administered 2020-08-22: 40 mg via INTRAVENOUS
  Filled 2020-08-22: qty 40

## 2020-08-22 MED ORDER — CIPROFLOXACIN HCL 500 MG PO TABS
500.0000 mg | ORAL_TABLET | Freq: Two times a day (BID) | ORAL | Status: AC
  Administered 2020-08-22 – 2020-08-23 (×2): 500 mg via ORAL
  Filled 2020-08-22 (×3): qty 1

## 2020-08-22 MED ORDER — ONDANSETRON HCL 4 MG/2ML IJ SOLN: 4.0000 mg | Freq: Four times a day (QID) | INTRAMUSCULAR | Status: DC | PRN

## 2020-08-22 MED ORDER — ACETAMINOPHEN 650 MG RE SUPP: 650.0000 mg | Freq: Four times a day (QID) | RECTAL | Status: DC | PRN

## 2020-08-22 NOTE — H&P (Addendum)
History and Physical    Elizabeth Haynes IBB:[id number] DOB: 08/16/1946 DOA: 08/22/2020  PCP: Joselyn Arrow, MD  Patient coming from: Home  Chief Complaint: Falls and fatigue  HPI: Elizabeth Haynes is a 74 y.o. female with medical history significant of osteoporosis. Presents with fatigue. Recent discharge from rehab facility. In normal state of health until 3 days ago when she noted some increased fatigue and difficulty getting around. This continued through yesterday when she started to fall but was caught by her neighbor. She states that at the time she was heading to the bathroom to have a BM because her stomach was hurting. Her neighbors became concerned about her progressive decline and decided to bring her to the ED today.   Of note, she started a new treatment for UTI yesterday after being on ceftin for several days with no relief of symptoms. She states her symptoms seem to have improved on cipro.   ED Course: Work up revealed an Hgb of 5.6. She was FOBT positive. GI was called for eval. TRH was called for admission.   Review of Systems:  She denies CP, N, V, palpitations. Review of systems is otherwise negative for all not mentioned in HPI.   Past Medical History:  Diagnosis Date  . Osteopenia   . Osteoporosis 09/2016  . Rosacea   . Symptomatic menopausal or female climacteric states     Past Surgical History:  Procedure Laterality Date  . TONSILLECTOMY AND ADENOIDECTOMY  age 6  . TUBAL LIGATION       reports that she has never smoked. She has never used smokeless tobacco. She reports that she does not drink alcohol and does not use drugs.  Allergies  Allergen Reactions  . Tramadol Other (See Comments)    confusion  . Bactrim [Sulfamethoxazole-Trimethoprim] Hives    Family History  Problem Relation Age of Onset  . Dementia Mother   . Hip fracture Mother        x 2  . Alzheimer's disease Mother   . Hypertension Mother   . Heart disease Mother        pacemaker  .  Cancer Father        thyroid,lung,prostate  . Diabetes Other   . Breast cancer Maternal Grandmother   . Heart attack Maternal Grandfather     Prior to Admission medications   Medication Sig Start Date End Date Taking? Authorizing Provider  Azelaic Acid (FINACEA EX) Apply topically.   Yes [provider]  b complex vitamins capsule Take 1 capsule by mouth daily.   Yes [provider]  calcium carbonate 1250 MG capsule Take 1,250 mg by mouth 2 (two) times daily with a meal.   Yes [provider]  cholecalciferol (VITAMIN D) 1000 UNITS tablet Take 1,000 Units by mouth daily.   Yes [provider]  ciprofloxacin (CIPRO) 500 MG tablet Take 1 tablet (500 mg total) by mouth 2 (two) times daily. 08/21/20  Yes Ronnald Nian, MD  HYDROcodone-acetaminophen (NORCO/VICODIN) 5-325 MG tablet Take 1 tablet by mouth 4 (four) times daily as needed for pain. 07/30/20  Yes [provider]  Multiple Vitamins-Minerals (MULTIVITAMIN WITH MINERALS) tablet Take 1 tablet by mouth daily.   Yes [provider]  olopatadine (PATADAY) 0.1 % ophthalmic solution Place 1 drop into both eyes daily as needed for allergies.   Yes [provider]  vitamin E 1000 UNIT capsule Take 1,000 Units by mouth daily.   Yes [provider]  Physical Exam: Vitals:   08/22/20 1253 08/22/20 1300 08/22/20 1330 08/22/20 1400  BP: (!) 96/51 (!) 100/54 (!) 100/41 (!) 100/55  Pulse: 86  88 91  Resp: 15 17 20 18   Temp:      TempSrc:      SpO2: 100%  100% 100%    General: 74 y.o. female resting in bed in NAD Eyes: PERRL, normal sclera ENMT: Nares patent w/o discharge, orophaynx clear, dentition normal, ears w/o discharge/lesions/ulcers Neck: trachea midline Cardiovascular: RRR, +S1, S2, no g/r, 2/6 SEM equal pulses throughout Respiratory: CTABL, no w/r/r, normal WOB GI: BS+, NDNT, no masses noted, no organomegaly noted MSK: No e/c/c Skin: No rashes, bruises,  ulcerations noted Neuro: A&O x 3, no focal deficits Psyc: Appropriate interaction and affect, calm/cooperative  Labs on Admission: I have personally reviewed following labs and imaging studies  CBC: Recent Labs  Lab 08/22/20 1030  WBC 9.7  NEUTROABS 7.6  HGB 5.6*  HCT 16.6*  MCV 91.7  PLT 485*   Basic Metabolic Panel: Recent Labs  Lab 08/22/20 1030  NA 128*  K 4.4  CL 92*  CO2 27  GLUCOSE 120*  BUN 48*  CREATININE 0.62  CALCIUM 8.8*   GFR: CrCl cannot be calculated (Unknown ideal weight.). Liver Function Tests: No results for input(s): AST, ALT, ALKPHOS, BILITOT, PROT, ALBUMIN in the last 168 hours. No results for input(s): LIPASE, AMYLASE in the last 168 hours. No results for input(s): AMMONIA in the last 168 hours. Coagulation Profile: No results for input(s): INR, PROTIME in the last 168 hours. Cardiac Enzymes: No results for input(s): CKTOTAL, CKMB, CKMBINDEX, TROPONINI in the last 168 hours. BNP (last 3 results) No results for input(s): PROBNP in the last 8760 hours. HbA1C: No results for input(s): HGBA1C in the last 72 hours. CBG: No results for input(s): GLUCAP in the last 168 hours. Lipid Profile: No results for input(s): CHOL, HDL, LDLCALC, TRIG, CHOLHDL, LDLDIRECT in the last 72 hours. Thyroid Function Tests: No results for input(s): TSH, T4TOTAL, FREET4, T3FREE, THYROIDAB in the last 72 hours. Anemia Panel: No results for input(s): VITAMINB12, FOLATE, FERRITIN, TIBC, IRON, RETICCTPCT in the last 72 hours. Urine analysis:    Component Value Date/Time   BILIRUBINUR neg 04/01/2015 0910   PROTEINUR neg 04/01/2015 0910   UROBILINOGEN negative 04/01/2015 0910   NITRITE neg 04/01/2015 0910   LEUKOCYTESUR Negative 04/01/2015 0910    Radiological Exams on Admission: DG Chest 2 View  Result Date: 08/22/2020 CLINICAL DATA:  Syncope. EXAM: CHEST - 2 VIEW COMPARISON:  None. FINDINGS: Cardiomediastinal silhouette is normal. Mediastinal contours appear  intact. There is no evidence of focal airspace consolidation, pleural effusion or pneumothorax. Osseous structures are without acute abnormality. Soft tissues are grossly normal. IMPRESSION: No active cardiopulmonary disease. Electronically Signed   By: 08/24/2020 M.D.   On: 08/22/2020 11:52   CT Head Wo Contrast  Result Date: 08/22/2020 CLINICAL DATA:  Recent syncopal episode EXAM: CT HEAD WITHOUT CONTRAST TECHNIQUE: Contiguous axial images were obtained from the base of the skull through the vertex without intravenous contrast. COMPARISON:  None. FINDINGS: Brain: Mild atrophic changes and chronic white matter ischemic changes are noted. No findings to suggest acute hemorrhage, acute infarction or space-occupying mass lesion are noted. Vascular: No hyperdense vessel or unexpected calcification. Skull: Normal. Negative for fracture or focal lesion. Sinuses/Orbits: No acute finding. Other: None. IMPRESSION: Chronic atrophic and ischemic changes without acute abnormality. Electronically Signed   By: 08/24/2020 M.D.   On: 08/22/2020  12:05    EKG: Independently reviewed. Sinus tach  Assessment/Plan GIB Symptomatic anemia (normocytic)     - admit to inpt, telemetry     - 2 units pRBCs ordered; follow up CBC after last unit; q6h H&H starting this evening     - protonix     - GI consulted, appreciate assistance, EGD tomorrow, possible c-scope next day     - CLD today, but NPO pMN     - iron studies before blood if possible.   UTI     - being treated outpt for UTI. Started cipro yesterday; finish 3 day course.   Hyponatremia ?Dehydration     - getting 1L IVF; follow up BMP in AM     - urine osm/Na+ ordered  Falls     - related to above     - PT eval  DVT prophylaxis: SCDs  Code Status: DNR  Family Communication: With neighbor at bedside  Consults called: EDP called GI  Admission status: Inpatient d/t concern for hemodynamic stability and ongoing bleed.  Status is:  Inpatient  Remains inpatient appropriate because:Inpatient level of care appropriate due to severity of illness   Dispo: The patient is from: Home              Anticipated d/c is to: Home              Anticipated d/c date is: 2 days              Patient currently is not medically stable to d/c.  Teddy Spike DO [facility]  If 7PM-7AM, please contact night-coverage [email or web address]  08/22/2020, 2:32 PM

## 2020-08-22 NOTE — ED Triage Notes (Signed)
Pt. Arrived by EMS from home, weakness x2 days. Pt. Just returned from a rehab facility from a fall 3 weeks ago. Family/ friends report helping patient to the bathroom and she having a couple of syncopal episodes. Decrease appetite and intake.  Hx: recurrent UTI ( started antibiotics yesterday)     Bp: 110/56- 90- 18- 95% RA  CBG: 162

## 2020-08-22 NOTE — ED Notes (Signed)
ED TO INPATIENT HANDOFF REPORT  Name/Age/Gender Elizabeth Haynes 74 y.o. female  Code Status   Home/SNF/Other Home  Chief Complaint GIB (gastrointestinal bleeding) [K92.2]  Level of Care/Admitting Diagnosis ED Disposition    ED Disposition Condition Comment   Admit  Hospital Area: [facility] [[id number]]  Level of Care: Telemetry [5]  Admit to tele based on following criteria: Eval of Syncope  May admit patient to [facility] or [facility] if equivalent level of care is available:: No  Covid Evaluation: Asymptomatic Screening Protocol (No Symptoms)  Diagnosis: GIB (gastrointestinal bleeding) [762831]  Admitting Physician: Teddy Spike [[id number]]  Attending Physician: Teddy Spike [[id number]]  Estimated length of stay: past midnight tomorrow  Certification:: I certify this patient will need inpatient services for at least 2 midnights       Medical History Past Medical History:  Diagnosis Date  . Osteopenia   . Osteoporosis 09/2016  . Rosacea   . Symptomatic menopausal or female climacteric states     Allergies Allergies  Allergen Reactions  . Tramadol Other (See Comments)    confusion  . Bactrim [Sulfamethoxazole-Trimethoprim] Hives    IV Location/Drains/Wounds Patient Lines/Drains/Airways Status    Active Line/Drains/Airways    Name Placement date Placement time Site Days   Peripheral IV 08/22/20 Left Antecubital 08/22/20  1222  Antecubital  less than 1          Labs/Imaging Results for orders placed or performed during the hospital encounter of 08/22/20 (from the past 48 hour(s))  Basic metabolic panel     Status: Abnormal   Collection Time: 08/22/20 10:30 AM  Result Value Ref Range   Sodium 128 (L) 135 - 145 mmol/L   Potassium 4.4 3.5 - 5.1 mmol/L   Chloride 92 (L) 98 - 111 mmol/L   CO2 27 22 - 32 mmol/L   Glucose, Bld 120 (H) 70 - 99 mg/dL    Comment: Glucose reference range applies only to samples taken after fasting for at least  8 hours.   BUN 48 (H) 8 - 23 mg/dL   Creatinine, Ser 6.94 0.44 - 1.00 mg/dL   Calcium 8.8 (L) 8.9 - 10.3 mg/dL   GFR calc non Af Amer >60 >60 mL/min   GFR calc Af Amer >60 >60 mL/min   Anion gap 9 5 - 15    Comment: Performed at [facility], [address], [place], [place] [address]  CBC with Differential     Status: Abnormal   Collection Time: 08/22/20 10:30 AM  Result Value Ref Range   WBC 9.7 4.0 - 10.5 K/uL   RBC 1.81 (L) 3.87 - 5.11 MIL/uL   Hemoglobin 5.6 (LL) 12.0 - 15.0 g/dL    Comment: This critical result has verified and been called to GARRISON,G by Georgia Dom on 09 26 2021 at 1123, and has been read back. CRITICAL RESULT VERIFIED   HCT 16.6 (L) 36 - 46 %   MCV 91.7 80.0 - 100.0 fL   MCH 30.9 26.0 - 34.0 pg   MCHC 33.7 30.0 - 36.0 g/dL   RDW 70.3 50.0 - 93.8 %   Platelets 485 (H) 150 - 400 K/uL   nRBC 0.0 0.0 - 0.2 %   Neutrophils Relative % 79 %   Neutro Abs 7.6 1.7 - 7.7 K/uL   Lymphocytes Relative 14 %   Lymphs Abs 1.4 0.7 - 4.0 K/uL   Monocytes Relative 6 %   Monocytes Absolute 0.6 0 -  1 K/uL   Eosinophils Relative 0 %   Eosinophils Absolute 0.0 0 - 0 K/uL   Basophils Relative 0 %   Basophils Absolute 0.0 0 - 0 K/uL   Immature Granulocytes 1 %   Abs Immature Granulocytes 0.09 (H) 0.00 - 0.07 K/uL    Comment: Performed at [facility], [address] [place], [place] [address]  Type and screen [facility]     Status: None (Preliminary result)   Collection Time: 08/22/20 11:25 AM  Result Value Ref Range   ABO/RH(D) A POS    Antibody Screen NEG    Sample Expiration 08/25/2020,2359    Unit Number [id number]    Blood Component Type RED CELLS,LR    Unit division 00    Status of Unit ALLOCATED    Transfusion Status OK TO TRANSFUSE    Crossmatch Result      Compatible Performed at [facility], [address] [place], [place] [address]    Unit Number [id number]    Blood  Component Type RED CELLS,LR    Unit division 00    Status of Unit ALLOCATED    Transfusion Status OK TO TRANSFUSE    Crossmatch Result Compatible   POC occult blood, ED     Status: Abnormal   Collection Time: 08/22/20 12:08 PM  Result Value Ref Range   Fecal Occult Bld POSITIVE (A) NEGATIVE  Prepare RBC (crossmatch)     Status: None   Collection Time: 08/22/20 12:30 PM  Result Value Ref Range   Order Confirmation      ORDER PROCESSED BY BLOOD BANK Performed at [facility], [address], [place], [place] [address]   ABO/Rh     Status: None   Collection Time: 08/22/20 12:43 PM  Result Value Ref Range   ABO/RH(D)      A POS Performed at [facility], [address] [place], [place] [address]    DG Chest 2 View  Result Date: 08/22/2020 CLINICAL DATA:  Syncope. EXAM: CHEST - 2 VIEW COMPARISON:  None. FINDINGS: Cardiomediastinal silhouette is normal. Mediastinal contours appear intact. There is no evidence of focal airspace consolidation, pleural effusion or pneumothorax. Osseous structures are without acute abnormality. Soft tissues are grossly normal. IMPRESSION: No active cardiopulmonary disease. Electronically Signed   By: Ted Mcalpine M.D.   On: 08/22/2020 11:52   CT Head Wo Contrast  Result Date: 08/22/2020 CLINICAL DATA:  Recent syncopal episode EXAM: CT HEAD WITHOUT CONTRAST TECHNIQUE: Contiguous axial images were obtained from the base of the skull through the vertex without intravenous contrast. COMPARISON:  None. FINDINGS: Brain: Mild atrophic changes and chronic white matter ischemic changes are noted. No findings to suggest acute hemorrhage, acute infarction or space-occupying mass lesion are noted. Vascular: No hyperdense vessel or unexpected calcification. Skull: Normal. Negative for fracture or focal lesion. Sinuses/Orbits: No acute finding. Other: None. IMPRESSION: Chronic atrophic and ischemic changes without acute  abnormality. Electronically Signed   By: Alcide Clever M.D.   On: 08/22/2020 12:05    Pending Labs Unresulted Labs (From admission, onward)          Start     Ordered   08/22/20 1202  Respiratory Panel by RT PCR (Flu A&B, Covid) - Nasopharyngeal Swab  (Tier 2 (TAT 2 hrs))  Once,   STAT       Question Answer Comment  Is this test for diagnosis or screening Screening   Symptomatic for COVID-19  as defined by CDC No   Hospitalized for COVID-19 No   Admitted to ICU for COVID-19 No   Previously tested for COVID-19 No   Resident in a congregate (group) care setting No   Employed in healthcare setting No   Pregnant No   Has patient completed COVID vaccination(s) (2 doses of Pfizer/Moderna 1 dose of [employer]) Unknown      08/22/20 1201   08/22/20 1104  Urinalysis, Routine w reflex microscopic  ONCE - STAT,   STAT        08/22/20 1104   08/22/20 1104  Urine culture  ONCE - STAT,   STAT        08/22/20 1104          Vitals/Pain Today's Vitals   08/22/20 1222 08/22/20 1253 08/22/20 1300 08/22/20 1330  BP: 93/62 (!) 96/51 (!) 100/54 (!) 100/41  Pulse: 92 86  88  Resp: 18 15 17 20   Temp:      TempSrc:      SpO2: 99% 100%  100%    Isolation Precautions No active isolations  Medications Medications  0.9 %  sodium chloride infusion (has no administration in time range)  sodium chloride 0.9 % bolus 1,000 mL (0 mLs Intravenous Stopped 08/22/20 1359)  pantoprazole (PROTONIX) injection 40 mg (40 mg Intravenous Given 08/22/20 1343)    Mobility walks with device

## 2020-08-22 NOTE — ED Provider Notes (Signed)
[facility]-EMERGENCY DEPT Provider Note   CSN: [id number] Arrival date & time: 08/22/20  1001     History Chief Complaint  Patient presents with  . Weakness    [name] is a 74 y.o. female with a past medical history of osteoporosis presenting to the ED with a chief complaint of generalized weakness, decreased appetite and syncope.  Was discharged to a rehab facility from a fall 3 weeks ago.  States that after discharge she was doing well up until a few days ago.  She reports syncopal episodes yesterday while trying to go to the bathroom.  She reports generalized weakness, fatigue and decreased appetite.  Was diagnosed with a UTI and started on antibiotics yesterday.  She denies any specific chest pain.  Had abdominal pain yesterday which improved after having "humongous bowel movement."  Denies any diarrhea or bloody stools.  No vomiting.  She has had intermittent headaches for the past several days which improved with Tylenol.  Has a "slight headache" today.  No vision changes, numbness in arms or legs, shortness of breath, anticoagulant use.  HPI     Past Medical History:  Diagnosis Date  . Osteopenia   . Osteoporosis 09/2016  . Rosacea   . Symptomatic menopausal or female climacteric states     Patient Active Problem List   Diagnosis Date Noted  . GIB (gastrointestinal bleeding) 08/22/2020  . Osteoporosis without current pathological fracture 07/07/2018  . Osteopenia 10/01/2013  . Rosacea 10/01/2013  . Allergic rhinitis, cause unspecified 10/01/2013    Past Surgical History:  Procedure Laterality Date  . TONSILLECTOMY AND ADENOIDECTOMY  age 89  . TUBAL LIGATION       OB History    Gravida  0   Para  0   Term  0   Preterm  0   AB  0   Living  0     SAB  0   TAB  0   Ectopic  0   Multiple  0   Live Births              Family History  Problem Relation Age of Onset  . Dementia Mother   . Hip fracture Mother        x  2  . Alzheimer's disease Mother   . Hypertension Mother   . Heart disease Mother        pacemaker  . Cancer Father        thyroid,lung,prostate  . Diabetes Other   . Breast cancer Maternal Grandmother   . Heart attack Maternal Grandfather     Social History   Tobacco Use  . Smoking status: Never Smoker  . Smokeless tobacco: Never Used  Vaping Use  . Vaping Use: Never used  Substance Use Topics  . Alcohol use: No  . Drug use: No    Home Medications Prior to Admission medications   Medication Sig Start Date End Date Taking? Authorizing Provider  Azelaic Acid (FINACEA EX) Apply topically.   Yes [provider]  b complex vitamins capsule Take 1 capsule by mouth daily.   Yes [provider]  calcium carbonate 1250 MG capsule Take 1,250 mg by mouth 2 (two) times daily with a meal.   Yes [provider]  cholecalciferol (VITAMIN D) 1000 UNITS tablet Take 1,000 Units by mouth daily.   Yes [provider]  ciprofloxacin (CIPRO) 500 MG tablet Take 1 tablet (500 mg total) by mouth 2 (two)  times daily. 08/21/20  Yes Ronnald Nian, MD  HYDROcodone-acetaminophen (NORCO/VICODIN) 5-325 MG tablet Take 1 tablet by mouth 4 (four) times daily as needed for pain. 07/30/20  Yes [provider]  Multiple Vitamins-Minerals (MULTIVITAMIN WITH MINERALS) tablet Take 1 tablet by mouth daily.   Yes [provider]  olopatadine (PATADAY) 0.1 % ophthalmic solution Place 1 drop into both eyes daily as needed for allergies.   Yes [provider]  vitamin E 1000 UNIT capsule Take 1,000 Units by mouth daily.   Yes [provider]    Allergies    Tramadol and Bactrim [sulfamethoxazole-trimethoprim]  Review of Systems   Review of Systems  Constitutional: Positive for activity change, appetite change and fatigue. Negative for chills and fever.  HENT: Negative for ear pain, rhinorrhea, sneezing and sore throat.   Eyes: Negative for  photophobia and visual disturbance.  Respiratory: Negative for cough, chest tightness, shortness of breath and wheezing.   Cardiovascular: Negative for chest pain and palpitations.  Gastrointestinal: Negative for abdominal pain, blood in stool, constipation, diarrhea, nausea and vomiting.  Genitourinary: Negative for dysuria, hematuria and urgency.  Musculoskeletal: Negative for myalgias.  Skin: Negative for rash.  Neurological: Positive for light-headedness and headaches. Negative for dizziness and weakness.    Physical Exam Updated Vital Signs BP (!) 96/51 (BP Location: Left Arm)   Pulse 86   Temp 99.5 F (37.5 C) (Oral)   Resp 15   SpO2 100%   Physical Exam Vitals and nursing note reviewed. Exam conducted with a chaperone present.  Constitutional:      General: She is not in acute distress.    Appearance: She is well-developed.  HENT:     Head: Normocephalic and atraumatic.     Nose: Nose normal.  Eyes:     General: No scleral icterus.       Right eye: No discharge.        Left eye: No discharge.     Conjunctiva/sclera: Conjunctivae normal.     Pupils: Pupils are equal, round, and reactive to light.  Cardiovascular:     Rate and Rhythm: Normal rate and regular rhythm.     Heart sounds: Normal heart sounds. No murmur heard.  No friction rub. No gallop.   Pulmonary:     Effort: Pulmonary effort is normal. No respiratory distress.     Breath sounds: Normal breath sounds.  Abdominal:     General: Bowel sounds are normal. There is no distension.     Palpations: Abdomen is soft.     Tenderness: There is no abdominal tenderness. There is no guarding.  Genitourinary:    Rectum: Guaiac result positive.     Comments: Rectal exam with maroon stool. Musculoskeletal:        General: Normal range of motion.     Cervical back: Normal range of motion and neck supple.  Skin:    General: Skin is warm and dry.     Findings: No rash.  Neurological:     General: No focal deficit  present.     Mental Status: She is alert and oriented to person, place, and time.     Cranial Nerves: No cranial nerve deficit.     Sensory: No sensory deficit.     Motor: No weakness or abnormal muscle tone.     Coordination: Coordination normal.     ED Results / Procedures / Treatments   Labs (all labs ordered are listed, but only abnormal results are displayed) Labs  Reviewed  BASIC METABOLIC PANEL - Abnormal; Notable for the following components:      Result Value   Sodium 128 (*)    Chloride 92 (*)    Glucose, Bld 120 (*)    BUN 48 (*)    Calcium 8.8 (*)    All other components within normal limits  CBC WITH DIFFERENTIAL/PLATELET - Abnormal; Notable for the following components:   RBC 1.81 (*)    Hemoglobin 5.6 (*)    HCT 16.6 (*)    Platelets 485 (*)    Abs Immature Granulocytes 0.09 (*)    All other components within normal limits  POC OCCULT BLOOD, ED - Abnormal; Notable for the following components:   Fecal Occult Bld POSITIVE (*)    All other components within normal limits  URINE CULTURE  RESPIRATORY PANEL BY RT PCR (FLU A&B, COVID)  URINALYSIS, ROUTINE W REFLEX MICROSCOPIC  TYPE AND SCREEN  PREPARE RBC (CROSSMATCH)  ABO/RH    EKG EKG Interpretation  Date/Time:  Sunday August 22 2020 10:50:51 EDT Ventricular Rate:  101 PR Interval:    QRS Duration: 73 QT Interval:  349 QTC Calculation: 453 R Axis:   82 Text Interpretation: Sinus tachycardia Borderline right axis deviation Consider left ventricular hypertrophy No prior ECG for comparison. No STEMI Confirmed by Theda Belfast ([id number]) on 08/22/2020 12:38:49 PM   Radiology DG Chest 2 View  Result Date: 08/22/2020 CLINICAL DATA:  Syncope. EXAM: CHEST - 2 VIEW COMPARISON:  None. FINDINGS: Cardiomediastinal silhouette is normal. Mediastinal contours appear intact. There is no evidence of focal airspace consolidation, pleural effusion or pneumothorax. Osseous structures are without acute abnormality. Soft  tissues are grossly normal. IMPRESSION: No active cardiopulmonary disease. Electronically Signed   By: Ted Mcalpine M.D.   On: 08/22/2020 11:52   CT Head Wo Contrast  Result Date: 08/22/2020 CLINICAL DATA:  Recent syncopal episode EXAM: CT HEAD WITHOUT CONTRAST TECHNIQUE: Contiguous axial images were obtained from the base of the skull through the vertex without intravenous contrast. COMPARISON:  None. FINDINGS: Brain: Mild atrophic changes and chronic white matter ischemic changes are noted. No findings to suggest acute hemorrhage, acute infarction or space-occupying mass lesion are noted. Vascular: No hyperdense vessel or unexpected calcification. Skull: Normal. Negative for fracture or focal lesion. Sinuses/Orbits: No acute finding. Other: None. IMPRESSION: Chronic atrophic and ischemic changes without acute abnormality. Electronically Signed   By: Alcide Clever M.D.   On: 08/22/2020 12:05    Procedures .Critical Care Performed by: Dietrich Pates, PA-C Authorized by: Dietrich Pates, PA-C   Critical care provider statement:    Critical care time (minutes):  45   Critical care was necessary to treat or prevent imminent or life-threatening deterioration of the following conditions:  Circulatory failure, sepsis and shock   Critical care was time spent personally by me on the following activities:  Development of treatment plan with patient or surrogate, discussions with consultants, evaluation of patient's response to treatment, examination of patient, obtaining history from patient or surrogate, ordering and performing treatments and interventions, ordering and review of laboratory studies, pulse oximetry, ordering and review of radiographic studies, re-evaluation of patient's condition and review of old charts   I assumed direction of critical care for this patient from another provider in my specialty: no     (including critical care time)  Medications Ordered in ED Medications  0.9 %   sodium chloride infusion (has no administration in time range)  pantoprazole (PROTONIX) injection 40 mg (has no administration  in time range)  sodium chloride 0.9 % bolus 1,000 mL (1,000 mLs Intravenous New Bag/Given (Non-Interop) 08/22/20 1222)    ED Course  I have reviewed the triage vital signs and the nursing notes.  Pertinent labs & imaging results that were available during my care of the patient were reviewed by me and considered in my medical decision making (see chart for details).  Clinical Course as of Aug 23 1331  Wynelle Link Aug 22, 2020  1208 Chart review shows that patient's hemoglobin was 10.7 at her prior discharge on 08/03/2020.   [HK]  1208 Hemoglobin(!!): 5.6 [HK]  1231 Fecal Occult Blood, POC(!): POSITIVE [HK]  1244 Spoke to on-call [facility].  Will see the patient in consult and will admit to medicine service.   [HK]    Clinical Course User Index [HK] Dietrich Pates, PA-C   MDM Rules/Calculators/A&P                          74 year old female with a recent admission after fall and sacral fracture discharged to rehab facility, discharged from rehab facility 2 days ago presenting to the ED for generalized weakness and syncope.  Neighbor at bedside providing history as well.  She reports she has been feeling generally weak with decreased appetite for the past 2 days since being discharged from rehab facility.  Has noticed some dark and tarry stools.  Patient denies any abdominal pain or chest pain.  She had a bowel movement prior to arrival.  She is afebrile here without recent use of antipyretics.  Work-up here significant for hemoglobin of 5.6 which is about half of what it was at her hospital discharge 2 weeks ago.  Stool is Hemoccult positive.  She is hyponatremic of 128 which is similar to priors.  EKG shows sinus tachycardia, no STEMI.  CT of the head and chest x-ray without any acute findings.  I have ordered Protonix and 2 units of blood for transfusion.  Will consult GI who  will see the patient in consult.  Patient to be admitted for ongoing management of her symptomatic anemia and acute GI bleed.  Of note, she denies history of colonoscopy or endoscopy in the past, last did a Cologuard test 2 years ago, which was negative for she remembers.  No anticoagulant use. Appreciate the help of GI and hospitalist for management of this patient.  All imaging, if done today, including plain films, CT scans, and ultrasounds, independently reviewed by me, and interpretations confirmed via formal radiology reads.  Portions of this note were generated with Scientist, clinical (histocompatibility and immunogenetics). Dictation errors may occur despite best attempts at proofreading.  Final Clinical Impression(s) / ED Diagnoses Final diagnoses:  Symptomatic anemia  Acute GI bleeding    Rx / DC Orders ED Discharge Orders    None       Dietrich Pates, PA-C 08/22/20 1332    Tegeler, Canary Brim, MD 08/23/20 [phone]

## 2020-08-22 NOTE — ED Notes (Signed)
Pt transported to Xray. 

## 2020-08-22 NOTE — Consult Note (Signed)
[facility] Consultation Note  Referring Provider: [facility] Primary Care Physician:  Joselyn Arrow, MD  Reason for Consultation:  Anemia, GI bleeding  HPI: Elizabeth Haynes is a 74 y.o. female with recent rehab stay after fall with back injury.  She presents to ED after progressive weakness over past few days followed by several episodes of syncope over the weekend.  Patient's neighbor/advocate reports large, dark stool yesterday; dark stool seen on rectal exam per ED.  Patient has some mild lower abdominal soreness, feels a bit bloated.  No hematemesis.  No prior GI bleeding.  Denies NSAIDs or blood thinners.  No prior endoscopy or colonoscopy.   Past Medical History:  Diagnosis Date  . Osteopenia   . Osteoporosis 09/2016  . Rosacea   . Symptomatic menopausal or female climacteric states     Past Surgical History:  Procedure Laterality Date  . TONSILLECTOMY AND ADENOIDECTOMY  age 57  . TUBAL LIGATION      Prior to Admission medications   Medication Sig Start Date End Date Taking? Authorizing Provider  Azelaic Acid (FINACEA EX) Apply topically.   Yes [provider]  b complex vitamins capsule Take 1 capsule by mouth daily.   Yes [provider]  calcium carbonate 1250 MG capsule Take 1,250 mg by mouth 2 (two) times daily with a meal.   Yes [provider]  cholecalciferol (VITAMIN D) 1000 UNITS tablet Take 1,000 Units by mouth daily.   Yes [provider]  ciprofloxacin (CIPRO) 500 MG tablet Take 1 tablet (500 mg total) by mouth 2 (two) times daily. 08/21/20  Yes Ronnald Nian, MD  HYDROcodone-acetaminophen (NORCO/VICODIN) 5-325 MG tablet Take 1 tablet by mouth 4 (four) times daily as needed for pain. 07/30/20  Yes [provider]  Multiple Vitamins-Minerals (MULTIVITAMIN WITH MINERALS) tablet Take 1 tablet by mouth daily.   Yes [provider]  olopatadine (PATADAY) 0.1 % ophthalmic solution Place 1 drop into both  eyes daily as needed for allergies.   Yes [provider]  vitamin E 1000 UNIT capsule Take 1,000 Units by mouth daily.   Yes [provider]    Current Facility-Administered Medications  Medication Dose Route Frequency Provider Last Rate Last Admin  . 0.9 %  sodium chloride infusion  10 mL/hr Intravenous Once Khatri, Hina, PA-C       Current Outpatient Medications  Medication Sig Dispense Refill  . Azelaic Acid (FINACEA EX) Apply topically.    Marland Kitchen b complex vitamins capsule Take 1 capsule by mouth daily.    . calcium carbonate 1250 MG capsule Take 1,250 mg by mouth 2 (two) times daily with a meal.    . cholecalciferol (VITAMIN D) 1000 UNITS tablet Take 1,000 Units by mouth daily.    . ciprofloxacin (CIPRO) 500 MG tablet Take 1 tablet (500 mg total) by mouth 2 (two) times daily. 20 tablet 0  . HYDROcodone-acetaminophen (NORCO/VICODIN) 5-325 MG tablet Take 1 tablet by mouth 4 (four) times daily as needed for pain.    . Multiple Vitamins-Minerals (MULTIVITAMIN WITH MINERALS) tablet Take 1 tablet by mouth daily.    Marland Kitchen olopatadine (PATADAY) 0.1 % ophthalmic solution Place 1 drop into both eyes daily as needed for allergies.    . vitamin E 1000 UNIT capsule Take 1,000 Units by mouth daily.      Allergies as of 08/22/2020 - Review Complete 08/22/2020  Allergen Reaction Noted  . Tramadol Other (See Comments) 07/30/2020  . Bactrim [sulfamethoxazole-trimethoprim] Hives 04/28/2013  Family History  Problem Relation Age of Onset  . Dementia Mother   . Hip fracture Mother        x 2  . Alzheimer's disease Mother   . Hypertension Mother   . Heart disease Mother        pacemaker  . Cancer Father        thyroid,lung,prostate  . Diabetes Other   . Breast cancer Maternal Grandmother   . Heart attack Maternal Grandfather     Social History   Socioeconomic History  . Marital status: Single    Spouse name: Not on file  . Number of children: Not on file  . Years of  education: Not on file  . Highest education level: Not on file  Occupational History  . Not on file  Tobacco Use  . Smoking status: Never Smoker  . Smokeless tobacco: Never Used  Vaping Use  . Vaping Use: Never used  Substance and Sexual Activity  . Alcohol use: No  . Drug use: No  . Sexual activity: Not Currently  Other Topics Concern  . Not on file  Social History Narrative   Lives with dog.  2 stepchildren ([place] and [place]).  Widowed. 8 grandchildren, 13 great grandchildren   Social Determinants of Corporate investment banker Strain:   . Difficulty of Paying Living Expenses: Not on file  Food Insecurity:   . Worried About Programme researcher, broadcasting/film/video in the Last Year: Not on file  . Ran Out of Food in the Last Year: Not on file  Transportation Needs:   . Lack of Transportation (Medical): Not on file  . Lack of Transportation (Non-Medical): Not on file  Physical Activity:   . Days of Exercise per Week: Not on file  . Minutes of Exercise per Session: Not on file  Stress:   . Feeling of Stress : Not on file  Social Connections:   . Frequency of Communication with Friends and Family: Not on file  . Frequency of Social Gatherings with Friends and Family: Not on file  . Attends Religious Services: Not on file  . Active Member of Clubs or Organizations: Not on file  . Attends Banker Meetings: Not on file  . Marital Status: Not on file  Intimate Partner Violence:   . Fear of Current or Ex-Partner: Not on file  . Emotionally Abused: Not on file  . Physically Abused: Not on file  . Sexually Abused: Not on file    Review of Systems: As per HPI, all others negative  Physical Exam: Vital signs in last 24 hours: Temp:  [99 F (37.2 C)-99.5 F (37.5 C)] 99.5 F (37.5 C) (09/26 1206) Pulse Rate:  [86-102] 88 (09/26 1330) Resp:  [15-20] 20 (09/26 1330) BP: (93-100)/(41-62) 100/41 (09/26 1330) SpO2:  [95 %-100 %] 100 % (09/26 1330)   General:   Alert,  Thin,  somewhat cachectic-appearing, scoliosis? Head:  Normocephalic and atraumatic. Eyes:  Sclera clear, no icterus.   Conjunctiva pale Ears:  Normal auditory acuity. Nose:  No deformity, discharge,  or lesions. Mouth:  No deformity or lesions.  Oropharynx pale and dry Neck:  Supple; no masses or thyromegaly. Abdomen:  Soft, nontender and nondistended. No masses, hepatosplenomegaly or hernias noted. Normal bowel sounds, without guarding, and without rebound.     Msk:  Symmetrical without gross deformities. Normal posture. Pulses:  Normal pulses noted. Extremities:  Without clubbing or edema. Neurologic:  Alert and  oriented x4; diffusely weak, otherwise  grossly normal neurologically. Skin:  Intact without significant lesions or rashes. Psych:  Alert and cooperative. Normal mood and affect.   Lab Results: Recent Labs    08/22/20 1030  WBC 9.7  HGB 5.6*  HCT 16.6*  PLT 485*   BMET Recent Labs    08/22/20 1030  NA 128*  K 4.4  CL 92*  CO2 27  GLUCOSE 120*  BUN 48*  CREATININE 0.62  CALCIUM 8.8*   LFT No results for input(s): PROT, ALBUMIN, AST, ALT, ALKPHOS, BILITOT, BILIDIR, IBILI in the last 72 hours. PT/INR No results for input(s): LABPROT, INR in the last 72 hours.  Studies/Results: DG Chest 2 View  Result Date: 08/22/2020 CLINICAL DATA:  Syncope. EXAM: CHEST - 2 VIEW COMPARISON:  None. FINDINGS: Cardiomediastinal silhouette is normal. Mediastinal contours appear intact. There is no evidence of focal airspace consolidation, pleural effusion or pneumothorax. Osseous structures are without acute abnormality. Soft tissues are grossly normal. IMPRESSION: No active cardiopulmonary disease. Electronically Signed   By: Ted Mcalpine M.D.   On: 08/22/2020 11:52   CT Head Wo Contrast  Result Date: 08/22/2020 CLINICAL DATA:  Recent syncopal episode EXAM: CT HEAD WITHOUT CONTRAST TECHNIQUE: Contiguous axial images were obtained from the base of the skull through the vertex  without intravenous contrast. COMPARISON:  None. FINDINGS: Brain: Mild atrophic changes and chronic white matter ischemic changes are noted. No findings to suggest acute hemorrhage, acute infarction or space-occupying mass lesion are noted. Vascular: No hyperdense vessel or unexpected calcification. Skull: Normal. Negative for fracture or focal lesion. Sinuses/Orbits: No acute finding. Other: None. IMPRESSION: Chronic atrophic and ischemic changes without acute abnormality. Electronically Signed   By: Alcide Clever M.D.   On: 08/22/2020 12:05    Impression:  1.  Syncope, likely anemia/GIB-related. 2.  Dark stools.  Suspect of UGI bleeding. 3.  Anemia, acute blood loss. 4.  Recent back injury with rehab stay.  Plan:  1.  PPI. 2.  Blood transfusion. 3.  Clear liquid diet, NPO after midnight. 4.  COVID testing. 5.  Tentative plan for endoscopy tomorrow. 6.  Risks (bleeding, infection, bowel perforation that could require surgery, sedation-related changes in cardiopulmonary systems), benefits (identification and possible treatment of source of symptoms, exclusion of certain causes of symptoms), and alternatives (watchful waiting, radiographic imaging studies, empiric medical treatment) of upper endoscopy (EGD) were explained to patient/family in detail and patient wishes to proceed. 7.  [facility] will follow.   LOS: 0 days   Tammra Pressman M  08/22/2020, 1:51 PM  Cell [phone] If no answer or after 5 PM call [phone]

## 2020-08-22 NOTE — H&P (View-Only) (Signed)
[facility] Consultation Note  Referring Provider: [facility] Primary Care Physician:  Knapp, Eve, MD  Reason for Consultation:  Anemia, GI bleeding  HPI: Elizabeth Haynes is a 74 y.o. female with recent rehab stay after fall with back injury.  She presents to ED after progressive weakness over past few days followed by several episodes of syncope over the weekend.  Patient's neighbor/advocate reports large, dark stool yesterday; dark stool seen on rectal exam per ED.  Patient has some mild lower abdominal soreness, feels a bit bloated.  No hematemesis.  No prior GI bleeding.  Denies NSAIDs or blood thinners.  No prior endoscopy or colonoscopy.   Past Medical History:  Diagnosis Date  . Osteopenia   . Osteoporosis 09/2016  . Rosacea   . Symptomatic menopausal or female climacteric states     Past Surgical History:  Procedure Laterality Date  . TONSILLECTOMY AND ADENOIDECTOMY  age 6  . TUBAL LIGATION      Prior to Admission medications   Medication Sig Start Date End Date Taking? Authorizing Provider  Azelaic Acid (FINACEA EX) Apply topically.   Yes [provider]  b complex vitamins capsule Take 1 capsule by mouth daily.   Yes [provider]  calcium carbonate 1250 MG capsule Take 1,250 mg by mouth 2 (two) times daily with a meal.   Yes [provider]  cholecalciferol (VITAMIN D) 1000 UNITS tablet Take 1,000 Units by mouth daily.   Yes [provider]  ciprofloxacin (CIPRO) 500 MG tablet Take 1 tablet (500 mg total) by mouth 2 (two) times daily. 08/21/20  Yes Lalonde, John C, MD  HYDROcodone-acetaminophen (NORCO/VICODIN) 5-325 MG tablet Take 1 tablet by mouth 4 (four) times daily as needed for pain. 07/30/20  Yes [provider]  Multiple Vitamins-Minerals (MULTIVITAMIN WITH MINERALS) tablet Take 1 tablet by mouth daily.   Yes [provider]  olopatadine (PATADAY) 0.1 % ophthalmic solution Place 1 drop into both  eyes daily as needed for allergies.   Yes [provider]  vitamin E 1000 UNIT capsule Take 1,000 Units by mouth daily.   Yes [provider]    Current Facility-Administered Medications  Medication Dose Route Frequency Provider Last Rate Last Admin  . 0.9 %  sodium chloride infusion  10 mL/hr Intravenous Once Khatri, Hina, PA-C       Current Outpatient Medications  Medication Sig Dispense Refill  . Azelaic Acid (FINACEA EX) Apply topically.    . b complex vitamins capsule Take 1 capsule by mouth daily.    . calcium carbonate 1250 MG capsule Take 1,250 mg by mouth 2 (two) times daily with a meal.    . cholecalciferol (VITAMIN D) 1000 UNITS tablet Take 1,000 Units by mouth daily.    . ciprofloxacin (CIPRO) 500 MG tablet Take 1 tablet (500 mg total) by mouth 2 (two) times daily. 20 tablet 0  . HYDROcodone-acetaminophen (NORCO/VICODIN) 5-325 MG tablet Take 1 tablet by mouth 4 (four) times daily as needed for pain.    . Multiple Vitamins-Minerals (MULTIVITAMIN WITH MINERALS) tablet Take 1 tablet by mouth daily.    . olopatadine (PATADAY) 0.1 % ophthalmic solution Place 1 drop into both eyes daily as needed for allergies.    . vitamin E 1000 UNIT capsule Take 1,000 Units by mouth daily.      Allergies as of 08/22/2020 - Review Complete 08/22/2020  Allergen Reaction Noted  . Tramadol Other (See Comments) 07/30/2020  . Bactrim [sulfamethoxazole-trimethoprim] Hives 04/28/2013      Family History  Problem Relation Age of Onset  . Dementia Mother   . Hip fracture Mother        x 2  . Alzheimer's disease Mother   . Hypertension Mother   . Heart disease Mother        pacemaker  . Cancer Father        thyroid,lung,prostate  . Diabetes Other   . Breast cancer Maternal Grandmother   . Heart attack Maternal Grandfather     Social History   Socioeconomic History  . Marital status: Single    Spouse name: Not on file  . Number of children: Not on file  . Years of  education: Not on file  . Highest education level: Not on file  Occupational History  . Not on file  Tobacco Use  . Smoking status: Never Smoker  . Smokeless tobacco: Never Used  Vaping Use  . Vaping Use: Never used  Substance and Sexual Activity  . Alcohol use: No  . Drug use: No  . Sexual activity: Not Currently  Other Topics Concern  . Not on file  Social History Narrative   Lives with dog.  2 stepchildren ([place] and [place]).  Widowed. 8 grandchildren, 13 great grandchildren   Social Determinants of Health   Financial Resource Strain:   . Difficulty of Paying Living Expenses: Not on file  Food Insecurity:   . Worried About Running Out of Food in the Last Year: Not on file  . Ran Out of Food in the Last Year: Not on file  Transportation Needs:   . Lack of Transportation (Medical): Not on file  . Lack of Transportation (Non-Medical): Not on file  Physical Activity:   . Days of Exercise per Week: Not on file  . Minutes of Exercise per Session: Not on file  Stress:   . Feeling of Stress : Not on file  Social Connections:   . Frequency of Communication with Friends and Family: Not on file  . Frequency of Social Gatherings with Friends and Family: Not on file  . Attends Religious Services: Not on file  . Active Member of Clubs or Organizations: Not on file  . Attends Club or Organization Meetings: Not on file  . Marital Status: Not on file  Intimate Partner Violence:   . Fear of Current or Ex-Partner: Not on file  . Emotionally Abused: Not on file  . Physically Abused: Not on file  . Sexually Abused: Not on file    Review of Systems: As per HPI, all others negative  Physical Exam: Vital signs in last 24 hours: Temp:  [99 F (37.2 C)-99.5 F (37.5 C)] 99.5 F (37.5 C) (09/26 1206) Pulse Rate:  [86-102] 88 (09/26 1330) Resp:  [15-20] 20 (09/26 1330) BP: (93-100)/(41-62) 100/41 (09/26 1330) SpO2:  [95 %-100 %] 100 % (09/26 1330)   General:   Alert,  Thin,  somewhat cachectic-appearing, scoliosis? Head:  Normocephalic and atraumatic. Eyes:  Sclera clear, no icterus.   Conjunctiva pale Ears:  Normal auditory acuity. Nose:  No deformity, discharge,  or lesions. Mouth:  No deformity or lesions.  Oropharynx pale and dry Neck:  Supple; no masses or thyromegaly. Abdomen:  Soft, nontender and nondistended. No masses, hepatosplenomegaly or hernias noted. Normal bowel sounds, without guarding, and without rebound.     Msk:  Symmetrical without gross deformities. Normal posture. Pulses:  Normal pulses noted. Extremities:  Without clubbing or edema. Neurologic:  Alert and  oriented x4; diffusely weak, otherwise   grossly normal neurologically. Skin:  Intact without significant lesions or rashes. Psych:  Alert and cooperative. Normal mood and affect.   Lab Results: Recent Labs    08/22/20 1030  WBC 9.7  HGB 5.6*  HCT 16.6*  PLT 485*   BMET Recent Labs    08/22/20 1030  NA 128*  K 4.4  CL 92*  CO2 27  GLUCOSE 120*  BUN 48*  CREATININE 0.62  CALCIUM 8.8*   LFT No results for input(s): PROT, ALBUMIN, AST, ALT, ALKPHOS, BILITOT, BILIDIR, IBILI in the last 72 hours. PT/INR No results for input(s): LABPROT, INR in the last 72 hours.  Studies/Results: DG Chest 2 View  Result Date: 08/22/2020 CLINICAL DATA:  Syncope. EXAM: CHEST - 2 VIEW COMPARISON:  None. FINDINGS: Cardiomediastinal silhouette is normal. Mediastinal contours appear intact. There is no evidence of focal airspace consolidation, pleural effusion or pneumothorax. Osseous structures are without acute abnormality. Soft tissues are grossly normal. IMPRESSION: No active cardiopulmonary disease. Electronically Signed   By: Dobrinka  Dimitrova M.D.   On: 08/22/2020 11:52   CT Head Wo Contrast  Result Date: 08/22/2020 CLINICAL DATA:  Recent syncopal episode EXAM: CT HEAD WITHOUT CONTRAST TECHNIQUE: Contiguous axial images were obtained from the base of the skull through the vertex  without intravenous contrast. COMPARISON:  None. FINDINGS: Brain: Mild atrophic changes and chronic white matter ischemic changes are noted. No findings to suggest acute hemorrhage, acute infarction or space-occupying mass lesion are noted. Vascular: No hyperdense vessel or unexpected calcification. Skull: Normal. Negative for fracture or focal lesion. Sinuses/Orbits: No acute finding. Other: None. IMPRESSION: Chronic atrophic and ischemic changes without acute abnormality. Electronically Signed   By: Mark  Lukens M.D.   On: 08/22/2020 12:05    Impression:  1.  Syncope, likely anemia/GIB-related. 2.  Dark stools.  Suspect of UGI bleeding. 3.  Anemia, acute blood loss. 4.  Recent back injury with rehab stay.  Plan:  1.  PPI. 2.  Blood transfusion. 3.  Clear liquid diet, NPO after midnight. 4.  COVID testing. 5.  Tentative plan for endoscopy tomorrow. 6.  Risks (bleeding, infection, bowel perforation that could require surgery, sedation-related changes in cardiopulmonary systems), benefits (identification and possible treatment of source of symptoms, exclusion of certain causes of symptoms), and alternatives (watchful waiting, radiographic imaging studies, empiric medical treatment) of upper endoscopy (EGD) were explained to patient/family in detail and patient wishes to proceed. 7.  [facility] will follow.   LOS: 0 days   Maziah Keeling M  08/22/2020, 1:51 PM  Cell [phone] If no answer or after 5 PM call [phone]  

## 2020-08-23 ENCOUNTER — Inpatient Hospital Stay (HOSPITAL_COMMUNITY): Payer: Medicare Other | Admitting: Certified Registered Nurse Anesthetist

## 2020-08-23 ENCOUNTER — Encounter (HOSPITAL_COMMUNITY): Payer: Self-pay | Admitting: Internal Medicine

## 2020-08-23 ENCOUNTER — Encounter (HOSPITAL_COMMUNITY)
Admission: EM | Disposition: A | Discharge status: Home Health | Payer: Self-pay | Source: Home / Self Care | Attending: Internal Medicine

## 2020-08-23 ENCOUNTER — Inpatient Hospital Stay (HOSPITAL_COMMUNITY): Payer: Medicare Other

## 2020-08-23 DIAGNOSIS — D62 Acute posthemorrhagic anemia: Secondary | ICD-10-CM

## 2020-08-23 DIAGNOSIS — R55 Syncope and collapse: Secondary | ICD-10-CM

## 2020-08-23 DIAGNOSIS — D649 Anemia, unspecified: Secondary | ICD-10-CM

## 2020-08-23 HISTORY — PX: ESOPHAGOGASTRODUODENOSCOPY (EGD) WITH PROPOFOL: SHX5813

## 2020-08-23 HISTORY — PX: BIOPSY: SHX5522

## 2020-08-23 HISTORY — PX: SCLEROTHERAPY: SHX6841

## 2020-08-23 LAB — CBC
HCT: 25.4 % — ABNORMAL LOW (ref 36.0–46.0)
HCT: 19.4 % — ABNORMAL LOW (ref 36.0–46.0)
Hemoglobin: 9.1 g/dL — ABNORMAL LOW (ref 12.0–15.0)
Hemoglobin: 6.6 g/dL — CL (ref 12.0–15.0)
MCH: 31.6 pg (ref 26.0–34.0)
MCH: 31.3 pg (ref 26.0–34.0)
MCHC: 35.8 g/dL (ref 30.0–36.0)
MCHC: 34 g/dL (ref 30.0–36.0)
MCV: 88.2 fL (ref 80.0–100.0)
MCV: 91.9 fL (ref 80.0–100.0)
Platelets: 325 10*3/uL (ref 150–400)
Platelets: 292 10*3/uL (ref 150–400)
RBC: 2.88 MIL/uL — ABNORMAL LOW (ref 3.87–5.11)
RBC: 2.11 MIL/uL — ABNORMAL LOW (ref 3.87–5.11)
RDW: 13.9 % (ref 11.5–15.5)
RDW: 14.7 % (ref 11.5–15.5)
WBC: 10.6 10*3/uL — ABNORMAL HIGH (ref 4.0–10.5)
WBC: 12.6 10*3/uL — ABNORMAL HIGH (ref 4.0–10.5)
nRBC: 0 % (ref 0.0–0.2)
nRBC: 0 % (ref 0.0–0.2)

## 2020-08-23 LAB — COMPREHENSIVE METABOLIC PANEL WITH GFR
ALT: 14 U/L (ref 0–44)
AST: 14 U/L — ABNORMAL LOW (ref 15–41)
Albumin: 2.5 g/dL — ABNORMAL LOW (ref 3.5–5.0)
Alkaline Phosphatase: 76 U/L (ref 38–126)
Anion gap: 7 (ref 5–15)
BUN: 27 mg/dL — ABNORMAL HIGH (ref 8–23)
CO2: 24 mmol/L (ref 22–32)
Calcium: 8 mg/dL — ABNORMAL LOW (ref 8.9–10.3)
Chloride: 100 mmol/L (ref 98–111)
Creatinine, Ser: 0.44 mg/dL (ref 0.44–1.00)
GFR calc Af Amer: >60 mL/min (ref >60)
GFR calc non Af Amer: >60 mL/min (ref >60)
Glucose, Bld: 106 mg/dL — ABNORMAL HIGH (ref 70–99)
Potassium: 3.7 mmol/L (ref 3.5–5.1)
Sodium: 131 mmol/L — ABNORMAL LOW (ref 135–145)
Total Bilirubin: 0.9 mg/dL (ref 0.3–1.2)
Total Protein: 5.1 g/dL — ABNORMAL LOW (ref 6.5–8.1)

## 2020-08-23 LAB — URINALYSIS, ROUTINE W REFLEX MICROSCOPIC
Bilirubin Urine: NEGATIVE
Glucose, UA: NEGATIVE mg/dL
Ketones, ur: 5 mg/dL — AB
Leukocytes,Ua: NEGATIVE
Nitrite: NEGATIVE
Protein, ur: NEGATIVE mg/dL
Specific Gravity, Urine: 1.023 (ref 1.005–1.030)
pH: 5 (ref 5.0–8.0)

## 2020-08-23 LAB — HEMOGLOBIN AND HEMATOCRIT, BLOOD
HCT: 23.9 % — ABNORMAL LOW (ref 36.0–46.0)
Hemoglobin: 8.4 g/dL — ABNORMAL LOW (ref 12.0–15.0)

## 2020-08-23 LAB — PREPARE RBC (CROSSMATCH)

## 2020-08-23 LAB — TSH: TSH: 0.628 u[IU]/mL (ref 0.350–4.500)

## 2020-08-23 LAB — FERRITIN: Ferritin: 60 ng/mL (ref 11–307)

## 2020-08-23 SURGERY — ESOPHAGOGASTRODUODENOSCOPY (EGD) WITH PROPOFOL
Anesthesia: Monitor Anesthesia Care | Laterality: Left

## 2020-08-23 MED ORDER — LIDOCAINE HCL (CARDIAC) PF 100 MG/5ML IV SOSY
PREFILLED_SYRINGE | INTRAVENOUS | Status: DC | PRN
  Administered 2020-08-23: 50 mg via INTRAVENOUS

## 2020-08-23 MED ORDER — SODIUM CHLORIDE 0.9 % IV SOLN: INTRAVENOUS | Status: AC

## 2020-08-23 MED ORDER — VITAMIN D 25 MCG (1000 UNIT) PO TABS
1000.0000 [IU] | ORAL_TABLET | Freq: Every day | ORAL | Status: DC
  Administered 2020-08-23 – 2020-08-27 (×4): 1000 [IU] via ORAL
  Filled 2020-08-23 (×4): qty 1

## 2020-08-23 MED ORDER — PROPOFOL 500 MG/50ML IV EMUL
INTRAVENOUS | Status: DC | PRN
  Administered 2020-08-23: 125 ug/kg/min via INTRAVENOUS

## 2020-08-23 MED ORDER — CALCIUM CARBONATE 1250 (500 CA) MG PO TABS
1250.0000 mg | ORAL_TABLET | Freq: Two times a day (BID) | ORAL | Status: DC
  Administered 2020-08-23 – 2020-08-27 (×7): 1250 mg via ORAL
  Filled 2020-08-23 (×7): qty 1

## 2020-08-23 MED ORDER — LACTATED RINGERS IV SOLN: INTRAVENOUS | Status: DC

## 2020-08-23 MED ORDER — SODIUM CHLORIDE 0.9 % IV SOLN
8.0000 mg/h | INTRAVENOUS | Status: AC
  Administered 2020-08-23 – 2020-08-25 (×5): 8 mg/h via INTRAVENOUS
  Filled 2020-08-23 (×11): qty 80

## 2020-08-23 MED ORDER — OLOPATADINE HCL 0.1 % OP SOLN
1.0000 [drp] | Freq: Two times a day (BID) | OPHTHALMIC | Status: DC | PRN
  Filled 2020-08-23: qty 5

## 2020-08-23 MED ORDER — PROPOFOL 10 MG/ML IV BOLUS
INTRAVENOUS | Status: AC
  Filled 2020-08-23: qty 20

## 2020-08-23 MED ORDER — ADULT MULTIVITAMIN W/MINERALS CH
1.0000 | ORAL_TABLET | Freq: Every day | ORAL | Status: DC
  Administered 2020-08-23 – 2020-08-27 (×4): 1 via ORAL
  Filled 2020-08-23 (×4): qty 1

## 2020-08-23 MED ORDER — SODIUM CHLORIDE 0.9% IV SOLUTION: Freq: Once | INTRAVENOUS | Status: DC

## 2020-08-23 MED ORDER — SODIUM CHLORIDE (PF) 0.9 % IJ SOLN
PREFILLED_SYRINGE | INTRAMUSCULAR | Status: DC | PRN
  Administered 2020-08-23: 4 mL

## 2020-08-23 MED ORDER — PROPOFOL 500 MG/50ML IV EMUL
INTRAVENOUS | Status: DC | PRN
  Administered 2020-08-23: 20 mg via INTRAVENOUS
  Administered 2020-08-23: 30 mg via INTRAVENOUS
  Administered 2020-08-23: 20 mg via INTRAVENOUS

## 2020-08-23 MED ORDER — PROPOFOL 500 MG/50ML IV EMUL
INTRAVENOUS | Status: AC
  Filled 2020-08-23: qty 50

## 2020-08-23 MED ORDER — PANTOPRAZOLE SODIUM 40 MG IV SOLR
40.0000 mg | Freq: Two times a day (BID) | INTRAVENOUS | Status: DC
  Administered 2020-08-27: 40 mg via INTRAVENOUS
  Filled 2020-08-23 (×2): qty 40

## 2020-08-23 MED ORDER — VITAMIN E 180 MG (400 UNIT) PO CAPS
800.0000 [IU] | ORAL_CAPSULE | Freq: Every day | ORAL | Status: DC
  Administered 2020-08-23 – 2020-08-27 (×4): 800 [IU] via ORAL
  Filled 2020-08-23 (×7): qty 2

## 2020-08-23 MED ORDER — B COMPLEX-C PO TABS
1.0000 | ORAL_TABLET | Freq: Every day | ORAL | Status: DC
  Administered 2020-08-23 – 2020-08-27 (×4): 1 via ORAL
  Filled 2020-08-23 (×6): qty 1

## 2020-08-23 SURGICAL SUPPLY — 15 items

## 2020-08-23 NOTE — Progress Notes (Signed)
CRITICAL VALUE ALERT  Critical Value:  Hgb 6.6  Date & Time Notied:  08/23/20 1753   Provider Notified: [place]   Orders Received/Actions taken: Ordering 2 units PRBC

## 2020-08-23 NOTE — Progress Notes (Signed)
Initial Nutrition Assessment  DOCUMENTATION CODES:   Underweight  INTERVENTION:   Once diet is advanced: [employer] 1.4 po BID, each supplement provides 455 kcal and 20 grams protein.  -Multivitamin with minerals daily  NUTRITION DIAGNOSIS:   Inadequate oral intake related to poor appetite as evidenced by per patient/family report.  GOAL:   Patient will meet greater than or equal to 90% of their needs  MONITOR:   Diet advancement, Labs, Weight trends, I & O's  REASON FOR ASSESSMENT:   Malnutrition Screening Tool    ASSESSMENT:   74 y.o. female with medical history significant of osteoporosis. Presents with fatigue. Recent discharge from rehab facility. In normal state of health until 3 days ago when she noted some increased fatigue and difficulty getting around. Admitted for symptomatic anemia due to suspected acute/subacute upper GI bleeding, acute blood loss anemia and syncope.   Pt currently having EGD. NPO for procedure. Pt has only been NPO or on clear liquids since admission. Follows a lactose free, gluten free diet at home. Has reported poor appetite and poor PO intake PTA.  Will order [employer] (vegan supplement) once diet is advanced for additional kcals and protein.   Per weight records, pt weighed 99 lbs on 9/1. Current weight: 95 lbs. No other weights recorded since 2019. Pt is underweight and suspect some degree of malnutrition, will attempt diagnosis at follow-up.  Medications: Lactated ringers Labs reviewed: Low Na  NUTRITION - FOCUSED PHYSICAL EXAM:  Currently in endo  Diet Order:   Diet Order            Diet NPO time specified  Diet effective midnight                 EDUCATION NEEDS:   Not appropriate for education at this time  Skin:  Skin Assessment: Reviewed RN Assessment  Last BM:  9/27 -type 6  Height:   Ht Readings from Last 1 Encounters:  08/23/20 5\' 2"  (1.575 m)    Weight:   Wt Readings from Last 1 Encounters:   08/23/20 43.1 kg   BMI:  Body mass index is 17.38 kg/m.  Estimated Nutritional Needs:   Kcal:  1300-1500  Protein:  65-75g  Fluid:  1.5L/day  08/25/20, MS, RD, LDN Inpatient Clinical Dietitian Contact information available via Amion

## 2020-08-23 NOTE — Brief Op Note (Signed)
08/22/2020 - 08/23/2020  2:03 PM  PATIENT:  Sena Hitch  74 y.o. female  PRE-OPERATIVE DIAGNOSIS:  melena, anemia  POST-OPERATIVE DIAGNOSIS:  EGD:  duodenal ulcer, biopsy obtained r/o h pylori  PROCEDURE:  Procedure(s): ESOPHAGOGASTRODUODENOSCOPY (EGD) WITH PROPOFOL (Left) BIOPSY  SURGEON:  Surgeon(s) and Role:    Ronnette Juniper, MD - Primary  PHYSICIAN ASSISTANT:   ASSISTANTS: Ina Homes   ANESTHESIA:   MAC  EBL:  Minimal  BLOOD ADMINISTERED:none  DRAINS: none   LOCAL MEDICATIONS USED:  NONE  SPECIMEN:  Biopsy / Limited Resection  DISPOSITION OF SPECIMEN:  PATHOLOGY  COUNTS:  YES  TOURNIQUET:  * No tourniquets in log *  DICTATION: .Dragon Dictation  PLAN OF CARE: Admit to inpatient   PATIENT DISPOSITION:  PACU - hemodynamically stable.   Delay start of Pharmacological VTE agent (>24hrs) due to surgical blood loss or risk of bleeding: not applicable

## 2020-08-23 NOTE — Anesthesia Preprocedure Evaluation (Addendum)
Anesthesia Evaluation  Patient identified by MRN, date of birth, ID band Patient awake    Reviewed: Allergy & Precautions, NPO status , Patient's Chart, lab work & pertinent test results  Airway Mallampati: II  TM Distance: >3 FB Neck ROM: Full    Dental no notable dental hx. (+) Dental Advisory Given, Teeth Intact   Pulmonary neg pulmonary ROS,    Pulmonary exam normal breath sounds clear to auscultation       Cardiovascular negative cardio ROS Normal cardiovascular exam Rhythm:Regular Rate:Normal     Neuro/Psych negative neurological ROS     GI/Hepatic negative GI ROS, Neg liver ROS,   Endo/Other  negative endocrine ROS  Renal/GU negative Renal ROS     Musculoskeletal negative musculoskeletal ROS (+)   Abdominal   Peds  Hematology negative hematology ROS (+)   Anesthesia Other Findings   Reproductive/Obstetrics                            Anesthesia Physical Anesthesia Plan  ASA: III  Anesthesia Plan: MAC   Post-op Pain Management:    Induction: Intravenous  PONV Risk Score and Plan: 2 and Propofol infusion, TIVA and Treatment may vary due to age or medical condition  Airway Management Planned:   Additional Equipment: None  Intra-op Plan:   Post-operative Plan:   Informed Consent: I have reviewed the patients History and Physical, chart, labs and discussed the procedure including the risks, benefits and alternatives for the proposed anesthesia with the patient or authorized representative who has indicated his/her understanding and acceptance.   Patient has DNR.  Discussed DNR with patient and Suspend DNR.   Dental advisory given  Plan Discussed with: CRNA  Anesthesia Plan Comments:        Anesthesia Quick Evaluation

## 2020-08-23 NOTE — Transfer of Care (Signed)
Immediate Anesthesia Transfer of Care Note  Patient: Elizabeth Haynes  Procedure(s) Performed: ESOPHAGOGASTRODUODENOSCOPY (EGD) WITH PROPOFOL (Left ) BIOPSY SCLEROTHERAPY  Patient Location: Endoscopy Unit  Anesthesia Type:MAC  Level of Consciousness: drowsy and patient cooperative  Airway & Oxygen Therapy: Patient Spontanous Breathing and Patient connected to face mask oxygen  Post-op Assessment: Report given to RN and Post -op Vital signs reviewed and stable  Post vital signs: Reviewed and stable  Last Vitals:  Vitals Value Taken Time  BP    Temp    Pulse    Resp    SpO2      Last Pain:  Vitals:   08/23/20 1217  TempSrc: Axillary  PainSc: 0-No pain      Patients Stated Pain Goal: 0 (56/31/49 7026)  Complications: No complications documented.

## 2020-08-23 NOTE — Evaluation (Signed)
Physical Therapy Evaluation Patient Details Name: Elizabeth Haynes MRN: [id number] DOB: 10-18-1946 Today's Date: 08/23/2020   History of Present Illness  74 yo female admitted with GI bleed. Hx of osteopenia, osteoporosis  Clinical Impression  On eval, pt was Min guard assist for mobility. She walked ~75 feet with use of a RW. Pt presents with general weakness, decreased activity tolerance, and impaired gait and balance. Will plan to follow and progress activity as tolerated. Per chart, pt just recently completed a stay at SNF. Will recommend HHPT, HHOT f/u.     Follow Up Recommendations Home health PT;Home Health OT    Equipment Recommendations  None recommended by PT    Recommendations for Other Services       Precautions / Restrictions Precautions Precautions: Fall Restrictions Weight Bearing Restrictions: No      Mobility  Bed Mobility Overal bed mobility: Needs Assistance Bed Mobility: Supine to Sit;Sit to Supine     Supine to sit: Supervision;HOB elevated Sit to supine: Supervision;HOB elevated      Transfers Overall transfer level: Needs assistance Equipment used: Rolling walker (2 wheeled) Transfers: Sit to/from Stand Sit to Stand: Min guard         General transfer comment: Min guard for safety. Mildly unsteady.  Ambulation/Gait Ambulation/Gait assistance: Min guard Gait Distance (Feet): 75 Feet Assistive device: Rolling walker (2 wheeled) Gait Pattern/deviations: Step-through pattern;Decreased stride length     General Gait Details: Min guard for safety. Mildly unsteady. Mild lightheadedness.  Stairs            Wheelchair Mobility    Modified Rankin (Stroke Patients Only)       Balance Overall balance assessment: Needs assistance         Standing balance support: Bilateral upper extremity supported Standing balance-Leahy Scale: Poor                               Pertinent Vitals/Pain Pain Assessment: No/denies pain     Home Living Family/patient expects to be discharged to:: Private residence Living Arrangements: Alone   Type of Home: House       Home Layout: Able to live on main level with bedroom/bathroom Home Equipment: Dan Humphreys - 2 wheels      Prior Function Level of Independence: Independent               Hand Dominance        Extremity/Trunk Assessment   Upper Extremity Assessment Upper Extremity Assessment: Overall WFL for tasks assessed    Lower Extremity Assessment Lower Extremity Assessment: Generalized weakness    Cervical / Trunk Assessment Cervical / Trunk Assessment: Normal  Communication   Communication: No difficulties  Cognition Arousal/Alertness: Awake/alert Behavior During Therapy: WFL for tasks assessed/performed Overall Cognitive Status: Within Functional Limits for tasks assessed                                        General Comments      Exercises     Assessment/Plan    PT Assessment Patient needs continued PT services  PT Problem List Decreased strength;Decreased mobility;Decreased activity tolerance;Decreased balance;Decreased knowledge of use of DME       PT Treatment Interventions DME instruction;Gait training;Therapeutic activities;Therapeutic exercise;Patient/family education;Functional mobility training;Balance training    PT Goals (Current goals can be found in the Care Plan section)  Acute Rehab PT Goals Patient Stated Goal: to regain PLOF PT Goal Formulation: With patient Time For Goal Achievement: 09/06/20 Potential to Achieve Goals: Good    Frequency Min 3X/week   Barriers to discharge        Co-evaluation               AM-PAC PT "6 Clicks" Mobility  Outcome Measure Help needed turning from your back to your side while in a flat bed without using bedrails?: A Little Help needed moving from lying on your back to sitting on the side of a flat bed without using bedrails?: A Little Help needed  moving to and from a bed to a chair (including a wheelchair)?: A Little Help needed standing up from a chair using your arms (e.g., wheelchair or bedside chair)?: A Little Help needed to walk in hospital room?: A Little Help needed climbing 3-5 steps with a railing? : A Little 6 Click Score: 18    End of Session Equipment Utilized During Treatment: Gait belt Activity Tolerance: Patient tolerated treatment well Patient left: in bed;with call bell/phone within reach;with bed alarm set;with family/visitor present   PT Visit Diagnosis: Muscle weakness (generalized) (M62.81);Difficulty in walking, not elsewhere classified (R26.2);Unsteadiness on feet (R26.81);History of falling (Z91.81)    Time: 9211-9417 PT Time Calculation (min) (ACUTE ONLY): 25 min   Charges:   PT Evaluation $PT Eval Low Complexity: 1 Low PT Treatments $Gait Training: 8-22 mins           Faye Ramsay, PT Acute Rehabilitation  Office: [phone] Pager: [phone]

## 2020-08-23 NOTE — Interval H&P Note (Signed)
History and Physical Interval Note: 73/female with anemia, dark stools for an EGD today.  08/23/2020 1:32 PM  Elizabeth Haynes  has presented today for EGD, with the diagnosis of melena, anemia.  The various methods of treatment have been discussed with the patient and family. After consideration of risks, benefits and other options for treatment, the patient has consented to  Procedure(s): ESOPHAGOGASTRODUODENOSCOPY (EGD) WITH PROPOFOL (Left) as a surgical intervention.  The patient's history has been reviewed, patient examined, no change in status, stable for surgery.  I have reviewed the patient's chart and labs.  Questions were answered to the patient's satisfaction.     Kerin Salen

## 2020-08-23 NOTE — Progress Notes (Signed)
PROGRESS NOTE   BEAUX VERNE  NLZ:[id number]    DOB: [birth year]    DOA: 08/22/2020  PCP: Joselyn Arrow, MD   I have briefly reviewed patients previous medical records in [facility].  Chief Complaint  Patient presents with  . Weakness    Brief Narrative:  74 year old female with PMH of osteoporosis, osteopenia, recent rehab stay after fall with back injury, presented to the ED with complaints of progressive generalized weakness, decreased appetite, abdominal pain and syncopal episodes.  Patient's neighbor reported large, dark stool the day prior to admission and EDP noted maroon-colored stool on rectal exam which was guaiac positive.  Hemoglobin 5.6 on admission.  Admitted for symptomatic anemia due to suspected acute/subacute upper GI bleeding, acute blood loss anemia and syncope.  S/p 2 units PRBC transfusion.  [facility] consulted and plan EGD 9/27.   Assessment & Plan:  Active Problems:   GIB (gastrointestinal bleeding)   Symptomatic anemia secondary to acute GI blood loss: Hemoglobin 10.6 on 08/03/2020.  Presented with hemoglobin of 5.6.  S/p 2 units PRBC appropriately improved to 9.1 > 8.4.  Follow CBC closely and transfuse for hemoglobin 7 g or less.  Anemia panel: Iron 65, TIBC 236.  Will check ferritin.  Suspected acute/subacute upper GI bleed: As evidenced by reported melena, disproportionate elevation of BUN related to creatinine.  Denies NSAID use.  Continue IV PPI.  GI consulted and plan EGD 9/27.  We will also need screening colonoscopy at some point.  Syncope: Suspect due to symptomatic anemia.  Telemetry shows sinus rhythm and 8 beat NS SVT.  Continue telemetry monitoring.  Will check 2D echo.  Also check orthostatic vital signs.  Patient needs to be advised that she should not drive for 6 months, needs to be done prior to discharge.  CT head with chronic changes and no acute abnormalities.  Check TSH.  Acute lower UTI: Treated with Ceftin as outpatient without relief  of symptoms.  Symptoms then started to improve with Cipro.  Completing 3-day course.  Dehydration with hyponatremia: Serum sodium 128 on admission.  Improving on IV fluids.  Continue gentle IV fluids for additional 24 hours.  Falls: Related to weakness from symptomatic anemia and syncope.  PT recommends home health PT.  Body mass index is 17.38 kg/m.   DVT prophylaxis: SCDs Start: 08/22/20 1503     Code Status: DNR  Family Communication: None at bedside Disposition:  Status is: Inpatient  Remains inpatient appropriate because:Inpatient level of care appropriate due to severity of illness   Dispo: The patient is from: Home              Anticipated d/c is to: Home              Anticipated d/c date is: 2 days              Patient currently is not medically stable to d/c.        Consultants:   [facility]  Procedures:   None  Antimicrobials:    Anti-infectives (From admission, onward)   Start     Dose/Rate Route Frequency Ordered Stop   08/22/20 2000  ciprofloxacin (CIPRO) tablet 500 mg        500 mg Oral 2 times daily 08/22/20 1502 08/24/20 1959        Subjective:  Patient seen this morning prior to procedures.  Reports that she had multiple, 2-3 dark-colored stools since hospital admission.  Has some mid to lower  abdominal discomfort.  Denies nausea or vomiting.  Denies NSAID use.  Feels stronger.  No dizziness, lightheadedness or chest pain.  Objective:   Vitals:   08/22/20 2059 08/22/20 2108 08/23/20 0000 08/23/20 0416  BP: 109/60  122/68 111/71  Pulse: 85  82 77  Resp: 16  15 14   Temp: 98.6 F (37 C)  98.7 F (37.1 C) 99 F (37.2 C)  TempSrc: Oral  Oral Oral  SpO2: 98%  96% 95%  Weight:  43.1 kg    Height:  5\' 2"  (1.575 m)      General exam: Pleasant elderly female, small built, thinly nourished and chronically ill looking lying comfortably propped up in bed without distress. Respiratory system: Clear to auscultation. Respiratory effort  normal. Cardiovascular system: S1 & S2 heard, RRR. No JVD, murmurs, rubs, gallops or clicks. No pedal edema.  Telemetry personally reviewed: Sinus rhythm.  8 beat NS SVT noted. Gastrointestinal system: Abdomen is nondistended, soft.  Mild periumbilical region tenderness without rigidity, guarding or rebound. No organomegaly or masses felt. Normal bowel sounds heard. Central nervous system: Alert and oriented. No focal neurological deficits. Extremities: Symmetric 5 x 5 power. Skin: No rashes, lesions or ulcers Psychiatry: Judgement and insight appear normal. Mood & affect appropriate.     Data Reviewed:   I have personally reviewed following labs and imaging studies   CBC: Recent Labs  Lab 08/22/20 1030 08/23/20 0220 08/23/20 1027  WBC 9.7 10.6*  --   NEUTROABS 7.6  --   --   HGB 5.6* 9.1* 8.4*  HCT 16.6* 25.4* 23.9*  MCV 91.7 88.2  --   PLT 485* 325  --     Basic Metabolic Panel: Recent Labs  Lab 08/22/20 1030 08/23/20 0220  NA 128* 131*  K 4.4 3.7  CL 92* 100  CO2 27 24  GLUCOSE 120* 106*  BUN 48* 27*  CREATININE 0.62 0.44  CALCIUM 8.8* 8.0*    Liver Function Tests: Recent Labs  Lab 08/23/20 0220  AST 14*  ALT 14  ALKPHOS 76  BILITOT 0.9  PROT 5.1*  ALBUMIN 2.5*    CBG: No results for input(s): GLUCAP in the last 168 hours.  Microbiology Studies:   Recent Results (from the past 240 hour(s))  Respiratory Panel by RT PCR (Flu A&B, Covid) - Nasopharyngeal Swab     Status: None   Collection Time: 08/22/20 12:23 PM   Specimen: Nasopharyngeal Swab  Result Value Ref Range Status   SARS Coronavirus 2 by RT PCR NEGATIVE NEGATIVE Final    Comment: (NOTE) SARS-CoV-2 target nucleic acids are NOT DETECTED.  The SARS-CoV-2 RNA is generally detectable in upper respiratoy specimens during the acute phase of infection. The lowest concentration of SARS-CoV-2 viral copies this assay can detect is 131 copies/mL. A negative result does not preclude  SARS-Cov-2 infection and should not be used as the sole basis for treatment or other patient management decisions. A negative result may occur with  improper specimen collection/handling, submission of specimen other than nasopharyngeal swab, presence of viral mutation(s) within the areas targeted by this assay, and inadequate number of viral copies (<131 copies/mL). A negative result must be combined with clinical observations, patient history, and epidemiological information. The expected result is Negative.  Fact Sheet for Patients:  08/25/20  Fact Sheet for Healthcare Providers:  08/24/20  This test is no t yet approved or cleared by the https://www.moore.com/ FDA and  has been authorized for detection and/or diagnosis of SARS-CoV-2 by  FDA under an Emergency Use Authorization (EUA). This EUA will remain  in effect (meaning this test can be used) for the duration of the COVID-19 declaration under Section 564(b)(1) of the Act, 21 U.S.C. section 360bbb-3(b)(1), unless the authorization is terminated or revoked sooner.     Influenza A by PCR NEGATIVE NEGATIVE Final   Influenza B by PCR NEGATIVE NEGATIVE Final    Comment: (NOTE) The Xpert Xpress SARS-CoV-2/FLU/RSV assay is intended as an aid in  the diagnosis of influenza from Nasopharyngeal swab specimens and  should not be used as a sole basis for treatment. Nasal washings and  aspirates are unacceptable for Xpert Xpress SARS-CoV-2/FLU/RSV  testing.  Fact Sheet for Patients: https://www.moore.com/  Fact Sheet for Healthcare Providers: https://www.young.biz/  This test is not yet approved or cleared by the [place] FDA and  has been authorized for detection and/or diagnosis of SARS-CoV-2 by  FDA under an Emergency Use Authorization (EUA). This EUA will remain  in effect (meaning this test can be used) for the duration of the   Covid-19 declaration under Section 564(b)(1) of the Act, 21  U.S.C. section 360bbb-3(b)(1), unless the authorization is  terminated or revoked. Performed at [facility], [address], [place], [place] [address]      Radiology Studies:  DG Chest 2 View  Result Date: 08/22/2020 CLINICAL DATA:  Syncope. EXAM: CHEST - 2 VIEW COMPARISON:  None. FINDINGS: Cardiomediastinal silhouette is normal. Mediastinal contours appear intact. There is no evidence of focal airspace consolidation, pleural effusion or pneumothorax. Osseous structures are without acute abnormality. Soft tissues are grossly normal. IMPRESSION: No active cardiopulmonary disease. Electronically Signed   By: Ted Mcalpine M.D.   On: 08/22/2020 11:52   CT Head Wo Contrast  Result Date: 08/22/2020 CLINICAL DATA:  Recent syncopal episode EXAM: CT HEAD WITHOUT CONTRAST TECHNIQUE: Contiguous axial images were obtained from the base of the skull through the vertex without intravenous contrast. COMPARISON:  None. FINDINGS: Brain: Mild atrophic changes and chronic white matter ischemic changes are noted. No findings to suggest acute hemorrhage, acute infarction or space-occupying mass lesion are noted. Vascular: No hyperdense vessel or unexpected calcification. Skull: Normal. Negative for fracture or focal lesion. Sinuses/Orbits: No acute finding. Other: None. IMPRESSION: Chronic atrophic and ischemic changes without acute abnormality. Electronically Signed   By: Alcide Clever M.D.   On: 08/22/2020 12:05     Scheduled Meds:   . ciprofloxacin  500 mg Oral BID  . pantoprazole (PROTONIX) IV  40 mg Intravenous BID    Continuous Infusions:   . sodium chloride       LOS: 1 day     Marcellus Scott, MD, [place], [facility]. [facility]    To contact the attending provider between 7A-7P or the covering provider during after hours 7P-7A, please log into the web site [email or web address] and access using universal [facility] password for that web site. If you do not have the password, please call the hospital operator.  08/23/2020, 11:40 AM

## 2020-08-23 NOTE — Progress Notes (Signed)
  Echocardiogram 2D Echocardiogram was attempted but patient was in Endoscopy.  Elizabeth Haynes 08/23/2020, 2:23 PM

## 2020-08-23 NOTE — Significant Event (Signed)
Rapid Response Event Note   Reason for Call :  Patient placed on bedside commode for bowel movement. While patient was using the bathroom, she started to feel "faint". Endorsed dizziness, lightheadedness, weakness. Patient placed back in bed and rapid response called.   Initial Focused Assessment:  Patient alert and oriented in bed in trendelenburg. Blood pressure stable but soft at 109/67. Heart rhythm in a sinus rhythm in the 80's. Patient is calm at this time. Stayed in the room to ensure patient safety. Recycled blood pressure 95/57 (68).   Interventions:  Patient has an additional unit of blood to receive. Bedside nurse instructed to administer blood and call rapid response back for hypotension (MAP >65), tachycardia, changes in level of consciousness, or a drop in hemoglobin after additional blood administration.   Plan of Care:  Continue to assess on the floor. Monitor for s/s of bleeding.    Event Summary:   MD NotifiedKatherina Right @ 2130 Call Time: 2132  Arrival Time: 2135 End Time: 2150  Larence Penning, RN

## 2020-08-23 NOTE — Progress Notes (Signed)
Addendum:  Earlier this afternoon Dr. Pati Gallo called me with EGD results which were the following:  Large cratered duodenal ulcer, pigmentation and visible vessel, not amenable to endoscopic therapy.  She advised me that she had placed a consult in for interventional radiology for embolization of the GDA.  I personally called and spoke with Dr. Loreta Ave, IR regarding this consult.  Repeat hemoglobin at 5 PM: 6.6.  Ordered 2 units PRBC transfusion with close monitoring of CBCs.  I again discussed with Dr. Marca Ancona who advised changing PPI to IV Protonix infusion without bolus.  Close follow-up.  I was unable to reach patient's daughter via phone to update, left VM message to call back.  Marcellus Scott, MD, [place], [facility]. [facility]  To contact the attending provider between 7A-7P or the covering provider during after hours 7P-7A, please log into the web site [email or web address] and access using universal [place] password for that web site. If you do not have the password, please call the hospital operator.

## 2020-08-23 NOTE — Op Note (Signed)
[facility] Patient Name: Elizabeth Haynes Procedure Date: 08/23/2020 MRN: [id number] Attending MD: Kerin Salen , MD Date of Birth: Dec 16, 1945 CSN: [id number] Age: 74 Admit Type: Inpatient Procedure:                Upper GI endoscopy Indications:              Acute post hemorrhagic anemia, Melena Providers:                Kerin Salen, MD, Alison Murray, RN, Kandice Robinsons,                            Technician Referring MD:             [facility] Medicines:                Monitored Anesthesia Care Complications:            No immediate complications. Estimated blood loss:                            Minimal. Estimated Blood Loss:     Estimated blood loss was minimal. Procedure:                Pre-Anesthesia Assessment:                           - Prior to the procedure, a History and Physical                            was performed, and patient medications and                            allergies were reviewed. The patient's tolerance of                            previous anesthesia was also reviewed. The risks                            and benefits of the procedure and the sedation                            options and risks were discussed with the patient.                            All questions were answered, and informed consent                            was obtained. Prior Anticoagulants: The patient has                            taken no previous anticoagulant or antiplatelet                            agents. ASA Grade Assessment: III - A patient with  severe systemic disease. After reviewing the risks                            and benefits, the patient was deemed in                            satisfactory condition to undergo the procedure.                           After obtaining informed consent, the endoscope was                            passed under direct vision. Throughout the                            procedure, the  patient's blood pressure, pulse, and                            oxygen saturations were monitored continuously. The                            GIF-H190 ([id number] Olympus gastroscope was                            introduced through the mouth, and advanced to the                            second part of duodenum. The upper GI endoscopy was                            accomplished without difficulty. The patient                            tolerated the procedure well. Scope In: Scope Out: Findings:      The examined esophagus was normal.      The Z-line was regular and was found 40 cm from the incisors.      Localized mildly erythematous mucosa without bleeding was found in the       gastric antrum. Biopsies were taken with a cold forceps for Helicobacter       pylori testing.      The cardia and gastric fundus were normal on retroflexion.      Few non-bleeding superficial gastric ulcers with no stigmata of bleeding       were found in the gastric antrum.      One non-bleeding cratered duodenal ulcer with pigmented material and       visible vessel was found in the first portion of the duodenum. The       lesion was at least 3 cm in size. As the scope was advanced into the       duodenum, minimal oozing was noted from the periphery. This area was       successfully injected with 4 mL of a 1:10,000 solution of epinephrine       for hemostasis. Impression:               - Normal esophagus.                           -  Z-line regular, 40 cm from the incisors.                           - Erythematous mucosa in the antrum. Biopsied.                           - Non-bleeding gastric ulcers with no stigmata of                            bleeding.                           - Non-bleeding duodenal ulcer with pigmented                            material and visible vessel. Injected. Moderate Sedation:      Patient did not receive moderate sedation for this procedure, but       instead received  monitored anesthesia care. Recommendation:           - NPO.                           - Refer to an interventional radiologist today for                            empiric embolization of GDA.                           - PPI BID, monitor H and H and transfuse as needed. Procedure Code(s):        --- Professional ---                           [phone], 59, Esophagogastroduodenoscopy, flexible,                            transoral; with control of bleeding, any method                           43239, Esophagogastroduodenoscopy, flexible,                            transoral; with biopsy, single or multiple Diagnosis Code(s):        --- Professional ---                           K31.89, Other diseases of stomach and duodenum                           K25.9, Gastric ulcer, unspecified as acute or                            chronic, without hemorrhage or perforation                           K26.9, Duodenal ulcer, unspecified as acute or  chronic, without hemorrhage or perforation                           D62, Acute posthemorrhagic anemia                           K92.1, Melena (includes Hematochezia) CPT copyright 2019 American Medical Association. All rights reserved. The codes documented in this report are preliminary and upon coder review may  be revised to meet current compliance requirements. Kerin Salen, MD 08/23/2020 2:17:45 PM This report has been signed electronically. Number of Addenda: 0

## 2020-08-23 NOTE — Consult Note (Addendum)
Chief Complaint: Patient was seen in consultation today for mesenteric/visceral arteriogram with embolization Chief Complaint  Patient presents with  . Weakness    Referring Physician(s): Burnard Bunting  Supervising Physician: Malachy Moan  Patient Status: [facility] - In-pt  History of Present Illness: Elizabeth Haynes is a 73 y.o. female with history of recent rehab stay after fall with back injury who was admitted to [facility] on 9/26 with progressive weakness, syncopal episode and dark bloody stools.  No hematemesis or prior GI bleeding/NSAID/blood thinner use/previous colonoscopy or endoscopy. Both  CT head and chest x-ray were without acute abnormalities.  Endoscopy performed today revealed normal esophagus, erythematous mucosa in the antrum which was biopsied, nonbleeding gastric ulcers with no stigmata of bleeding, nonbleeding duodenal ulcer with pigmented material and visible vessel which was injected.  Patient has continued to have dark bloody bowel movements since admission.  Current labs include WBC 10.6, hemoglobin 9.1, platelets 325k, creatinine 0.44, PT/INR pending. She is COVID-19 negative.  Request now received from GI service for prophylactic GDA embolization.   Past Medical History:  Diagnosis Date  . Osteopenia   . Osteoporosis 09/2016  . Rosacea   . Symptomatic menopausal or female climacteric states     Past Surgical History:  Procedure Laterality Date  . TONSILLECTOMY AND ADENOIDECTOMY  age 9  . TUBAL LIGATION      Allergies: Tramadol and Bactrim [sulfamethoxazole-trimethoprim]  Medications: Prior to Admission medications   Medication Sig Start Date End Date Taking? Authorizing Provider  Azelaic Acid (FINACEA EX) Apply topically.   Yes [provider]  b complex vitamins capsule Take 1 capsule by mouth daily.   Yes [provider]  calcium carbonate 1250 MG capsule Take 1,250 mg by mouth 2 (two) times daily with a meal.   Yes  [provider]  cholecalciferol (VITAMIN D) 1000 UNITS tablet Take 1,000 Units by mouth daily.   Yes [provider]  ciprofloxacin (CIPRO) 500 MG tablet Take 1 tablet (500 mg total) by mouth 2 (two) times daily. 08/21/20  Yes Ronnald Nian, MD  HYDROcodone-acetaminophen (NORCO/VICODIN) 5-325 MG tablet Take 1 tablet by mouth 4 (four) times daily as needed for pain. 07/30/20  Yes [provider]  Multiple Vitamins-Minerals (MULTIVITAMIN WITH MINERALS) tablet Take 1 tablet by mouth daily.   Yes [provider]  olopatadine (PATADAY) 0.1 % ophthalmic solution Place 1 drop into both eyes daily as needed for allergies.   Yes [provider]  vitamin E 1000 UNIT capsule Take 1,000 Units by mouth daily.   Yes [provider]     Family History  Problem Relation Age of Onset  . Dementia Mother   . Hip fracture Mother        x 2  . Alzheimer's disease Mother   . Hypertension Mother   . Heart disease Mother        pacemaker  . Cancer Father        thyroid,lung,prostate  . Diabetes Other   . Breast cancer Maternal Grandmother   . Heart attack Maternal Grandfather     Social History   Socioeconomic History  . Marital status: Single    Spouse name: Not on file  . Number of children: Not on file  . Years of education: Not on file  . Highest education level: Not on file  Occupational History  . Not on file  Tobacco Use  . Smoking status: Never Smoker  . Smokeless tobacco: Never Used  Vaping Use  . Vaping Use: Never used  Substance and Sexual Activity  . Alcohol use: No  . Drug use: No  . Sexual activity: Not Currently  Other Topics Concern  . Not on file  Social History Narrative   Lives with dog.  2 stepchildren ([place] and [place]).  Widowed. 8 grandchildren, 13 great grandchildren   Social Determinants of Corporate investment banker Strain:   . Difficulty of Paying Living Expenses: Not on file  Food Insecurity:   .  Worried About Programme researcher, broadcasting/film/video in the Last Year: Not on file  . Ran Out of Food in the Last Year: Not on file  Transportation Needs:   . Lack of Transportation (Medical): Not on file  . Lack of Transportation (Non-Medical): Not on file  Physical Activity:   . Days of Exercise per Week: Not on file  . Minutes of Exercise per Session: Not on file  Stress:   . Feeling of Stress : Not on file  Social Connections:   . Frequency of Communication with Friends and Family: Not on file  . Frequency of Social Gatherings with Friends and Family: Not on file  . Attends Religious Services: Not on file  . Active Member of Clubs or Organizations: Not on file  . Attends Banker Meetings: Not on file  . Marital Status: Not on file      Review of Systems currently denies fever, headache, chest pain, dyspnea, cough, back pain, nausea, vomiting.  She does continue to have some intermittent abdominal cramping and dark bloody stools.  Vital Signs: BP 112/72 (BP Location: Right Arm)   Pulse 78   Temp 98.6 F (37 C) (Oral)   Resp 18   Ht 5\' 2"  (1.575 m)   Wt 95 lb 0.3 oz (43.1 kg)   SpO2 94%   BMI 17.38 kg/m   Physical Exam awake, alert.  Chest clear to auscultation bilaterally.  Heart with RRR; abd- soft, +BS, currently NT; no LE edema  Imaging: DG Chest 2 View  Result Date: 08/22/2020 CLINICAL DATA:  Syncope. EXAM: CHEST - 2 VIEW COMPARISON:  None. FINDINGS: Cardiomediastinal silhouette is normal. Mediastinal contours appear intact. There is no evidence of focal airspace consolidation, pleural effusion or pneumothorax. Osseous structures are without acute abnormality. Soft tissues are grossly normal. IMPRESSION: No active cardiopulmonary disease. Electronically Signed   By: 08/24/2020 M.D.   On: 08/22/2020 11:52   CT Head Wo Contrast  Result Date: 08/22/2020 CLINICAL DATA:  Recent syncopal episode EXAM: CT HEAD WITHOUT CONTRAST TECHNIQUE: Contiguous axial images were  obtained from the base of the skull through the vertex without intravenous contrast. COMPARISON:  None. FINDINGS: Brain: Mild atrophic changes and chronic white matter ischemic changes are noted. No findings to suggest acute hemorrhage, acute infarction or space-occupying mass lesion are noted. Vascular: No hyperdense vessel or unexpected calcification. Skull: Normal. Negative for fracture or focal lesion. Sinuses/Orbits: No acute finding. Other: None. IMPRESSION: Chronic atrophic and ischemic changes without acute abnormality. Electronically Signed   By: 08/24/2020 M.D.   On: 08/22/2020 12:05    Labs:  CBC: Recent Labs    08/22/20 1030 08/23/20 0220 08/23/20 1027  WBC 9.7 10.6*  --   HGB 5.6* 9.1* 8.4*  HCT 16.6* 25.4* 23.9*  PLT 485* 325  --     COAGS: No results for input(s): INR, APTT in the last 8760 hours.  BMP: Recent Labs    08/22/20 1030 08/23/20 0220  NA 128* 131*  K 4.4 3.7  CL 92* 100  CO2 27 24  GLUCOSE 120* 106*  BUN 48* 27*  CALCIUM 8.8* 8.0*  CREATININE 0.62 0.44  GFRNONAA >60 >60  GFRAA >60 >60    LIVER FUNCTION TESTS: Recent Labs    08/23/20 0220  BILITOT 0.9  AST 14*  ALT 14  ALKPHOS 76  PROT 5.1*  ALBUMIN 2.5*    TUMOR MARKERS: No results for input(s): AFPTM, CEA, CA199, CHROMGRNA in the last 8760 hours.  Assessment and Plan: 74 y.o. female with history of recent rehab stay after fall with back injury who was admitted to [facility] on 9/26 with progressive weakness, syncopal episode and dark bloody stools.  No hematemesis or prior GI bleeding/NSAID/blood thinner use/previous colonoscopy or endoscopy. Both  CT head and chest x-ray were without acute abnormalities.  Endoscopy performed today revealed normal esophagus, erythematous mucosa in the antrum which was biopsied, nonbleeding gastric ulcers with no stigmata of bleeding, nonbleeding duodenal ulcer with pigmented material and visible vessel which was injected.  Patient has  continued to have dark bloody bowel movements since admission.  Current labs include WBC 10.6, hemoglobin 9.1, platelets 325k, creatinine 0.44, PT/INR pending. She is COVID-19 negative.  Request now received from GI service for prophylactic GDA embolization.  Patient is afebrile and current vitals are stable.  Case has been reviewed by Drs. Brent Bulla and d/w Dr. Marca Ancona.Risks and benefits of procedure were discussed with the patient including, but not limited to bleeding, infection, vascular injury or contrast induced renal failure.  This interventional procedure involves the use of X-rays and because of the nature of the planned procedure, it is possible that we will have prolonged use of X-ray fluoroscopy.  Potential radiation risks to you include (but are not limited to) the following: - A slightly elevated risk for cancer  several years later in life. This risk is typically less than 0.5% percent. This risk is low in comparison to the normal incidence of human cancer, which is 33% for women and 50% for men according to the American [employer] Society. - Radiation induced injury can include skin redness, resembling a rash, tissue breakdown / ulcers and hair loss (which can be temporary or permanent).   The likelihood of either of these occurring depends on the difficulty of the procedure and whether you are sensitive to radiation due to previous procedures, disease, or genetic conditions.   IF your procedure requires a prolonged use of radiation, you will be notified and given written instructions for further action.  It is your responsibility to monitor the irradiated area for the 2 weeks following the procedure and to notify your physician if you are concerned that you have suffered a radiation induced injury.    All of the patient's questions were answered, patient is agreeable to proceed.  Consent signed and in chart.  Procedure scheduled for 9/28    Thank you for this interesting  consult.  I greatly enjoyed meeting ARDELLE HALIBURTON and look forward to participating in their care.  A copy of this report was sent to the requesting provider on this date.  Electronically Signed: D. Jeananne Rama, PA-C 08/23/2020, 3:38 PM   I spent a total of 30 minutes in face to face in clinical consultation, greater than 50% of which was counseling/coordinating care for mesenteric/visceral arteriogram with embolization

## 2020-08-23 NOTE — Progress Notes (Addendum)
Patient was transferred to bedside commode and immediately felt light-headed, faint and limp. Called charge RN to come to room transferred back to bed and placed in trendelenburg position. Patient did not lose consciousness but felt like she was going to faint. Called RRT RN to assess patient. Cycled VSs a couple of times while RRT RN in room. Patient is here for GI bleed and is having BM in bed. She already has 2nd unit of blood ordered to transfuse and tomorrow schedule for embolization. Also, getting ready to start protonix d/t now having 2nd IV placed by IV team. Paged M. Katherina Right.  M. Katherina Right, NP ordered stat H&H at 0130 but blood was almost complete at that time. Asked her if waiting 2 hours after completion to check lab was sufficient. Will continue to monitor.

## 2020-08-23 NOTE — Anesthesia Postprocedure Evaluation (Signed)
Anesthesia Post Note  Patient: Elizabeth Haynes  Procedure(s) Performed: ESOPHAGOGASTRODUODENOSCOPY (EGD) WITH PROPOFOL (Left ) BIOPSY SCLEROTHERAPY     Patient location during evaluation: PACU Anesthesia Type: MAC Level of consciousness: awake and alert Pain management: pain level controlled Vital Signs Assessment: post-procedure vital signs reviewed and stable Respiratory status: spontaneous breathing Cardiovascular status: stable Anesthetic complications: no   No complications documented.  Last Vitals:  Vitals:   08/23/20 1420 08/23/20 1450  BP: (!) 111/46 112/72  Pulse:  78  Resp: (!) 22 18  Temp:  37 C  SpO2: 96% 94%    Last Pain:  Vitals:   08/23/20 1450  TempSrc: Oral  PainSc:                  Nolon Nations

## 2020-08-24 ENCOUNTER — Encounter (HOSPITAL_COMMUNITY): Payer: Self-pay | Admitting: Gastroenterology

## 2020-08-24 ENCOUNTER — Other Ambulatory Visit: Payer: Self-pay

## 2020-08-24 ENCOUNTER — Inpatient Hospital Stay (HOSPITAL_COMMUNITY): Payer: Medicare Other

## 2020-08-24 ENCOUNTER — Telehealth: Payer: Self-pay

## 2020-08-24 HISTORY — PX: IR ANGIOGRAM SELECTIVE EACH ADDITIONAL VESSEL: IMG667

## 2020-08-24 HISTORY — PX: IR ANGIOGRAM VISCERAL SELECTIVE: IMG657

## 2020-08-24 HISTORY — PX: IR US GUIDE VASC ACCESS RIGHT: IMG2390

## 2020-08-24 HISTORY — PX: IR EMBO ART  VEN HEMORR LYMPH EXTRAV  INC GUIDE ROADMAPPING: IMG5450

## 2020-08-24 LAB — CBC
HCT: 21.3 % — ABNORMAL LOW (ref 36.0–46.0)
HCT: 18.2 % — ABNORMAL LOW (ref 36.0–46.0)
Hemoglobin: 7.5 g/dL — ABNORMAL LOW (ref 12.0–15.0)
Hemoglobin: 6.2 g/dL — CL (ref 12.0–15.0)
MCH: 32.9 pg (ref 26.0–34.0)
MCH: 32.1 pg (ref 26.0–34.0)
MCHC: 35.2 g/dL (ref 30.0–36.0)
MCHC: 34.1 g/dL (ref 30.0–36.0)
MCV: 93.4 fL (ref 80.0–100.0)
MCV: 94.3 fL (ref 80.0–100.0)
Platelets: 213 10*3/uL (ref 150–400)
Platelets: 187 10*3/uL (ref 150–400)
RBC: 2.28 MIL/uL — ABNORMAL LOW (ref 3.87–5.11)
RBC: 1.93 MIL/uL — ABNORMAL LOW (ref 3.87–5.11)
RDW: 14.4 % (ref 11.5–15.5)
RDW: 14.6 % (ref 11.5–15.5)
WBC: 13.4 10*3/uL — ABNORMAL HIGH (ref 4.0–10.5)
WBC: 13.5 10*3/uL — ABNORMAL HIGH (ref 4.0–10.5)
nRBC: 0.2 % (ref 0.0–0.2)
nRBC: 0.1 % (ref 0.0–0.2)

## 2020-08-24 LAB — URINE CULTURE: Culture: <10000 — AB

## 2020-08-24 LAB — SURGICAL PATHOLOGY

## 2020-08-24 LAB — BASIC METABOLIC PANEL
Anion gap: 6 (ref 5–15)
BUN: 32 mg/dL — ABNORMAL HIGH (ref 8–23)
CO2: 26 mmol/L (ref 22–32)
Calcium: 7.8 mg/dL — ABNORMAL LOW (ref 8.9–10.3)
Chloride: 100 mmol/L (ref 98–111)
Creatinine, Ser: 0.45 mg/dL (ref 0.44–1.00)
GFR calc Af Amer: >60 mL/min (ref >60)
GFR calc non Af Amer: >60 mL/min (ref >60)
Glucose, Bld: 120 mg/dL — ABNORMAL HIGH (ref 70–99)
Potassium: 3.6 mmol/L (ref 3.5–5.1)
Sodium: 132 mmol/L — ABNORMAL LOW (ref 135–145)

## 2020-08-24 LAB — CBC WITH DIFFERENTIAL/PLATELET
Abs Immature Granulocytes: 0.21 10*3/uL — ABNORMAL HIGH (ref 0.00–0.07)
Basophils Absolute: 0 10*3/uL (ref 0.0–0.1)
Basophils Relative: 0 %
Eosinophils Absolute: 0.3 10*3/uL (ref 0.0–0.5)
Eosinophils Relative: 2 %
HCT: 26.5 % — ABNORMAL LOW (ref 36.0–46.0)
Hemoglobin: 9.4 g/dL — ABNORMAL LOW (ref 12.0–15.0)
Immature Granulocytes: 2 %
Lymphocytes Relative: 15 %
Lymphs Abs: 2.1 10*3/uL (ref 0.7–4.0)
MCH: 32.4 pg (ref 26.0–34.0)
MCHC: 35.5 g/dL (ref 30.0–36.0)
MCV: 91.4 fL (ref 80.0–100.0)
Monocytes Absolute: 1.1 10*3/uL — ABNORMAL HIGH (ref 0.1–1.0)
Monocytes Relative: 8 %
Neutro Abs: 10.1 10*3/uL — ABNORMAL HIGH (ref 1.7–7.7)
Neutrophils Relative %: 73 %
Platelets: 214 10*3/uL (ref 150–400)
RBC: 2.9 MIL/uL — ABNORMAL LOW (ref 3.87–5.11)
RDW: 14 % (ref 11.5–15.5)
WBC: 13.8 10*3/uL — ABNORMAL HIGH (ref 4.0–10.5)
nRBC: 0.2 % (ref 0.0–0.2)

## 2020-08-24 LAB — PREPARE RBC (CROSSMATCH)

## 2020-08-24 LAB — PROTIME-INR
INR: 1.2 (ref 0.8–1.2)
Prothrombin Time: 14.4 seconds (ref 11.4–15.2)

## 2020-08-24 MED ORDER — LIDOCAINE HCL (PF) 1 % IJ SOLN
INTRAMUSCULAR | Status: AC | PRN
  Administered 2020-08-24: 5 mL

## 2020-08-24 MED ORDER — MORPHINE SULFATE (PF) 2 MG/ML IV SOLN
2.0000 mg | Freq: Once | INTRAVENOUS | Status: AC | PRN
  Administered 2020-08-24: 2 mg via INTRAVENOUS

## 2020-08-24 MED ORDER — SODIUM CHLORIDE 0.9% IV SOLUTION: Freq: Once | INTRAVENOUS | Status: AC

## 2020-08-24 MED ORDER — FENTANYL CITRATE (PF) 100 MCG/2ML IJ SOLN
INTRAMUSCULAR | Status: AC | PRN
  Administered 2020-08-24 (×2): 50 ug via INTRAVENOUS

## 2020-08-24 MED ORDER — MORPHINE SULFATE (PF) 2 MG/ML IV SOLN
2.0000 mg | Freq: Once | INTRAVENOUS | Status: DC | PRN
  Filled 2020-08-24: qty 1

## 2020-08-24 MED ORDER — LIDOCAINE HCL 1 % IJ SOLN
INTRAMUSCULAR | Status: AC
  Filled 2020-08-24: qty 20

## 2020-08-24 MED ORDER — FENTANYL CITRATE (PF) 100 MCG/2ML IJ SOLN
INTRAMUSCULAR | Status: AC
  Filled 2020-08-24: qty 2

## 2020-08-24 MED ORDER — MIDAZOLAM HCL 2 MG/2ML IJ SOLN
INTRAMUSCULAR | Status: AC | PRN
  Administered 2020-08-24 (×2): 1 mg via INTRAVENOUS

## 2020-08-24 MED ORDER — MIDAZOLAM HCL 2 MG/2ML IJ SOLN
INTRAMUSCULAR | Status: AC
  Filled 2020-08-24: qty 4

## 2020-08-24 MED ORDER — MELATONIN 3 MG PO TABS
6.0000 mg | ORAL_TABLET | Freq: Every evening | ORAL | Status: DC | PRN
  Administered 2020-08-26: 6 mg via ORAL
  Filled 2020-08-24: qty 2

## 2020-08-24 MED ORDER — IOHEXOL 300 MG/ML  SOLN
100.0000 mL | Freq: Once | INTRAMUSCULAR | Status: AC | PRN
  Administered 2020-08-24: 45 mL via INTRA_ARTERIAL

## 2020-08-24 MED ORDER — IOHEXOL 300 MG/ML  SOLN: 100.0000 mL | Freq: Once | INTRAMUSCULAR | Status: DC | PRN

## 2020-08-24 NOTE — Progress Notes (Signed)
Subjective: Patient reports having multiple black bowel movements yesterday, overnight and today morning. She denies abdominal pain.  Objective: Vital signs in last 24 hours: Temp:  [97.8 F (36.6 C)-99.4 F (37.4 C)] 98.6 F (37 C) (09/28 0612) Pulse Rate:  [78-90] 90 (09/28 0612) Resp:  [16-22] 17 (09/28 0612) BP: (88-124)/(46-80) 104/63 (09/28 0612) SpO2:  [94 %-100 %] 98 % (09/28 0612) Weight:  [43.1 kg] 43.1 kg (09/27 1217) Weight change: 0 kg Last BM Date: 08/22/20  PE: Thinly built, prominent pallor GENERAL: In mild distress, able to speak in full sentences ABDOMEN: Soft, nondistended, nontender, normoactive bowel sounds EXTREMITIES: No deformity, no edema  Lab Results: Results for orders placed or performed during the hospital encounter of 08/22/20 (from the past 48 hour(s))  Basic metabolic panel     Status: Abnormal   Collection Time: 08/22/20 10:30 AM  Result Value Ref Range   Sodium 128 (L) 135 - 145 mmol/L   Potassium 4.4 3.5 - 5.1 mmol/L   Chloride 92 (L) 98 - 111 mmol/L   CO2 27 22 - 32 mmol/L   Glucose, Bld 120 (H) 70 - 99 mg/dL    Comment: Glucose reference range applies only to samples taken after fasting for at least 8 hours.   BUN 48 (H) 8 - 23 mg/dL   Creatinine, Ser 1.320.62 0.44 - 1.00 mg/dL   Calcium 8.8 (L) 8.9 - 10.3 mg/dL   GFR calc non Af Amer >60 >60 mL/min   GFR calc Af Amer >60 >60 mL/min   Anion gap 9 5 - 15    Comment: Performed at [facility], [address] [place], [place] [address]  CBC with Differential     Status: Abnormal   Collection Time: 08/22/20 10:30 AM  Result Value Ref Range   WBC 9.7 4.0 - 10.5 K/uL   RBC 1.81 (L) 3.87 - 5.11 MIL/uL   Hemoglobin 5.6 (LL) 12.0 - 15.0 g/dL    Comment: This critical result has verified and been called to GARRISON,G by Georgia DomKennedy Jackson on 09 26 2021 at 1123, and has been read back. CRITICAL RESULT VERIFIED   HCT 16.6 (L) 36 - 46 %   MCV 91.7 80.0 - 100.0 fL   MCH 30.9 26.0 -  34.0 pg   MCHC 33.7 30.0 - 36.0 g/dL   RDW 27.212.0 53.611.5 - 64.415.5 %   Platelets 485 (H) 150 - 400 K/uL   nRBC 0.0 0.0 - 0.2 %   Neutrophils Relative % 79 %   Neutro Abs 7.6 1.7 - 7.7 K/uL   Lymphocytes Relative 14 %   Lymphs Abs 1.4 0.7 - 4.0 K/uL   Monocytes Relative 6 %   Monocytes Absolute 0.6 0 - 1 K/uL   Eosinophils Relative 0 %   Eosinophils Absolute 0.0 0 - 0 K/uL   Basophils Relative 0 %   Basophils Absolute 0.0 0 - 0 K/uL   Immature Granulocytes 1 %   Abs Immature Granulocytes 0.09 (H) 0.00 - 0.07 K/uL    Comment: Performed at [facility], [address] [place], [place] [address]  Type and screen [facility]     Status: None   Collection Time: 08/22/20 11:25 AM  Result Value Ref Range   ABO/RH(D) A POS    Antibody Screen NEG    Sample Expiration 08/25/2020,2359    Unit Number [id number]    Blood Component Type RED CELLS,LR    Unit division 00  Status of Unit ISSUED,FINAL    Transfusion Status OK TO TRANSFUSE    Crossmatch Result Compatible    Unit Number [id number]    Blood Component Type RED CELLS,LR    Unit division 00    Status of Unit ISSUED,FINAL    Transfusion Status OK TO TRANSFUSE    Crossmatch Result Compatible    Unit Number [id number]    Blood Component Type RED CELLS,LR    Unit division 00    Status of Unit ISSUED,FINAL    Transfusion Status OK TO TRANSFUSE    Crossmatch Result      Compatible Performed at [facility], [address], [place], [place] [address]    Unit Number [id number]    Blood Component Type RED CELLS,LR    Unit division 00    Status of Unit ISSUED,FINAL    Transfusion Status OK TO TRANSFUSE    Crossmatch Result Compatible   POC occult blood, ED     Status: Abnormal   Collection Time: 08/22/20 12:08 PM  Result Value Ref Range   Fecal Occult Bld POSITIVE (A) NEGATIVE  Respiratory Panel by RT PCR (Flu A&B, Covid) - Nasopharyngeal Swab     Status: None    Collection Time: 08/22/20 12:23 PM   Specimen: Nasopharyngeal Swab  Result Value Ref Range   SARS Coronavirus 2 by RT PCR NEGATIVE NEGATIVE    Comment: (NOTE) SARS-CoV-2 target nucleic acids are NOT DETECTED.  The SARS-CoV-2 RNA is generally detectable in upper respiratoy specimens during the acute phase of infection. The lowest concentration of SARS-CoV-2 viral copies this assay can detect is 131 copies/mL. A negative result does not preclude SARS-Cov-2 infection and should not be used as the sole basis for treatment or other patient management decisions. A negative result may occur with  improper specimen collection/handling, submission of specimen other than nasopharyngeal swab, presence of viral mutation(s) within the areas targeted by this assay, and inadequate number of viral copies (<131 copies/mL). A negative result must be combined with clinical observations, patient history, and epidemiological information. The expected result is Negative.  Fact Sheet for Patients:  https://www.moore.com/  Fact Sheet for Healthcare Providers:  https://www.young.biz/  This test is no t yet approved or cleared by the [place] FDA and  has been authorized for detection and/or diagnosis of SARS-CoV-2 by FDA under an Emergency Use Authorization (EUA). This EUA will remain  in effect (meaning this test can be used) for the duration of the COVID-19 declaration under Section 564(b)(1) of the Act, 21 U.S.C. section 360bbb-3(b)(1), unless the authorization is terminated or revoked sooner.     Influenza A by PCR NEGATIVE NEGATIVE   Influenza B by PCR NEGATIVE NEGATIVE    Comment: (NOTE) The Xpert Xpress SARS-CoV-2/FLU/RSV assay is intended as an aid in  the diagnosis of influenza from Nasopharyngeal swab specimens and  should not be used as a sole basis for treatment. Nasal washings and  aspirates are unacceptable for Xpert Xpress SARS-CoV-2/FLU/RSV   testing.  Fact Sheet for Patients: https://www.moore.com/  Fact Sheet for Healthcare Providers: https://www.young.biz/  This test is not yet approved or cleared by the [place] FDA and  has been authorized for detection and/or diagnosis of SARS-CoV-2 by  FDA under an Emergency Use Authorization (EUA). This EUA will remain  in effect (meaning this test can be used) for the duration of the  Covid-19 declaration under Section 564(b)(1) of the Act, 21  U.S.C. section 360bbb-3(b)(1), unless the authorization is  terminated  or revoked. Performed at [facility], [address], [place], [place] [address]   Prepare RBC (crossmatch)     Status: None   Collection Time: 08/22/20 12:30 PM  Result Value Ref Range   Order Confirmation      ORDER PROCESSED BY BLOOD BANK Performed at [facility], [address], [place], [place] [address]   ABO/Rh     Status: None   Collection Time: 08/22/20 12:43 PM  Result Value Ref Range   ABO/RH(D)      A POS Performed at [facility], [address], [place], [place] [address]   Iron and TIBC     Status: Abnormal   Collection Time: 08/22/20  3:53 PM  Result Value Ref Range   Iron 65 28 - 170 ug/dL   TIBC 213 (L) 086 - 578 ug/dL   Saturation Ratios 28 10.4 - 31.8 %   UIBC 171 ug/dL    Comment: Performed at [facility], [address], [place], [place] [address]  Comprehensive metabolic panel     Status: Abnormal   Collection Time: 08/23/20  2:20 AM  Result Value Ref Range   Sodium 131 (L) 135 - 145 mmol/L   Potassium 3.7 3.5 - 5.1 mmol/L   Chloride 100 98 - 111 mmol/L   CO2 24 22 - 32 mmol/L   Glucose, Bld 106 (H) 70 - 99 mg/dL    Comment: Glucose reference range applies only to samples taken after fasting for at least 8 hours.   BUN 27 (H) 8 - 23 mg/dL   Creatinine, Ser 9.52 0.44 - 1.00 mg/dL   Calcium 8.0 (L) 8.9 - 10.3  mg/dL   Total Protein 5.1 (L) 6.5 - 8.1 g/dL   Albumin 2.5 (L) 3.5 - 5.0 g/dL   AST 14 (L) 15 - 41 U/L   ALT 14 0 - 44 U/L   Alkaline Phosphatase 76 38 - 126 U/L   Total Bilirubin 0.9 0.3 - 1.2 mg/dL   GFR calc non Af Amer >60 >60 mL/min   GFR calc Af Amer >60 >60 mL/min   Anion gap 7 5 - 15    Comment: Performed at [facility], [address], [place], [place] [address]  CBC     Status: Abnormal   Collection Time: 08/23/20  2:20 AM  Result Value Ref Range   WBC 10.6 (H) 4.0 - 10.5 K/uL   RBC 2.88 (L) 3.87 - 5.11 MIL/uL   Hemoglobin 9.1 (L) 12.0 - 15.0 g/dL    Comment: REPEATED TO VERIFY POST TRANSFUSION SPECIMEN    HCT 25.4 (L) 36 - 46 %   MCV 88.2 80.0 - 100.0 fL   MCH 31.6 26.0 - 34.0 pg   MCHC 35.8 30.0 - 36.0 g/dL   RDW 44.0 10.2 - 72.5 %   Platelets 325 150 - 400 K/uL   nRBC 0.0 0.0 - 0.2 %    Comment: Performed at [facility], [address] [place], [place] [address]  Hemoglobin and hematocrit, blood     Status: Abnormal   Collection Time: 08/23/20 10:27 AM  Result Value Ref Range   Hemoglobin 8.4 (L) 12.0 - 15.0 g/dL   HCT 03.4 (L) 36 - 46 %    Comment: Performed at [facility], [address] [place], [place] [address]  Urinalysis, Routine w reflex microscopic Urine, Random     Status: Abnormal   Collection  Time: 08/23/20 11:07 AM  Result Value Ref Range   Color, Urine YELLOW YELLOW   APPearance HAZY (A) CLEAR   Specific Gravity, Urine 1.023 1.005 - 1.030   pH 5.0 5.0 - 8.0   Glucose, UA NEGATIVE NEGATIVE mg/dL   Hgb urine dipstick LARGE (A) NEGATIVE   Bilirubin Urine NEGATIVE NEGATIVE   Ketones, ur 5 (A) NEGATIVE mg/dL   Protein, ur NEGATIVE NEGATIVE mg/dL   Nitrite NEGATIVE NEGATIVE   Leukocytes,Ua NEGATIVE NEGATIVE   RBC / HPF 6-10 0 - 5 RBC/hpf   WBC, UA 6-10 0 - 5 WBC/hpf   Bacteria, UA RARE (A) NONE SEEN   Squamous Epithelial / LPF 0-5 0 - 5   Mucus PRESENT    Ca Oxalate Crys, UA PRESENT      Comment: Performed at [facility], [address], [place], [place] [address]  CBC     Status: Abnormal   Collection Time: 08/23/20  5:05 PM  Result Value Ref Range   WBC 12.6 (H) 4.0 - 10.5 K/uL   RBC 2.11 (L) 3.87 - 5.11 MIL/uL   Hemoglobin 6.6 (LL) 12.0 - 15.0 g/dL    Comment: REPEATED TO VERIFY THIS CRITICAL RESULT HAS VERIFIED AND BEEN CALLED TO N.ARAYA BY NATHAN THOMPSON ON 09 27 2021 AT 1738, AND HAS BEEN READ BACK. CRITICAL RESULT VERIFIED    HCT 19.4 (L) 36 - 46 %   MCV 91.9 80.0 - 100.0 fL   MCH 31.3 26.0 - 34.0 pg   MCHC 34.0 30.0 - 36.0 g/dL   RDW 09.6 28.3 - 66.2 %   Platelets 292 150 - 400 K/uL   nRBC 0.0 0.0 - 0.2 %    Comment: Performed at [facility], [address], [place], [place] [address]  TSH     Status: None   Collection Time: 08/23/20  5:05 PM  Result Value Ref Range   TSH 0.628 0.350 - 4.500 uIU/mL    Comment: Performed by a 3rd Generation assay with a functional sensitivity of <=0.01 uIU/mL. Performed at [facility], [address] [place], [place] [address]   Ferritin     Status: None   Collection Time: 08/23/20  5:05 PM  Result Value Ref Range   Ferritin 60 11 - 307 ng/mL    Comment: Performed at [facility], [address], [place], [place] [address]  Prepare RBC (crossmatch)     Status: None   Collection Time: 08/23/20  5:52 PM  Result Value Ref Range   Order Confirmation      ORDER PROCESSED BY BLOOD BANK Performed at [facility], [address], [place], [place] [address]   Basic metabolic panel     Status: Abnormal   Collection Time: 08/24/20  4:37 AM  Result Value Ref Range   Sodium 132 (L) 135 - 145 mmol/L   Potassium 3.6 3.5 - 5.1 mmol/L   Chloride 100 98 - 111 mmol/L   CO2 26 22 - 32 mmol/L   Glucose, Bld 120 (H) 70 - 99 mg/dL    Comment: Glucose reference range applies only to samples taken after fasting for at least 8 hours.    BUN 32 (H) 8 - 23 mg/dL   Creatinine, Ser 1.70 0.44 - 1.00 mg/dL   Calcium 7.8 (L) 8.9 - 10.3 mg/dL   GFR calc non Af Amer >60 >60 mL/min   GFR calc Af Amer >60 >60 mL/min   Anion  gap 6 5 - 15    Comment: Performed at [facility], [address] [place], [place] [address]  CBC with Differential/Platelet     Status: Abnormal   Collection Time: 08/24/20  4:37 AM  Result Value Ref Range   WBC 13.8 (H) 4.0 - 10.5 K/uL   RBC 2.90 (L) 3.87 - 5.11 MIL/uL   Hemoglobin 9.4 (L) 12.0 - 15.0 g/dL    Comment: REPEATED TO VERIFY   HCT 26.5 (L) 36 - 46 %   MCV 91.4 80.0 - 100.0 fL   MCH 32.4 26.0 - 34.0 pg   MCHC 35.5 30.0 - 36.0 g/dL   RDW 60.4 54.0 - 98.1 %   Platelets 214 150 - 400 K/uL   nRBC 0.2 0.0 - 0.2 %   Neutrophils Relative % 73 %   Neutro Abs 10.1 (H) 1.7 - 7.7 K/uL   Lymphocytes Relative 15 %   Lymphs Abs 2.1 0.7 - 4.0 K/uL   Monocytes Relative 8 %   Monocytes Absolute 1.1 (H) 0 - 1 K/uL   Eosinophils Relative 2 %   Eosinophils Absolute 0.3 0 - 0 K/uL   Basophils Relative 0 %   Basophils Absolute 0.0 0 - 0 K/uL   Immature Granulocytes 2 %   Abs Immature Granulocytes 0.21 (H) 0.00 - 0.07 K/uL    Comment: Performed at [facility], [address], [place], [place] [address]  Protime-INR     Status: None   Collection Time: 08/24/20  4:37 AM  Result Value Ref Range   Prothrombin Time 14.4 11.4 - 15.2 seconds   INR 1.2 0.8 - 1.2    Comment: (NOTE) INR goal varies based on device and disease states. Performed at [facility], [address], [place], [place] [address]     Studies/Results: DG Chest 2 View  Result Date: 08/22/2020 CLINICAL DATA:  Syncope. EXAM: CHEST - 2 VIEW COMPARISON:  None. FINDINGS: Cardiomediastinal silhouette is normal. Mediastinal contours appear intact. There is no evidence of focal airspace consolidation, pleural effusion or pneumothorax. Osseous structures are without acute abnormality. Soft  tissues are grossly normal. IMPRESSION: No active cardiopulmonary disease. Electronically Signed   By: Ted Mcalpine M.D.   On: 08/22/2020 11:52   CT Head Wo Contrast  Result Date: 08/22/2020 CLINICAL DATA:  Recent syncopal episode EXAM: CT HEAD WITHOUT CONTRAST TECHNIQUE: Contiguous axial images were obtained from the base of the skull through the vertex without intravenous contrast. COMPARISON:  None. FINDINGS: Brain: Mild atrophic changes and chronic white matter ischemic changes are noted. No findings to suggest acute hemorrhage, acute infarction or space-occupying mass lesion are noted. Vascular: No hyperdense vessel or unexpected calcification. Skull: Normal. Negative for fracture or focal lesion. Sinuses/Orbits: No acute finding. Other: None. IMPRESSION: Chronic atrophic and ischemic changes without acute abnormality. Electronically Signed   By: Alcide Clever M.D.   On: 08/22/2020 12:05    Medications: I have reviewed the patient's current medications.  Assessment: Melena related to large cratered duodenal bulb/first portion of the duodenum ulcer with pigmentation and visible vessel, not amenable for endoscopic therapy Her hemoglobin dropped yesterday from 8.4-6.6, posttransfusion it is 9.4 today She is not hypotensive or tachycardic  Plan: Discussed with Dr. Loreta Ave from IR yesterday Patient will benefit from IR guided embolization of GDA as the cratered duodenal ulcer with visible vessel and pigmentation is at very high risk of bleeding. Patient is n.p.o. post midnight for possible procedure today with IR  Continue IV Protonix drip Monitor H&H and transfuse to keep hemoglobin above 7  Kerin Salen, MD 08/24/2020, 8:44 AM

## 2020-08-24 NOTE — Progress Notes (Signed)
Critical Hgb 6.2. Paged Dr. Katherina Right.

## 2020-08-24 NOTE — Telephone Encounter (Signed)
Pts. Andrey Campanile called stating she can not make it in tomorrow for her apt. She is back in the hospital for bleeding ulcers she wasn't doing to good so they took her to [facility].

## 2020-08-24 NOTE — Hospital Course (Addendum)
Elizabeth Haynes is a 74 year old female with PMH of osteoporosis, osteopenia, recent rehab stay after fall with back injury who presented to the ED with complaints of progressive generalized weakness, decreased appetite, abdominal pain and syncopal episodes.    Patient's neighbor reported large, dark stool the day prior to admission and EDP noted maroon-colored stool on rectal exam which was guaiac positive.  Hemoglobin 5.6 on admission.  Admitted for symptomatic anemia due to suspected acute/subacute upper GI bleeding, acute blood loss anemia and syncope.  S/p 2 units PRBC transfusion on admission.  [facility] consulted and EGD on 9/27 revealed a large cratered duodenal ulcer, pigmentation and visible vessel, not amenable to endoscopic therapy. She then underwent coil embolization of the GDA with IR on 9/28.  She continued to be monitored in the hospital with hemoglobin trending. Ultimately her hemoglobin levels stabilized and stools transitioned from red/maroon to dark, consistent with resolving underlying bleed. Surgery had also been consulted after embolization due to drop in hemoglobin, but patient stabilized as noted and did not require any surgical intervention. She was considered stable for discharging home with home health physical therapy.

## 2020-08-24 NOTE — Progress Notes (Signed)
PROGRESS NOTE    Elizabeth Haynes   VQQ:[id number]  DOB: 18-Mar-1946  DOA: 08/22/2020     2  PCP: Joselyn Arrow, MD  CC: bloody stools, weakness   Hospital Course: Elizabeth Haynes is a 74 year old female with PMH of osteoporosis, osteopenia, recent rehab stay after fall with back injury who presented to the ED with complaints of progressive generalized weakness, decreased appetite, abdominal pain and syncopal episodes.    Patient's neighbor reported large, dark stool the day prior to admission and EDP noted maroon-colored stool on rectal exam which was guaiac positive.  Hemoglobin 5.6 on admission.  Admitted for symptomatic anemia due to suspected acute/subacute upper GI bleeding, acute blood loss anemia and syncope.  S/p 2 units PRBC transfusion on admission.  [facility] consulted and EGD on 9/27 revealed a large cratered duodenal ulcer, pigmentation and visible vessel, not amenable to endoscopic therapy. She then underwent coil embolization of the GDA with IR on 9/28.    Interval History:  Seen in her room after IR embolization.  Her friend is bedside which helps care for her at home.  They stated that they will have 24/7 care arranged for after hospital discharge and are hoping for the patient to return home instead of back to rehab.  Patient was still having some bloody stools this morning prior to going down for embolization.  Old records reviewed in assessment of this patient  ROS: Constitutional: negative for chills and fevers, Respiratory: negative for cough, Cardiovascular: negative for chest pain and Gastrointestinal: negative for abdominal pain  Assessment & Plan: Symptomatic anemia secondary to acute GI blood loss: Hemoglobin 10.6 on 08/03/2020.  Presented with hemoglobin of 5.6.  S/p 2 units PRBC appropriately improved to 9.1 > 8.4.  - continue trending and will transfuse further as needed  UGIB 2/2 duodenal ulcer - as seen on EGD on 9/27; ulcer not amenable for treatment during  EGD -Patient underwent coil embolization with IR on 08/24/2020 -Continue PPI -Continue trending hemoglobin -Outpatient follow-up with GI after discharge  Syncope: Suspect due to symptomatic anemia.  Telemetry shows sinus rhythm and 8 beat NS SVT.  Continue telemetry monitoring.  Will check 2D echo.  Also check orthostatic vital signs.  Patient needs to be advised that she should not drive for 6 months, needs to be done prior to discharge.  CT head with chronic changes and no acute abnormalities. TSH normal.  Acute lower UTI: Treated with Ceftin as outpatient without relief of symptoms.  Symptoms then started to improve with Cipro.  Completing 3-day course.  Dehydration with hyponatremia: Serum sodium 128 on admission.  Improving on IV fluids.  Continue gentle IV fluids for additional 24 hours.  Falls: Related to weakness from symptomatic anemia and syncope.  PT recommends home health PT.  Body mass index is 17.38 kg/m.  Antimicrobials: Cipro 08/22/2020>> 08/23/2020  DVT prophylaxis: SCD Code Status: DNR Family Communication: Caretaker bedside Disposition Plan: Status is: Inpatient  Remains inpatient appropriate because:Unsafe d/c plan, IV treatments appropriate due to intensity of illness or inability to take PO and Inpatient level of care appropriate due to severity of illness   Dispo: The patient is from: Home              Anticipated d/c is to: Home              Anticipated d/c date is: 2 days              Patient currently is not medically stable  to d/c.       Objective: Blood pressure 107/61, pulse 85, temperature 98.3 F (36.8 C), temperature source Oral, resp. rate 18, height  (1.575 m), weight 43.1 kg, SpO2 97 %.  Examination: General appearance: alert, cooperative and no distress Head: Normocephalic, without obvious abnormality Eyes: EOMI Lungs: clear to auscultation bilaterally Heart: regular rate and rhythm and S1, S2 normal Abdomen: normal  findings: bowel sounds normal and soft, non-tender Extremities: No edema Skin: mobility and turgor normal Neurologic: Grossly normal  Consultants:   GI  IR  Procedures:   EGD 08/23/2020  IR coil embolization of GDA, 08/24/2020  Data Reviewed: I have personally reviewed following labs and imaging studies Results for orders placed or performed during the hospital encounter of 08/22/20 (from the past 24 hour(s))  CBC     Status: Abnormal   Collection Time: 08/23/20  5:05 PM  Result Value Ref Range   WBC 12.6 (H) 4.0 - 10.5 K/uL   RBC 2.11 (L) 3.87 - 5.11 MIL/uL   Hemoglobin 6.6 (LL) 12.0 - 15.0 g/dL   HCT 40.9 (L) 36 - 46 %   MCV 91.9 80.0 - 100.0 fL   MCH 31.3 26.0 - 34.0 pg   MCHC 34.0 30.0 - 36.0 g/dL   RDW 81.1 91.4 - 78.2 %   Platelets 292 150 - 400 K/uL   nRBC 0.0 0.0 - 0.2 %  TSH     Status: None   Collection Time: 08/23/20  5:05 PM  Result Value Ref Range   TSH 0.628 0.350 - 4.500 uIU/mL  Ferritin     Status: None   Collection Time: 08/23/20  5:05 PM  Result Value Ref Range   Ferritin 60 11 - 307 ng/mL  Prepare RBC (crossmatch)     Status: None   Collection Time: 08/23/20  5:52 PM  Result Value Ref Range   Order Confirmation      ORDER PROCESSED BY BLOOD BANK Performed at [facility], [address], [place], [place] [address]   Basic metabolic panel     Status: Abnormal   Collection Time: 08/24/20  4:37 AM  Result Value Ref Range   Sodium 132 (L) 135 - 145 mmol/L   Potassium 3.6 3.5 - 5.1 mmol/L   Chloride 100 98 - 111 mmol/L   CO2 26 22 - 32 mmol/L   Glucose, Bld 120 (H) 70 - 99 mg/dL   BUN 32 (H) 8 - 23 mg/dL   Creatinine, Ser 3.08 0.44 - 1.00 mg/dL   Calcium 7.8 (L) 8.9 - 10.3 mg/dL   GFR calc non Af Amer >60 >60 mL/min   GFR calc Af Amer >60 >60 mL/min   Anion gap 6 5 - 15  CBC with Differential/Platelet     Status: Abnormal   Collection Time: 08/24/20  4:37 AM  Result Value Ref Range   WBC 13.8 (H) 4.0 - 10.5 K/uL   RBC  2.90 (L) 3.87 - 5.11 MIL/uL   Hemoglobin 9.4 (L) 12.0 - 15.0 g/dL   HCT 65.7 (L) 36 - 46 %   MCV 91.4 80.0 - 100.0 fL   MCH 32.4 26.0 - 34.0 pg   MCHC 35.5 30.0 - 36.0 g/dL   RDW 84.6 96.2 - 95.2 %   Platelets 214 150 - 400 K/uL   nRBC 0.2 0.0 - 0.2 %   Neutrophils Relative % 73 %   Neutro Abs 10.1 (H) 1.7 - 7.7 K/uL   Lymphocytes Relative 15 %  Lymphs Abs 2.1 0.7 - 4.0 K/uL   Monocytes Relative 8 %   Monocytes Absolute 1.1 (H) 0 - 1 K/uL   Eosinophils Relative 2 %   Eosinophils Absolute 0.3 0 - 0 K/uL   Basophils Relative 0 %   Basophils Absolute 0.0 0 - 0 K/uL   Immature Granulocytes 2 %   Abs Immature Granulocytes 0.21 (H) 0.00 - 0.07 K/uL  Protime-INR     Status: None   Collection Time: 08/24/20  4:37 AM  Result Value Ref Range   Prothrombin Time 14.4 11.4 - 15.2 seconds   INR 1.2 0.8 - 1.2  CBC     Status: Abnormal   Collection Time: 08/24/20  2:12 PM  Result Value Ref Range   WBC 13.4 (H) 4.0 - 10.5 K/uL   RBC 2.28 (L) 3.87 - 5.11 MIL/uL   Hemoglobin 7.5 (L) 12.0 - 15.0 g/dL   HCT 81.8 (L) 36 - 46 %   MCV 93.4 80.0 - 100.0 fL   MCH 32.9 26.0 - 34.0 pg   MCHC 35.2 30.0 - 36.0 g/dL   RDW 56.3 14.9 - 70.2 %   Platelets 213 150 - 400 K/uL   nRBC 0.2 0.0 - 0.2 %    Recent Results (from the past 240 hour(s))  Respiratory Panel by RT PCR (Flu A&B, Covid) - Nasopharyngeal Swab     Status: None   Collection Time: 08/22/20 12:23 PM   Specimen: Nasopharyngeal Swab  Result Value Ref Range Status   SARS Coronavirus 2 by RT PCR NEGATIVE NEGATIVE Final    Comment: (NOTE) SARS-CoV-2 target nucleic acids are NOT DETECTED.  The SARS-CoV-2 RNA is generally detectable in upper respiratoy specimens during the acute phase of infection. The lowest concentration of SARS-CoV-2 viral copies this assay can detect is 131 copies/mL. A negative result does not preclude SARS-Cov-2 infection and should not be used as the sole basis for treatment or other patient management decisions. A  negative result may occur with  improper specimen collection/handling, submission of specimen other than nasopharyngeal swab, presence of viral mutation(s) within the areas targeted by this assay, and inadequate number of viral copies (<131 copies/mL). A negative result must be combined with clinical observations, patient history, and epidemiological information. The expected result is Negative.  Fact Sheet for Patients:  https://www.moore.com/  Fact Sheet for Healthcare Providers:  https://www.young.biz/  This test is no t yet approved or cleared by the [place] FDA and  has been authorized for detection and/or diagnosis of SARS-CoV-2 by FDA under an Emergency Use Authorization (EUA). This EUA will remain  in effect (meaning this test can be used) for the duration of the COVID-19 declaration under Section 564(b)(1) of the Act, 21 U.S.C. section 360bbb-3(b)(1), unless the authorization is terminated or revoked sooner.     Influenza A by PCR NEGATIVE NEGATIVE Final   Influenza B by PCR NEGATIVE NEGATIVE Final    Comment: (NOTE) The Xpert Xpress SARS-CoV-2/FLU/RSV assay is intended as an aid in  the diagnosis of influenza from Nasopharyngeal swab specimens and  should not be used as a sole basis for treatment. Nasal washings and  aspirates are unacceptable for Xpert Xpress SARS-CoV-2/FLU/RSV  testing.  Fact Sheet for Patients: https://www.moore.com/  Fact Sheet for Healthcare Providers: https://www.young.biz/  This test is not yet approved or cleared by the [place] FDA and  has been authorized for detection and/or diagnosis of SARS-CoV-2 by  FDA under an Emergency Use Authorization (EUA). This EUA will remain  in effect (meaning this test can be used) for the duration of the  Covid-19 declaration under Section 564(b)(1) of the Act, 21  U.S.C. section 360bbb-3(b)(1), unless the authorization  is  terminated or revoked. Performed at [facility], [address], [place], [place] [address]   Urine culture     Status: Abnormal   Collection Time: 08/23/20 11:08 AM   Specimen: Urine, Random  Result Value Ref Range Status   Specimen Description   Final    URINE, RANDOM Performed at [facility], [address], [place], [place] [address]    Special Requests   Final    NONE Performed at [facility], [address], [place], [place] [address]    Culture (A)  Final    <10,000 COLONIES/mL INSIGNIFICANT GROWTH Performed at [facility], [address], [place], [place] [address]    Report Status 08/24/2020 FINAL  Final     Radiology Studies: IR Angiogram Visceral Selective  Result Date: 08/24/2020 INDICATION: 74 year old female with significant upper GI bleeding from a large duodenal ulcer with visible vessel in the ulcer bed. She continued to have bleeding overnight with decreased hemoglobin this morning. She presents for visceral selective ultrasound and embolization. EXAM: SELECTIVE VISCERAL ARTERIOGRAPHY; IR ULTRASOUND GUIDANCE VASC ACCESS RIGHT; IR EMBO ART VEN HEMORR LYMPH EXTRAV INC GUIDE ROADMAPPING; ADDITIONAL ARTERIOGRAPHY MEDICATIONS: None ANESTHESIA/SEDATION: Moderate (conscious) sedation was employed during this procedure. A total of Versed 2 mg and Fentanyl 45 mcg was administered intravenously. Moderate Sedation Time: 40 minutes. The patient's level of consciousness and vital signs were monitored continuously by radiology nursing throughout the procedure under my direct supervision. CONTRAST:  45mL OMNIPAQUE IOHEXOL 300 MG/ML  SOLN FLUOROSCOPY TIME:  Fluoroscopy Time: 10 minutes 54 seconds (240 mGy). COMPLICATIONS: None immediate. PROCEDURE: Informed consent was obtained from the patient following explanation of the procedure, risks, benefits and alternatives. The patient understands, agrees and consents for  the procedure. All questions were addressed. A time out was performed prior to the initiation of the procedure. Maximal barrier sterile technique utilized including caps, mask, sterile gowns, sterile gloves, large sterile drape, hand hygiene, and Betadine prep. The right common femoral artery was interrogated with ultrasound and found to be widely patent. An image was obtained and stored for the medical record. Local anesthesia was attained by infiltration with 1% lidocaine. A small dermatotomy was made. Under real-time sonographic guidance, the vessel was punctured with a 21 gauge micropuncture needle. Using standard technique, the initial micro needle was exchanged over a 0.018 micro wire for a transitional 4 [place] micro sheath. The micro sheath was then exchanged over a 0.035 wire for a 5 French vascular sheath. A C2 cobra catheter was advanced over a Bentson wire into the abdominal aorta. The catheter was used to select the superior mesenteric artery. A superior mesenteric arteriogram was performed. No evidence of replaced right hepatic or enlarged pancreaticoduodenal arteries. Next, the C2 catheter was used to select the celiac axis. A celiac arteriogram was performed. Conventional hepatic arterial anatomy. There is a focal region of narrowing in the mid aspect of the gastroduodenal artery concerning for a site of vascular injury. Due to the down sloping configuration of the celiac axis, the C2 cobra catheter was exchanged for a Sos Omni selective catheter which was successfully engaged into the celiac artery. A renegade STC microcatheter was then advanced over a Fathom 16 wire into the gastroduodenal artery. Arteriography was performed. This confirms a focal narrowing  in the mid gastroduodenal artery. Coil embolization was then performed using a combination of interlock and Ruby detachable microcoils. The mid and distal gastroduodenal artery were successfully coiled to stasis. Contrast injection into the  gastroduodenal stump demonstrates reflux back into the hepatic artery. There is no forward flow. The catheter system was removed. Hemostasis was attained using a Cordis extra arterial vascular plug. IMPRESSION: 1. Focal region of narrowing in the mid aspect of the gastroduodenal artery concerning for region of spasm secondary to irritation from the overlying ulcer bed. This may represent the source of intermittent bleeding. 2. Successful coil embolization of the gastroduodenal artery. Signed, Sterling Big, MD, RPVI [facility] Electronically Signed   By: Malachy Moan M.D.   On: 08/24/2020 15:45   IR Angiogram Visceral Selective  Result Date: 08/24/2020 INDICATION: 74 year old female with significant upper GI bleeding from a large duodenal ulcer with visible vessel in the ulcer bed. She continued to have bleeding overnight with decreased hemoglobin this morning. She presents for visceral selective ultrasound and embolization. EXAM: SELECTIVE VISCERAL ARTERIOGRAPHY; IR ULTRASOUND GUIDANCE VASC ACCESS RIGHT; IR EMBO ART VEN HEMORR LYMPH EXTRAV INC GUIDE ROADMAPPING; ADDITIONAL ARTERIOGRAPHY MEDICATIONS: None ANESTHESIA/SEDATION: Moderate (conscious) sedation was employed during this procedure. A total of Versed 2 mg and Fentanyl 45 mcg was administered intravenously. Moderate Sedation Time: 40 minutes. The patient's level of consciousness and vital signs were monitored continuously by radiology nursing throughout the procedure under my direct supervision. CONTRAST:  45mL OMNIPAQUE IOHEXOL 300 MG/ML  SOLN FLUOROSCOPY TIME:  Fluoroscopy Time: 10 minutes 54 seconds (240 mGy). COMPLICATIONS: None immediate. PROCEDURE: Informed consent was obtained from the patient following explanation of the procedure, risks, benefits and alternatives. The patient understands, agrees and consents for the procedure. All questions were addressed. A time out was  performed prior to the initiation of the procedure. Maximal barrier sterile technique utilized including caps, mask, sterile gowns, sterile gloves, large sterile drape, hand hygiene, and Betadine prep. The right common femoral artery was interrogated with ultrasound and found to be widely patent. An image was obtained and stored for the medical record. Local anesthesia was attained by infiltration with 1% lidocaine. A small dermatotomy was made. Under real-time sonographic guidance, the vessel was punctured with a 21 gauge micropuncture needle. Using standard technique, the initial micro needle was exchanged over a 0.018 micro wire for a transitional 4 [place] micro sheath. The micro sheath was then exchanged over a 0.035 wire for a 5 French vascular sheath. A C2 cobra catheter was advanced over a Bentson wire into the abdominal aorta. The catheter was used to select the superior mesenteric artery. A superior mesenteric arteriogram was performed. No evidence of replaced right hepatic or enlarged pancreaticoduodenal arteries. Next, the C2 catheter was used to select the celiac axis. A celiac arteriogram was performed. Conventional hepatic arterial anatomy. There is a focal region of narrowing in the mid aspect of the gastroduodenal artery concerning for a site of vascular injury. Due to the down sloping configuration of the celiac axis, the C2 cobra catheter was exchanged for a Sos Omni selective catheter which was successfully engaged into the celiac artery. A renegade STC microcatheter was then advanced over a Fathom 16 wire into the gastroduodenal artery. Arteriography was performed. This confirms a focal narrowing in the mid gastroduodenal artery. Coil embolization was then performed using a combination of interlock and Ruby detachable microcoils. The mid and distal gastroduodenal artery were successfully coiled to stasis. Contrast injection into  the gastroduodenal stump demonstrates reflux back into the hepatic  artery. There is no forward flow. The catheter system was removed. Hemostasis was attained using a Cordis extra arterial vascular plug. IMPRESSION: 1. Focal region of narrowing in the mid aspect of the gastroduodenal artery concerning for region of spasm secondary to irritation from the overlying ulcer bed. This may represent the source of intermittent bleeding. 2. Successful coil embolization of the gastroduodenal artery. Signed, Sterling BigHeath K. McCullough, MD, RPVI [facility] Electronically Signed   By: Malachy MoanHeath  McCullough M.D.   On: 08/24/2020 15:45   IR Angiogram Selective Each Additional Vessel  Result Date: 08/24/2020 INDICATION: 74 year old female with significant upper GI bleeding from a large duodenal ulcer with visible vessel in the ulcer bed. She continued to have bleeding overnight with decreased hemoglobin this morning. She presents for visceral selective ultrasound and embolization. EXAM: SELECTIVE VISCERAL ARTERIOGRAPHY; IR ULTRASOUND GUIDANCE VASC ACCESS RIGHT; IR EMBO ART VEN HEMORR LYMPH EXTRAV INC GUIDE ROADMAPPING; ADDITIONAL ARTERIOGRAPHY MEDICATIONS: None ANESTHESIA/SEDATION: Moderate (conscious) sedation was employed during this procedure. A total of Versed 2 mg and Fentanyl 45 mcg was administered intravenously. Moderate Sedation Time: 40 minutes. The patient's level of consciousness and vital signs were monitored continuously by radiology nursing throughout the procedure under my direct supervision. CONTRAST:  45mL OMNIPAQUE IOHEXOL 300 MG/ML  SOLN FLUOROSCOPY TIME:  Fluoroscopy Time: 10 minutes 54 seconds (240 mGy). COMPLICATIONS: None immediate. PROCEDURE: Informed consent was obtained from the patient following explanation of the procedure, risks, benefits and alternatives. The patient understands, agrees and consents for the procedure. All questions were addressed. A time out was performed prior to the initiation of the procedure.  Maximal barrier sterile technique utilized including caps, mask, sterile gowns, sterile gloves, large sterile drape, hand hygiene, and Betadine prep. The right common femoral artery was interrogated with ultrasound and found to be widely patent. An image was obtained and stored for the medical record. Local anesthesia was attained by infiltration with 1% lidocaine. A small dermatotomy was made. Under real-time sonographic guidance, the vessel was punctured with a 21 gauge micropuncture needle. Using standard technique, the initial micro needle was exchanged over a 0.018 micro wire for a transitional 4 [place] micro sheath. The micro sheath was then exchanged over a 0.035 wire for a 5 French vascular sheath. A C2 cobra catheter was advanced over a Bentson wire into the abdominal aorta. The catheter was used to select the superior mesenteric artery. A superior mesenteric arteriogram was performed. No evidence of replaced right hepatic or enlarged pancreaticoduodenal arteries. Next, the C2 catheter was used to select the celiac axis. A celiac arteriogram was performed. Conventional hepatic arterial anatomy. There is a focal region of narrowing in the mid aspect of the gastroduodenal artery concerning for a site of vascular injury. Due to the down sloping configuration of the celiac axis, the C2 cobra catheter was exchanged for a Sos Omni selective catheter which was successfully engaged into the celiac artery. A renegade STC microcatheter was then advanced over a Fathom 16 wire into the gastroduodenal artery. Arteriography was performed. This confirms a focal narrowing in the mid gastroduodenal artery. Coil embolization was then performed using a combination of interlock and Ruby detachable microcoils. The mid and distal gastroduodenal artery were successfully coiled to stasis. Contrast injection into the gastroduodenal stump demonstrates reflux back into the hepatic artery. There is no forward flow. The catheter system  was removed. Hemostasis was attained using a Cordis extra arterial vascular plug.  IMPRESSION: 1. Focal region of narrowing in the mid aspect of the gastroduodenal artery concerning for region of spasm secondary to irritation from the overlying ulcer bed. This may represent the source of intermittent bleeding. 2. Successful coil embolization of the gastroduodenal artery. Signed, Sterling Big, MD, RPVI [facility] Radiology Electronically Signed   By: Malachy Moan M.D.   On: 08/24/2020 15:45   IR US Guide Vasc Access Right  Result Date: 08/24/2020 INDICATION: 74 year old female with significant upper GI bleeding from a large duodenal ulcer with visible vessel in the ulcer bed. She continued to have bleeding overnight with decreased hemoglobin this morning. She presents for visceral selective ultrasound and embolization. EXAM: SELECTIVE VISCERAL ARTERIOGRAPHY; IR ULTRASOUND GUIDANCE VASC ACCESS RIGHT; IR EMBO ART VEN HEMORR LYMPH EXTRAV INC GUIDE ROADMAPPING; ADDITIONAL ARTERIOGRAPHY MEDICATIONS: None ANESTHESIA/SEDATION: Moderate (conscious) sedation was employed during this procedure. A total of Versed 2 mg and Fentanyl 45 mcg was administered intravenously. Moderate Sedation Time: 40 minutes. The patient's level of consciousness and vital signs were monitored continuously by radiology nursing throughout the procedure under my direct supervision. CONTRAST:  42mL OMNIPAQUE IOHEXOL 300 MG/ML  SOLN FLUOROSCOPY TIME:  Fluoroscopy Time: 10 minutes 54 seconds (240 mGy). COMPLICATIONS: None immediate. PROCEDURE: Informed consent was obtained from the patient following explanation of the procedure, risks, benefits and alternatives. The patient understands, agrees and consents for the procedure. All questions were addressed. A time out was performed prior to the initiation of the procedure. Maximal barrier sterile technique utilized including caps, mask, sterile  gowns, sterile gloves, large sterile drape, hand hygiene, and Betadine prep. The right common femoral artery was interrogated with ultrasound and found to be widely patent. An image was obtained and stored for the medical record. Local anesthesia was attained by infiltration with 1% lidocaine. A small dermatotomy was made. Under real-time sonographic guidance, the vessel was punctured with a 21 gauge micropuncture needle. Using standard technique, the initial micro needle was exchanged over a 0.018 micro wire for a transitional 4 [place] micro sheath. The micro sheath was then exchanged over a 0.035 wire for a 5 French vascular sheath. A C2 cobra catheter was advanced over a Bentson wire into the abdominal aorta. The catheter was used to select the superior mesenteric artery. A superior mesenteric arteriogram was performed. No evidence of replaced right hepatic or enlarged pancreaticoduodenal arteries. Next, the C2 catheter was used to select the celiac axis. A celiac arteriogram was performed. Conventional hepatic arterial anatomy. There is a focal region of narrowing in the mid aspect of the gastroduodenal artery concerning for a site of vascular injury. Due to the down sloping configuration of the celiac axis, the C2 cobra catheter was exchanged for a Sos Omni selective catheter which was successfully engaged into the celiac artery. A renegade STC microcatheter was then advanced over a Fathom 16 wire into the gastroduodenal artery. Arteriography was performed. This confirms a focal narrowing in the mid gastroduodenal artery. Coil embolization was then performed using a combination of interlock and Ruby detachable microcoils. The mid and distal gastroduodenal artery were successfully coiled to stasis. Contrast injection into the gastroduodenal stump demonstrates reflux back into the hepatic artery. There is no forward flow. The catheter system was removed. Hemostasis was attained using a Cordis extra arterial  vascular plug. IMPRESSION: 1. Focal region of narrowing in the mid aspect of the gastroduodenal artery concerning for region of spasm secondary to irritation from the overlying ulcer bed. This may represent the  source of intermittent bleeding. 2. Successful coil embolization of the gastroduodenal artery. Signed, Sterling Big, MD, RPVI [facility] Radiology Electronically Signed   By: Malachy Moan M.D.   On: 08/24/2020 15:45   IR EMBO ART  VEN HEMORR LYMPH EXTRAV  INC GUIDE ROADMAPPING  Result Date: 08/24/2020 INDICATION: 74 year old female with significant upper GI bleeding from a large duodenal ulcer with visible vessel in the ulcer bed. She continued to have bleeding overnight with decreased hemoglobin this morning. She presents for visceral selective ultrasound and embolization. EXAM: SELECTIVE VISCERAL ARTERIOGRAPHY; IR ULTRASOUND GUIDANCE VASC ACCESS RIGHT; IR EMBO ART VEN HEMORR LYMPH EXTRAV INC GUIDE ROADMAPPING; ADDITIONAL ARTERIOGRAPHY MEDICATIONS: None ANESTHESIA/SEDATION: Moderate (conscious) sedation was employed during this procedure. A total of Versed 2 mg and Fentanyl 45 mcg was administered intravenously. Moderate Sedation Time: 40 minutes. The patient's level of consciousness and vital signs were monitored continuously by radiology nursing throughout the procedure under my direct supervision. CONTRAST:  45mL OMNIPAQUE IOHEXOL 300 MG/ML  SOLN FLUOROSCOPY TIME:  Fluoroscopy Time: 10 minutes 54 seconds (240 mGy). COMPLICATIONS: None immediate. PROCEDURE: Informed consent was obtained from the patient following explanation of the procedure, risks, benefits and alternatives. The patient understands, agrees and consents for the procedure. All questions were addressed. A time out was performed prior to the initiation of the procedure. Maximal barrier sterile technique utilized including caps, mask, sterile gowns, sterile gloves, large sterile  drape, hand hygiene, and Betadine prep. The right common femoral artery was interrogated with ultrasound and found to be widely patent. An image was obtained and stored for the medical record. Local anesthesia was attained by infiltration with 1% lidocaine. A small dermatotomy was made. Under real-time sonographic guidance, the vessel was punctured with a 21 gauge micropuncture needle. Using standard technique, the initial micro needle was exchanged over a 0.018 micro wire for a transitional 4 [place] micro sheath. The micro sheath was then exchanged over a 0.035 wire for a 5 French vascular sheath. A C2 cobra catheter was advanced over a Bentson wire into the abdominal aorta. The catheter was used to select the superior mesenteric artery. A superior mesenteric arteriogram was performed. No evidence of replaced right hepatic or enlarged pancreaticoduodenal arteries. Next, the C2 catheter was used to select the celiac axis. A celiac arteriogram was performed. Conventional hepatic arterial anatomy. There is a focal region of narrowing in the mid aspect of the gastroduodenal artery concerning for a site of vascular injury. Due to the down sloping configuration of the celiac axis, the C2 cobra catheter was exchanged for a Sos Omni selective catheter which was successfully engaged into the celiac artery. A renegade STC microcatheter was then advanced over a Fathom 16 wire into the gastroduodenal artery. Arteriography was performed. This confirms a focal narrowing in the mid gastroduodenal artery. Coil embolization was then performed using a combination of interlock and Ruby detachable microcoils. The mid and distal gastroduodenal artery were successfully coiled to stasis. Contrast injection into the gastroduodenal stump demonstrates reflux back into the hepatic artery. There is no forward flow. The catheter system was removed. Hemostasis was attained using a Cordis extra arterial vascular plug. IMPRESSION: 1. Focal region  of narrowing in the mid aspect of the gastroduodenal artery concerning for region of spasm secondary to irritation from the overlying ulcer bed. This may represent the source of intermittent bleeding. 2. Successful coil embolization of the gastroduodenal artery. Signed, Sterling Big, MD, RPVI [facility] Radiology  Electronically Signed   By: Malachy Moan M.D.   On: 08/24/2020 15:45   IR Angiogram Visceral Selective  Final Result    IR US Guide Vasc Access Right  Final Result    IR Angiogram Selective Each Additional Vessel  Final Result    IR Angiogram Visceral Selective  Final Result    IR EMBO ART  VEN HEMORR LYMPH EXTRAV  INC GUIDE ROADMAPPING  Final Result    CT Head Wo Contrast  Final Result    DG Chest 2 View  Final Result      Scheduled Meds: . sodium chloride   Intravenous Once  . B-complex with vitamin C  1 tablet Oral Daily  . calcium carbonate  1,250 mg Oral BID WC  . cholecalciferol  1,000 Units Oral Daily  . ciprofloxacin  500 mg Oral BID  . fentaNYL      . lidocaine      . midazolam      . multivitamin with minerals  1 tablet Oral Daily  . [START ON 08/27/2020] pantoprazole  40 mg Intravenous Q12H  . vitamin E  800 Units Oral Daily   PRN Meds: acetaminophen **OR** acetaminophen, iohexol, olopatadine, ondansetron **OR** ondansetron (ZOFRAN) IV Continuous Infusions: . pantoprozole (PROTONIX) infusion 8 mg/hr (08/24/20 1628)      LOS: 2 days  Time spent: Greater than 50% of the 35 minute visit was spent in counseling/coordination of care for the patient as laid out in the A&P.   Lewie Chamber, MD [facility] 08/24/2020, 4:36 PM  Contact via secure chat.  To contact the attending provider between 7A-7P or the covering provider during after hours 7P-7A, please log into the web site [email or web address] and access using universal [place] password for that web site. If you do not have the  password, please call the hospital operator.

## 2020-08-24 NOTE — Progress Notes (Signed)
Physical Therapy Treatment Patient Details Name: Elizabeth Haynes MRN: [id number] DOB: 1945/12/19 Today's Date: 08/24/2020    History of Present Illness 74 yo female admitted with GI bleed. Hx of osteopenia, osteoporosis    PT Comments    Minimal activity this session since pt had procedure this a.m. Will check back on tomorrow for hallway ambulation.    Follow Up Recommendations  Home health PT;Home Health OT     Equipment Recommendations  None recommended by PT    Recommendations for Other Services       Precautions / Restrictions Precautions Precautions: Fall Precaution Comments: dizziness Restrictions Weight Bearing Restrictions: No    Mobility  Bed Mobility   Bed Mobility: Supine to Sit;Sit to Supine     Supine to sit: Supervision;HOB elevated Sit to supine: Supervision;HOB elevated      Transfers Overall transfer level: Needs assistance Equipment used: Rolling walker (2 wheeled) Transfers: Sit to/from Stand Sit to Stand: Min guard         General transfer comment: Min guard for safety. Mildly unsteady.  Ambulation/Gait             General Gait Details: Min guard for safety. Mildly unsteady. Some dizziness. Side steps along side of bed with RW only on today.   Stairs             Wheelchair Mobility    Modified Rankin (Stroke Patients Only)       Balance Overall balance assessment: Needs assistance         Standing balance support: Bilateral upper extremity supported Standing balance-Leahy Scale: Poor                              Cognition Arousal/Alertness: Awake/alert Behavior During Therapy: WFL for tasks assessed/performed Overall Cognitive Status: Within Functional Limits for tasks assessed                                        Exercises  Heel Raises 5 reps, Standing, AROM    General Comments        Pertinent Vitals/Pain Pain Assessment: No/denies pain    Home Living                       Prior Function            PT Goals (current goals can now be found in the care plan section) Progress towards PT goals: Progressing toward goals    Frequency    Min 3X/week      PT Plan Current plan remains appropriate    Co-evaluation              AM-PAC PT "6 Clicks" Mobility   Outcome Measure  Help needed turning from your back to your side while in a flat bed without using bedrails?: A Little Help needed moving from lying on your back to sitting on the side of a flat bed without using bedrails?: A Little Help needed moving to and from a bed to a chair (including a wheelchair)?: A Little Help needed standing up from a chair using your arms (e.g., wheelchair or bedside chair)?: A Little Help needed to walk in hospital room?: A Little Help needed climbing 3-5 steps with a railing? : A Little 6 Click Score: 18    End of Session Equipment  Utilized During Treatment: Gait belt Activity Tolerance: Patient tolerated treatment well Patient left: in bed;with call bell/phone within reach;with bed alarm set;with family/visitor present   PT Visit Diagnosis: Muscle weakness (generalized) (M62.81);Difficulty in walking, not elsewhere classified (R26.2);Unsteadiness on feet (R26.81);History of falling (Z91.81)     Time: 3545-6256 PT Time Calculation (min) (ACUTE ONLY): 8 min  Charges:  $Gait Training: 8-22 mins                        Faye Ramsay, PT Acute Rehabilitation  Office: [phone] Pager: [phone]

## 2020-08-24 NOTE — Procedures (Signed)
Interventional Radiology Procedure Note  Procedure: Visceral angiogram and coil embolization of the GDA.   Complications: None  Estimated Blood Loss: None  Recommendations: - Trend H&H, transfuse as needed - Clears are ok, further diet advancement per GI   Signed,  Sterling Big, MD

## 2020-08-24 NOTE — CV Procedure (Signed)
Echocardiogram not completed, patient undergoing personal care. As the schedule permits, echo maybe re-attempted this afternoon or in tomorrow.  Leta Jungling RDCS

## 2020-08-24 NOTE — Telephone Encounter (Signed)
Thanks--I had sent a note earlier to [facility] asking her to cancel it.  I know she is in the hospital and am following along with her care.

## 2020-08-25 ENCOUNTER — Inpatient Hospital Stay: Payer: Medicare Other | Admitting: Family Medicine

## 2020-08-25 ENCOUNTER — Inpatient Hospital Stay (HOSPITAL_COMMUNITY): Payer: Medicare Other

## 2020-08-25 DIAGNOSIS — I340 Nonrheumatic mitral (valve) insufficiency: Secondary | ICD-10-CM

## 2020-08-25 DIAGNOSIS — E440 Moderate protein-calorie malnutrition: Secondary | ICD-10-CM | POA: Insufficient documentation

## 2020-08-25 DIAGNOSIS — K269 Duodenal ulcer, unspecified as acute or chronic, without hemorrhage or perforation: Secondary | ICD-10-CM

## 2020-08-25 DIAGNOSIS — R55 Syncope and collapse: Secondary | ICD-10-CM

## 2020-08-25 LAB — CBC WITH DIFFERENTIAL/PLATELET
Abs Immature Granulocytes: 0.24 10*3/uL — ABNORMAL HIGH (ref 0.00–0.07)
Basophils Absolute: 0 10*3/uL (ref 0.0–0.1)
Basophils Relative: 0 %
Eosinophils Absolute: 0.4 10*3/uL (ref 0.0–0.5)
Eosinophils Relative: 3 %
HCT: 30.9 % — ABNORMAL LOW (ref 36.0–46.0)
Hemoglobin: 10.9 g/dL — ABNORMAL LOW (ref 12.0–15.0)
Immature Granulocytes: 2 %
Lymphocytes Relative: 12 %
Lymphs Abs: 1.6 10*3/uL (ref 0.7–4.0)
MCH: 31.8 pg (ref 26.0–34.0)
MCHC: 35.3 g/dL (ref 30.0–36.0)
MCV: 90.1 fL (ref 80.0–100.0)
Monocytes Absolute: 1.2 10*3/uL — ABNORMAL HIGH (ref 0.1–1.0)
Monocytes Relative: 9 %
Neutro Abs: 9.9 10*3/uL — ABNORMAL HIGH (ref 1.7–7.7)
Neutrophils Relative %: 74 %
Platelets: 189 10*3/uL (ref 150–400)
RBC: 3.43 MIL/uL — ABNORMAL LOW (ref 3.87–5.11)
RDW: 15.9 % — ABNORMAL HIGH (ref 11.5–15.5)
WBC: 13.3 10*3/uL — ABNORMAL HIGH (ref 4.0–10.5)
nRBC: 0 % (ref 0.0–0.2)

## 2020-08-25 LAB — HEMOGLOBIN AND HEMATOCRIT, BLOOD
HCT: 32.2 % — ABNORMAL LOW (ref 36.0–46.0)
Hemoglobin: 11.4 g/dL — ABNORMAL LOW (ref 12.0–15.0)

## 2020-08-25 LAB — ECHOCARDIOGRAM COMPLETE
Area-P 1/2: 3.85 cm2
Height: 62 in
S' Lateral: 2.2 cm
Weight: 1520.29 oz

## 2020-08-25 LAB — BASIC METABOLIC PANEL
Anion gap: 7 (ref 5–15)
BUN: 27 mg/dL — ABNORMAL HIGH (ref 8–23)
CO2: 25 mmol/L (ref 22–32)
Calcium: 7.8 mg/dL — ABNORMAL LOW (ref 8.9–10.3)
Chloride: 97 mmol/L — ABNORMAL LOW (ref 98–111)
Creatinine, Ser: 0.45 mg/dL (ref 0.44–1.00)
GFR calc Af Amer: >60 mL/min (ref >60)
GFR calc non Af Amer: >60 mL/min (ref >60)
Glucose, Bld: 127 mg/dL — ABNORMAL HIGH (ref 70–99)
Potassium: 3.2 mmol/L — ABNORMAL LOW (ref 3.5–5.1)
Sodium: 129 mmol/L — ABNORMAL LOW (ref 135–145)

## 2020-08-25 LAB — MAGNESIUM: Magnesium: 2 mg/dL (ref 1.7–2.4)

## 2020-08-25 MED ORDER — BOOST / RESOURCE BREEZE PO LIQD CUSTOM
1.0000 | Freq: Three times a day (TID) | ORAL | Status: DC
  Administered 2020-08-25 – 2020-08-26 (×4): 1 via ORAL

## 2020-08-25 MED ORDER — TRAMADOL HCL 50 MG PO TABS
50.0000 mg | ORAL_TABLET | Freq: Two times a day (BID) | ORAL | Status: AC | PRN
  Administered 2020-08-25: 50 mg via ORAL
  Filled 2020-08-25: qty 1

## 2020-08-25 MED ORDER — FENTANYL CITRATE (PF) 100 MCG/2ML IJ SOLN
12.5000 ug | Freq: Once | INTRAMUSCULAR | Status: AC
  Administered 2020-08-25: 12.5 ug via INTRAVENOUS
  Filled 2020-08-25: qty 2

## 2020-08-25 NOTE — Progress Notes (Signed)
PROGRESS NOTE    Elizabeth Haynes   JSR:[id number]  DOB: 1946/03/30  DOA: 08/22/2020     3  PCP: Joselyn Arrow, MD  CC: bloody stools, weakness   Hospital Course: Elizabeth Haynes is a 74 year old female with PMH of osteoporosis, osteopenia, recent rehab stay after fall with back injury who presented to the ED with complaints of progressive generalized weakness, decreased appetite, abdominal pain and syncopal episodes.    Patient's neighbor reported large, dark stool the day prior to admission and EDP noted maroon-colored stool on rectal exam which was guaiac positive.  Hemoglobin 5.6 on admission.  Admitted for symptomatic anemia due to suspected acute/subacute upper GI bleeding, acute blood loss anemia and syncope.  S/p 2 units PRBC transfusion on admission.  [facility] consulted and EGD on 9/27 revealed a large cratered duodenal ulcer, pigmentation and visible vessel, not amenable to endoscopic therapy. She then underwent coil embolization of the GDA with IR on 9/28.    Interval History:  Remains in her baseline state of confusion which is why she has her friend by her side most of the day who helps explain information to her and reminds her of things.  Her friend had arrived this morning during my evaluation and the patient was still in her usual confused state and confirmed by her friend. Hemoglobin was again low last night and she received 2 more units of PRBC.  Also some reports of ongoing maroon-colored stools but she is fairly asymptomatic with normal vital signs.  Old records reviewed in assessment of this patient  ROS: Constitutional: negative for chills and fevers, Respiratory: negative for cough, Cardiovascular: negative for chest pain and Gastrointestinal: negative for abdominal pain  Assessment & Plan: Symptomatic anemia secondary to acute GI blood loss: Hemoglobin 10.6 on 08/03/2020.  Presented with hemoglobin of 5.6.  S/p 2 units PRBC appropriately improved to 9.1 > 8.4.  - continue  trending and will transfuse further as needed  UGIB 2/2 duodenal ulcer - as seen on EGD on 9/27; ulcer not amenable for treatment during EGD -Patient underwent coil embolization with IR on 08/24/2020 -Hemoglobin down to 6.2 g/dL on evening of 08/21/4627.  Received 2 more units PRBC and hemoglobin is 10.9 g/dL after transfusion -Repeat hemoglobin again this evening, 08/25/2020 -Surgery consulted for further input in case of ongoing bleed.  Suspect this is resolving from coiling and blood still being evacuated from colon  -Continue PPI -Outpatient follow-up with GI after discharge  Syncope: Suspect due to symptomatic anemia.  Telemetry shows sinus rhythm and 8 beat NS SVT.  Continue telemetry monitoring.  Will check 2D echo.  Also check orthostatic vital signs.  Patient needs to be advised that she should not drive for 6 months, needs to be done prior to discharge.  CT head with chronic changes and no acute abnormalities. TSH normal.  Acute lower UTI: Treated with Ceftin as outpatient without relief of symptoms.  Symptoms then started to improve with Cipro.  Completing 3-day course.  Dehydration with hyponatremia: Serum sodium 128 on admission.  Improving on IV fluids.  Continue gentle IV fluids for additional 24 hours.  Falls: Related to weakness from symptomatic anemia and syncope.  PT recommends home health PT.  Body mass index is 17.38 kg/m.  Antimicrobials: Cipro 08/22/2020>> 08/23/2020  DVT prophylaxis: SCD Code Status: DNR Family Communication: Caretaker bedside Disposition Plan: Status is: Inpatient  Remains inpatient appropriate because:Unsafe d/c plan, IV treatments appropriate due to intensity of illness or inability to take PO  and Inpatient level of care appropriate due to severity of illness   Dispo: The patient is from: Home              Anticipated d/c is to: Home              Anticipated d/c date is: 1 day              Patient currently is not medically stable  to d/c.  Objective: Blood pressure 116/76, pulse 74, temperature 98.4 F (36.9 C), temperature source Oral, resp. rate 14, height 5\' 2"  (1.575 m), weight 43.1 kg, SpO2 100 %.  Examination: General appearance: alert, cooperative and no distress Head: Normocephalic, without obvious abnormality Eyes: EOMI Lungs: clear to auscultation bilaterally Heart: regular rate and rhythm and S1, S2 normal Abdomen: normal findings: bowel sounds normal and soft, non-tender Extremities: No edema Skin: mobility and turgor normal Neurologic: Grossly normal  Consultants:   GI  IR  Procedures:   EGD 08/23/2020  IR coil embolization of GDA, 08/24/2020  Data Reviewed: I have personally reviewed following labs and imaging studies Results for orders placed or performed during the hospital encounter of 08/22/20 (from the past 24 hour(s))  CBC     Status: Abnormal   Collection Time: 08/24/20  9:01 PM  Result Value Ref Range   WBC 13.5 (H) 4.0 - 10.5 K/uL   RBC 1.93 (L) 3.87 - 5.11 MIL/uL   Hemoglobin 6.2 (LL) 12.0 - 15.0 g/dL   HCT 96.0 (L) 36 - 46 %   MCV 94.3 80.0 - 100.0 fL   MCH 32.1 26.0 - 34.0 pg   MCHC 34.1 30.0 - 36.0 g/dL   RDW 45.4 09.8 - 11.9 %   Platelets 187 150 - 400 K/uL   nRBC 0.1 0.0 - 0.2 %  Prepare RBC (crossmatch)     Status: None   Collection Time: 08/24/20  9:42 PM  Result Value Ref Range   Order Confirmation      ORDER PROCESSED BY BLOOD BANK Performed at [facility], [address], [place], [place] [address]   Basic metabolic panel     Status: Abnormal   Collection Time: 08/25/20  8:25 AM  Result Value Ref Range   Sodium 129 (L) 135 - 145 mmol/L   Potassium 3.2 (L) 3.5 - 5.1 mmol/L   Chloride 97 (L) 98 - 111 mmol/L   CO2 25 22 - 32 mmol/L   Glucose, Bld 127 (H) 70 - 99 mg/dL   BUN 27 (H) 8 - 23 mg/dL   Creatinine, Ser 9.56 0.44 - 1.00 mg/dL   Calcium 7.8 (L) 8.9 - 10.3 mg/dL   GFR calc non Af Amer >60 >60 mL/min   GFR calc Af Amer >60 >60  mL/min   Anion gap 7 5 - 15  CBC with Differential/Platelet     Status: Abnormal   Collection Time: 08/25/20  8:25 AM  Result Value Ref Range   WBC 13.3 (H) 4.0 - 10.5 K/uL   RBC 3.43 (L) 3.87 - 5.11 MIL/uL   Hemoglobin 10.9 (L) 12.0 - 15.0 g/dL   HCT 21.3 (L) 36 - 46 %   MCV 90.1 80.0 - 100.0 fL   MCH 31.8 26.0 - 34.0 pg   MCHC 35.3 30.0 - 36.0 g/dL   RDW 08.6 (H) 57.8 - 46.9 %   Platelets 189 150 - 400 K/uL   nRBC 0.0 0.0 - 0.2 %   Neutrophils Relative % 74 %  Neutro Abs 9.9 (H) 1.7 - 7.7 K/uL   Lymphocytes Relative 12 %   Lymphs Abs 1.6 0.7 - 4.0 K/uL   Monocytes Relative 9 %   Monocytes Absolute 1.2 (H) 0 - 1 K/uL   Eosinophils Relative 3 %   Eosinophils Absolute 0.4 0 - 0 K/uL   Basophils Relative 0 %   Basophils Absolute 0.0 0 - 0 K/uL   Immature Granulocytes 2 %   Abs Immature Granulocytes 0.24 (H) 0.00 - 0.07 K/uL  Magnesium     Status: None   Collection Time: 08/25/20  8:25 AM  Result Value Ref Range   Magnesium 2.0 1.7 - 2.4 mg/dL    Recent Results (from the past 240 hour(s))  Respiratory Panel by RT PCR (Flu A&B, Covid) - Nasopharyngeal Swab     Status: None   Collection Time: 08/22/20 12:23 PM   Specimen: Nasopharyngeal Swab  Result Value Ref Range Status   SARS Coronavirus 2 by RT PCR NEGATIVE NEGATIVE Final    Comment: (NOTE) SARS-CoV-2 target nucleic acids are NOT DETECTED.  The SARS-CoV-2 RNA is generally detectable in upper respiratoy specimens during the acute phase of infection. The lowest concentration of SARS-CoV-2 viral copies this assay can detect is 131 copies/mL. A negative result does not preclude SARS-Cov-2 infection and should not be used as the sole basis for treatment or other patient management decisions. A negative result may occur with  improper specimen collection/handling, submission of specimen other than nasopharyngeal swab, presence of viral mutation(s) within the areas targeted by this assay, and inadequate number of viral  copies (<131 copies/mL). A negative result must be combined with clinical observations, patient history, and epidemiological information. The expected result is Negative.  Fact Sheet for Patients:  https://www.moore.com/  Fact Sheet for Healthcare Providers:  https://www.young.biz/  This test is no t yet approved or cleared by the [place] FDA and  has been authorized for detection and/or diagnosis of SARS-CoV-2 by FDA under an Emergency Use Authorization (EUA). This EUA will remain  in effect (meaning this test can be used) for the duration of the COVID-19 declaration under Section 564(b)(1) of the Act, 21 U.S.C. section 360bbb-3(b)(1), unless the authorization is terminated or revoked sooner.     Influenza A by PCR NEGATIVE NEGATIVE Final   Influenza B by PCR NEGATIVE NEGATIVE Final    Comment: (NOTE) The Xpert Xpress SARS-CoV-2/FLU/RSV assay is intended as an aid in  the diagnosis of influenza from Nasopharyngeal swab specimens and  should not be used as a sole basis for treatment. Nasal washings and  aspirates are unacceptable for Xpert Xpress SARS-CoV-2/FLU/RSV  testing.  Fact Sheet for Patients: https://www.moore.com/  Fact Sheet for Healthcare Providers: https://www.young.biz/  This test is not yet approved or cleared by the [place] FDA and  has been authorized for detection and/or diagnosis of SARS-CoV-2 by  FDA under an Emergency Use Authorization (EUA). This EUA will remain  in effect (meaning this test can be used) for the duration of the  Covid-19 declaration under Section 564(b)(1) of the Act, 21  U.S.C. section 360bbb-3(b)(1), unless the authorization is  terminated or revoked. Performed at [facility], [address] [place], [place] [address]   Urine culture     Status: Abnormal   Collection Time: 08/23/20 11:08 AM   Specimen: Urine, Random    Result Value Ref Range Status   Specimen Description   Final    URINE, RANDOM Performed at [facility],  [address] [place], [place] [address]    Special Requests   Final    NONE Performed at [facility], [address], [place], [place] [address]    Culture (A)  Final    <10,000 COLONIES/mL INSIGNIFICANT GROWTH Performed at [facility], [address], [place], [place] [address]    Report Status 08/24/2020 FINAL  Final     Radiology Studies: IR Angiogram Visceral Selective  Result Date: 08/24/2020 INDICATION: 74 year old female with significant upper GI bleeding from a large duodenal ulcer with visible vessel in the ulcer bed. She continued to have bleeding overnight with decreased hemoglobin this morning. She presents for visceral selective ultrasound and embolization. EXAM: SELECTIVE VISCERAL ARTERIOGRAPHY; IR ULTRASOUND GUIDANCE VASC ACCESS RIGHT; IR EMBO ART VEN HEMORR LYMPH EXTRAV INC GUIDE ROADMAPPING; ADDITIONAL ARTERIOGRAPHY MEDICATIONS: None ANESTHESIA/SEDATION: Moderate (conscious) sedation was employed during this procedure. A total of Versed 2 mg and Fentanyl 45 mcg was administered intravenously. Moderate Sedation Time: 40 minutes. The patient's level of consciousness and vital signs were monitored continuously by radiology nursing throughout the procedure under my direct supervision. CONTRAST:  45mL OMNIPAQUE IOHEXOL 300 MG/ML  SOLN FLUOROSCOPY TIME:  Fluoroscopy Time: 10 minutes 54 seconds (240 mGy). COMPLICATIONS: None immediate. PROCEDURE: Informed consent was obtained from the patient following explanation of the procedure, risks, benefits and alternatives. The patient understands, agrees and consents for the procedure. All questions were addressed. A time out was performed prior to the initiation of the procedure. Maximal barrier sterile technique utilized including caps, mask, sterile gowns, sterile gloves, large sterile  drape, hand hygiene, and Betadine prep. The right common femoral artery was interrogated with ultrasound and found to be widely patent. An image was obtained and stored for the medical record. Local anesthesia was attained by infiltration with 1% lidocaine. A small dermatotomy was made. Under real-time sonographic guidance, the vessel was punctured with a 21 gauge micropuncture needle. Using standard technique, the initial micro needle was exchanged over a 0.018 micro wire for a transitional 4 [place] micro sheath. The micro sheath was then exchanged over a 0.035 wire for a 5 French vascular sheath. A C2 cobra catheter was advanced over a Bentson wire into the abdominal aorta. The catheter was used to select the superior mesenteric artery. A superior mesenteric arteriogram was performed. No evidence of replaced right hepatic or enlarged pancreaticoduodenal arteries. Next, the C2 catheter was used to select the celiac axis. A celiac arteriogram was performed. Conventional hepatic arterial anatomy. There is a focal region of narrowing in the mid aspect of the gastroduodenal artery concerning for a site of vascular injury. Due to the down sloping configuration of the celiac axis, the C2 cobra catheter was exchanged for a Sos Omni selective catheter which was successfully engaged into the celiac artery. A renegade STC microcatheter was then advanced over a Fathom 16 wire into the gastroduodenal artery. Arteriography was performed. This confirms a focal narrowing in the mid gastroduodenal artery. Coil embolization was then performed using a combination of interlock and Ruby detachable microcoils. The mid and distal gastroduodenal artery were successfully coiled to stasis. Contrast injection into the gastroduodenal stump demonstrates reflux back into the hepatic artery. There is no forward flow. The catheter system was removed. Hemostasis was attained using a Cordis extra arterial vascular plug. IMPRESSION: 1. Focal region  of narrowing in the mid aspect of the gastroduodenal artery concerning for region of spasm secondary to irritation from the overlying ulcer bed. This may represent the  source of intermittent bleeding. 2. Successful coil embolization of the gastroduodenal artery. Signed, Sterling BigHeath K. McCullough, MD, RPVI [facility] Electronically Signed   By: Malachy MoanHeath  McCullough M.D.   On: 08/24/2020 15:45   IR Angiogram Visceral Selective  Result Date: 08/24/2020 INDICATION: 74 year old female with significant upper GI bleeding from a large duodenal ulcer with visible vessel in the ulcer bed. She continued to have bleeding overnight with decreased hemoglobin this morning. She presents for visceral selective ultrasound and embolization. EXAM: SELECTIVE VISCERAL ARTERIOGRAPHY; IR ULTRASOUND GUIDANCE VASC ACCESS RIGHT; IR EMBO ART VEN HEMORR LYMPH EXTRAV INC GUIDE ROADMAPPING; ADDITIONAL ARTERIOGRAPHY MEDICATIONS: None ANESTHESIA/SEDATION: Moderate (conscious) sedation was employed during this procedure. A total of Versed 2 mg and Fentanyl 45 mcg was administered intravenously. Moderate Sedation Time: 40 minutes. The patient's level of consciousness and vital signs were monitored continuously by radiology nursing throughout the procedure under my direct supervision. CONTRAST:  45mL OMNIPAQUE IOHEXOL 300 MG/ML  SOLN FLUOROSCOPY TIME:  Fluoroscopy Time: 10 minutes 54 seconds (240 mGy). COMPLICATIONS: None immediate. PROCEDURE: Informed consent was obtained from the patient following explanation of the procedure, risks, benefits and alternatives. The patient understands, agrees and consents for the procedure. All questions were addressed. A time out was performed prior to the initiation of the procedure. Maximal barrier sterile technique utilized including caps, mask, sterile gowns, sterile gloves, large sterile drape, hand hygiene, and Betadine prep. The right common femoral  artery was interrogated with ultrasound and found to be widely patent. An image was obtained and stored for the medical record. Local anesthesia was attained by infiltration with 1% lidocaine. A small dermatotomy was made. Under real-time sonographic guidance, the vessel was punctured with a 21 gauge micropuncture needle. Using standard technique, the initial micro needle was exchanged over a 0.018 micro wire for a transitional 4 [place] micro sheath. The micro sheath was then exchanged over a 0.035 wire for a 5 French vascular sheath. A C2 cobra catheter was advanced over a Bentson wire into the abdominal aorta. The catheter was used to select the superior mesenteric artery. A superior mesenteric arteriogram was performed. No evidence of replaced right hepatic or enlarged pancreaticoduodenal arteries. Next, the C2 catheter was used to select the celiac axis. A celiac arteriogram was performed. Conventional hepatic arterial anatomy. There is a focal region of narrowing in the mid aspect of the gastroduodenal artery concerning for a site of vascular injury. Due to the down sloping configuration of the celiac axis, the C2 cobra catheter was exchanged for a Sos Omni selective catheter which was successfully engaged into the celiac artery. A renegade STC microcatheter was then advanced over a Fathom 16 wire into the gastroduodenal artery. Arteriography was performed. This confirms a focal narrowing in the mid gastroduodenal artery. Coil embolization was then performed using a combination of interlock and Ruby detachable microcoils. The mid and distal gastroduodenal artery were successfully coiled to stasis. Contrast injection into the gastroduodenal stump demonstrates reflux back into the hepatic artery. There is no forward flow. The catheter system was removed. Hemostasis was attained using a Cordis extra arterial vascular plug. IMPRESSION: 1. Focal region of narrowing in the mid aspect of the gastroduodenal artery  concerning for region of spasm secondary to irritation from the overlying ulcer bed. This may represent the source of intermittent bleeding. 2. Successful coil embolization of the gastroduodenal artery. Signed, Sterling BigHeath K. McCullough, MD, RPVI [facility] Electronically Signed   By: Malachy MoanHeath  McCullough  M.D.   On: 08/24/2020 15:45   IR Angiogram Selective Each Additional Vessel  Result Date: 08/24/2020 INDICATION: 74 year old female with significant upper GI bleeding from a large duodenal ulcer with visible vessel in the ulcer bed. She continued to have bleeding overnight with decreased hemoglobin this morning. She presents for visceral selective ultrasound and embolization. EXAM: SELECTIVE VISCERAL ARTERIOGRAPHY; IR ULTRASOUND GUIDANCE VASC ACCESS RIGHT; IR EMBO ART VEN HEMORR LYMPH EXTRAV INC GUIDE ROADMAPPING; ADDITIONAL ARTERIOGRAPHY MEDICATIONS: None ANESTHESIA/SEDATION: Moderate (conscious) sedation was employed during this procedure. A total of Versed 2 mg and Fentanyl 45 mcg was administered intravenously. Moderate Sedation Time: 40 minutes. The patient's level of consciousness and vital signs were monitored continuously by radiology nursing throughout the procedure under my direct supervision. CONTRAST:  45mL OMNIPAQUE IOHEXOL 300 MG/ML  SOLN FLUOROSCOPY TIME:  Fluoroscopy Time: 10 minutes 54 seconds (240 mGy). COMPLICATIONS: None immediate. PROCEDURE: Informed consent was obtained from the patient following explanation of the procedure, risks, benefits and alternatives. The patient understands, agrees and consents for the procedure. All questions were addressed. A time out was performed prior to the initiation of the procedure. Maximal barrier sterile technique utilized including caps, mask, sterile gowns, sterile gloves, large sterile drape, hand hygiene, and Betadine prep. The right common femoral artery was interrogated with ultrasound and found  to be widely patent. An image was obtained and stored for the medical record. Local anesthesia was attained by infiltration with 1% lidocaine. A small dermatotomy was made. Under real-time sonographic guidance, the vessel was punctured with a 21 gauge micropuncture needle. Using standard technique, the initial micro needle was exchanged over a 0.018 micro wire for a transitional 4 [place] micro sheath. The micro sheath was then exchanged over a 0.035 wire for a 5 French vascular sheath. A C2 cobra catheter was advanced over a Bentson wire into the abdominal aorta. The catheter was used to select the superior mesenteric artery. A superior mesenteric arteriogram was performed. No evidence of replaced right hepatic or enlarged pancreaticoduodenal arteries. Next, the C2 catheter was used to select the celiac axis. A celiac arteriogram was performed. Conventional hepatic arterial anatomy. There is a focal region of narrowing in the mid aspect of the gastroduodenal artery concerning for a site of vascular injury. Due to the down sloping configuration of the celiac axis, the C2 cobra catheter was exchanged for a Sos Omni selective catheter which was successfully engaged into the celiac artery. A renegade STC microcatheter was then advanced over a Fathom 16 wire into the gastroduodenal artery. Arteriography was performed. This confirms a focal narrowing in the mid gastroduodenal artery. Coil embolization was then performed using a combination of interlock and Ruby detachable microcoils. The mid and distal gastroduodenal artery were successfully coiled to stasis. Contrast injection into the gastroduodenal stump demonstrates reflux back into the hepatic artery. There is no forward flow. The catheter system was removed. Hemostasis was attained using a Cordis extra arterial vascular plug. IMPRESSION: 1. Focal region of narrowing in the mid aspect of the gastroduodenal artery concerning for region of spasm secondary to irritation  from the overlying ulcer bed. This may represent the source of intermittent bleeding. 2. Successful coil embolization of the gastroduodenal artery. Signed, Sterling Big, MD, RPVI [facility] Electronically Signed   By: Malachy Moan M.D.   On: 08/24/2020 15:45   IR US Guide Vasc Access Right  Result Date: 08/24/2020 INDICATION: 74 year old female with significant upper GI bleeding from a large duodenal ulcer  with visible vessel in the ulcer bed. She continued to have bleeding overnight with decreased hemoglobin this morning. She presents for visceral selective ultrasound and embolization. EXAM: SELECTIVE VISCERAL ARTERIOGRAPHY; IR ULTRASOUND GUIDANCE VASC ACCESS RIGHT; IR EMBO ART VEN HEMORR LYMPH EXTRAV INC GUIDE ROADMAPPING; ADDITIONAL ARTERIOGRAPHY MEDICATIONS: None ANESTHESIA/SEDATION: Moderate (conscious) sedation was employed during this procedure. A total of Versed 2 mg and Fentanyl 45 mcg was administered intravenously. Moderate Sedation Time: 40 minutes. The patient's level of consciousness and vital signs were monitored continuously by radiology nursing throughout the procedure under my direct supervision. CONTRAST:  45mL OMNIPAQUE IOHEXOL 300 MG/ML  SOLN FLUOROSCOPY TIME:  Fluoroscopy Time: 10 minutes 54 seconds (240 mGy). COMPLICATIONS: None immediate. PROCEDURE: Informed consent was obtained from the patient following explanation of the procedure, risks, benefits and alternatives. The patient understands, agrees and consents for the procedure. All questions were addressed. A time out was performed prior to the initiation of the procedure. Maximal barrier sterile technique utilized including caps, mask, sterile gowns, sterile gloves, large sterile drape, hand hygiene, and Betadine prep. The right common femoral artery was interrogated with ultrasound and found to be widely patent. An image was obtained and stored for the medical  record. Local anesthesia was attained by infiltration with 1% lidocaine. A small dermatotomy was made. Under real-time sonographic guidance, the vessel was punctured with a 21 gauge micropuncture needle. Using standard technique, the initial micro needle was exchanged over a 0.018 micro wire for a transitional 4 [place] micro sheath. The micro sheath was then exchanged over a 0.035 wire for a 5 French vascular sheath. A C2 cobra catheter was advanced over a Bentson wire into the abdominal aorta. The catheter was used to select the superior mesenteric artery. A superior mesenteric arteriogram was performed. No evidence of replaced right hepatic or enlarged pancreaticoduodenal arteries. Next, the C2 catheter was used to select the celiac axis. A celiac arteriogram was performed. Conventional hepatic arterial anatomy. There is a focal region of narrowing in the mid aspect of the gastroduodenal artery concerning for a site of vascular injury. Due to the down sloping configuration of the celiac axis, the C2 cobra catheter was exchanged for a Sos Omni selective catheter which was successfully engaged into the celiac artery. A renegade STC microcatheter was then advanced over a Fathom 16 wire into the gastroduodenal artery. Arteriography was performed. This confirms a focal narrowing in the mid gastroduodenal artery. Coil embolization was then performed using a combination of interlock and Ruby detachable microcoils. The mid and distal gastroduodenal artery were successfully coiled to stasis. Contrast injection into the gastroduodenal stump demonstrates reflux back into the hepatic artery. There is no forward flow. The catheter system was removed. Hemostasis was attained using a Cordis extra arterial vascular plug. IMPRESSION: 1. Focal region of narrowing in the mid aspect of the gastroduodenal artery concerning for region of spasm secondary to irritation from the overlying ulcer bed. This may represent the source of  intermittent bleeding. 2. Successful coil embolization of the gastroduodenal artery. Signed, Sterling Big, MD, RPVI [facility] Radiology Electronically Signed   By: Malachy Moan M.D.   On: 08/24/2020 15:45   ECHOCARDIOGRAM COMPLETE  Result Date: 08/25/2020    ECHOCARDIOGRAM REPORT   Patient Name:   Elizabeth Haynes Date of Exam: 08/25/2020 Medical Rec #:  [id number]     Height:       62.0 in Accession #:    [id number]    Weight:  95.0 lb Date of Birth:  1946-05-23    BSA:          1.393 m Patient Age:    73 years      BP:           116/76 mmHg Patient Gender: F             HR:           74 bpm. Exam Location:  Inpatient Procedure: 2D Echo Indications:    780.2 syncope  History:        Patient has no prior history of Echocardiogram examinations.  Sonographer:    Celene Skeen RDCS (AE) Referring Phys: [id number] ANAND D HONGALGI IMPRESSIONS  1. Left ventricular ejection fraction, by estimation, is 60 to 65%. The left ventricle has normal function. The left ventricle has no regional wall motion abnormalities. Left ventricular diastolic parameters are consistent with Grade II diastolic dysfunction (pseudonormalization).  2. Right ventricular systolic function is normal. The right ventricular size is normal.  3. The mitral valve is normal in structure. Mild mitral valve regurgitation. No evidence of mitral stenosis.  4. The aortic valve is normal in structure. Aortic valve regurgitation is not visualized. No aortic stenosis is present.  5. The inferior vena cava is normal in size with greater than 50% respiratory variability, suggesting right atrial pressure of 3 mmHg. FINDINGS  Left Ventricle: Left ventricular ejection fraction, by estimation, is 60 to 65%. The left ventricle has normal function. The left ventricle has no regional wall motion abnormalities. The left ventricular internal cavity size was normal in size. There is  no left ventricular  hypertrophy. Left ventricular diastolic parameters are consistent with Grade II diastolic dysfunction (pseudonormalization). Right Ventricle: The right ventricular size is normal. No increase in right ventricular wall thickness. Right ventricular systolic function is normal. Left Atrium: Left atrial size was normal in size. Right Atrium: Right atrial size was normal in size. Pericardium: There is no evidence of pericardial effusion. Mitral Valve: The mitral valve is normal in structure. Mild mitral valve regurgitation. No evidence of mitral valve stenosis. Tricuspid Valve: The tricuspid valve is normal in structure. Tricuspid valve regurgitation is not demonstrated. No evidence of tricuspid stenosis. Aortic Valve: The aortic valve is normal in structure. Aortic valve regurgitation is not visualized. No aortic stenosis is present. Pulmonic Valve: The pulmonic valve was normal in structure. Pulmonic valve regurgitation is not visualized. No evidence of pulmonic stenosis. Aorta: The aortic root is normal in size and structure. Venous: The inferior vena cava is normal in size with greater than 50% respiratory variability, suggesting right atrial pressure of 3 mmHg. IAS/Shunts: No atrial level shunt detected by color flow Doppler.  LEFT VENTRICLE PLAX 2D LVIDd:         4.20 cm  Diastology LVIDs:         2.20 cm  LV e' medial:    7.18 cm/s LV PW:         0.80 cm  LV E/e' medial:  11.3 LV IVS:        0.60 cm  LV e' lateral:   10.80 cm/s LVOT diam:     1.80 cm  LV E/e' lateral: 7.5 LV SV:         46 LV SV Index:   33 LVOT Area:     2.54 cm  RIGHT VENTRICLE RV S prime:     16.30 cm/s TAPSE (M-mode): 2.3 cm LEFT ATRIUM  Index       RIGHT ATRIUM           Index LA diam:        2.60 cm 1.87 cm/m  RA Area:     10.80 cm LA Vol (A2C):   26.5 ml 19.02 ml/m RA Volume:   20.60 ml  14.78 ml/m LA Vol (A4C):   22.4 ml 16.08 ml/m LA Biplane Vol: 25.2 ml 18.09 ml/m  AORTIC VALVE LVOT Vmax:   88.80 cm/s LVOT Vmean:   65.100 cm/s LVOT VTI:    0.182 m  AORTA Ao Root diam: 2.60 cm MITRAL VALVE MV Area (PHT): 3.85 cm    SHUNTS MV Decel Time: 197 msec    Systemic VTI:  0.18 m MV E velocity: 81.40 cm/s  Systemic Diam: 1.80 cm MV A velocity: 66.00 cm/s MV E/A ratio:  1.23 Donato Schultz MD Electronically signed by Donato Schultz MD Signature Date/Time: 08/25/2020/2:41:28 PM    Final    IR EMBO ART  VEN HEMORR LYMPH EXTRAV  INC GUIDE ROADMAPPING  Result Date: 08/24/2020 INDICATION: 74 year old female with significant upper GI bleeding from a large duodenal ulcer with visible vessel in the ulcer bed. She continued to have bleeding overnight with decreased hemoglobin this morning. She presents for visceral selective ultrasound and embolization. EXAM: SELECTIVE VISCERAL ARTERIOGRAPHY; IR ULTRASOUND GUIDANCE VASC ACCESS RIGHT; IR EMBO ART VEN HEMORR LYMPH EXTRAV INC GUIDE ROADMAPPING; ADDITIONAL ARTERIOGRAPHY MEDICATIONS: None ANESTHESIA/SEDATION: Moderate (conscious) sedation was employed during this procedure. A total of Versed 2 mg and Fentanyl 45 mcg was administered intravenously. Moderate Sedation Time: 40 minutes. The patient's level of consciousness and vital signs were monitored continuously by radiology nursing throughout the procedure under my direct supervision. CONTRAST:  45mL OMNIPAQUE IOHEXOL 300 MG/ML  SOLN FLUOROSCOPY TIME:  Fluoroscopy Time: 10 minutes 54 seconds (240 mGy). COMPLICATIONS: None immediate. PROCEDURE: Informed consent was obtained from the patient following explanation of the procedure, risks, benefits and alternatives. The patient understands, agrees and consents for the procedure. All questions were addressed. A time out was performed prior to the initiation of the procedure. Maximal barrier sterile technique utilized including caps, mask, sterile gowns, sterile gloves, large sterile drape, hand hygiene, and Betadine prep. The right common femoral artery was interrogated with ultrasound and found to be widely  patent. An image was obtained and stored for the medical record. Local anesthesia was attained by infiltration with 1% lidocaine. A small dermatotomy was made. Under real-time sonographic guidance, the vessel was punctured with a 21 gauge micropuncture needle. Using standard technique, the initial micro needle was exchanged over a 0.018 micro wire for a transitional 4 [place] micro sheath. The micro sheath was then exchanged over a 0.035 wire for a 5 French vascular sheath. A C2 cobra catheter was advanced over a Bentson wire into the abdominal aorta. The catheter was used to select the superior mesenteric artery. A superior mesenteric arteriogram was performed. No evidence of replaced right hepatic or enlarged pancreaticoduodenal arteries. Next, the C2 catheter was used to select the celiac axis. A celiac arteriogram was performed. Conventional hepatic arterial anatomy. There is a focal region of narrowing in the mid aspect of the gastroduodenal artery concerning for a site of vascular injury. Due to the down sloping configuration of the celiac axis, the C2 cobra catheter was exchanged for a Sos Omni selective catheter which was successfully engaged into the celiac artery. A renegade STC microcatheter was then advanced over a Fathom 16 wire into the gastroduodenal artery.  Arteriography was performed. This confirms a focal narrowing in the mid gastroduodenal artery. Coil embolization was then performed using a combination of interlock and Ruby detachable microcoils. The mid and distal gastroduodenal artery were successfully coiled to stasis. Contrast injection into the gastroduodenal stump demonstrates reflux back into the hepatic artery. There is no forward flow. The catheter system was removed. Hemostasis was attained using a Cordis extra arterial vascular plug. IMPRESSION: 1. Focal region of narrowing in the mid aspect of the gastroduodenal artery concerning for region of spasm secondary to irritation from the  overlying ulcer bed. This may represent the source of intermittent bleeding. 2. Successful coil embolization of the gastroduodenal artery. Signed, Sterling Big, MD, RPVI [facility] Radiology Electronically Signed   By: Malachy Moan M.D.   On: 08/24/2020 15:45   IR Angiogram Visceral Selective  Final Result    IR US Guide Vasc Access Right  Final Result    IR Angiogram Selective Each Additional Vessel  Final Result    IR Angiogram Visceral Selective  Final Result    IR EMBO ART  VEN HEMORR LYMPH EXTRAV  INC GUIDE ROADMAPPING  Final Result    CT Head Wo Contrast  Final Result    DG Chest 2 View  Final Result      Scheduled Meds:  sodium chloride   Intravenous Once   B-complex with vitamin C  1 tablet Oral Daily   calcium carbonate  1,250 mg Oral BID WC   cholecalciferol  1,000 Units Oral Daily   feeding supplement  1 Container Oral TID BM   multivitamin with minerals  1 tablet Oral Daily   [START ON 08/27/2020] pantoprazole  40 mg Intravenous Q12H   vitamin E  800 Units Oral Daily   PRN Meds: acetaminophen **OR** acetaminophen, iohexol, melatonin, olopatadine, ondansetron **OR** ondansetron (ZOFRAN) IV Continuous Infusions:  pantoprozole (PROTONIX) infusion 8 mg/hr (08/25/20 1447)      LOS: 3 days  Time spent: Greater than 50% of the 35 minute visit was spent in counseling/coordination of care for the patient as laid out in the A&P.   Lewie Chamber, MD [facility] 08/25/2020, 5:34 PM  Contact via secure chat.  To contact the attending provider between 7A-7P or the covering provider during after hours 7P-7A, please log into the web site [email or web address] and access using universal [place] password for that web site. If you do not have the password, please call the hospital operator.

## 2020-08-25 NOTE — Progress Notes (Addendum)
Subjective: As per patient and her nurse, she has had 5-6 maroon bowel movements overnight and today morning. Yesterday, prior to IR guided embolization, stools were black and tarry. She complains of feeling weak and is requesting for diet to be advanced.  Objective: Vital signs in last 24 hours: Temp:  [97.8 F (36.6 C)-98.4 F (36.9 C)] 97.8 F (36.6 C) (09/29 0611) Pulse Rate:  [70-90] 72 (09/29 0611) Resp:  [16-18] 17 (09/29 0611) BP: (96-128)/(53-72) 125/69 (09/29 0611) SpO2:  [97 %-100 %] 99 % (09/29 0611) Weight change:  Last BM Date: 08/24/20  PE: Thinly built, prominent pallor GENERAL: Dry oral mucous membrane, able to speak in full sentences ABDOMEN: Soft, nontender, normoactive bowel sounds EXTREMITIES: No deformity  Lab Results: Results for orders placed or performed during the hospital encounter of 08/22/20 (from the past 48 hour(s))  Surgical pathology     Status: None   Collection Time: 08/23/20  1:56 PM  Result Value Ref Range   SURGICAL PATHOLOGY      SURGICAL PATHOLOGY CASE: [id number] PATIENT: Lottie Dawson Surgical Pathology Report     Clinical History: Melena, anemia (crm)   FINAL MICROSCOPIC DIAGNOSIS:  A. STOMACH, ANTRUM, BIOPSY: -  Mild chronic gastritis with reactive changes -  No H. pylori or intestinal metaplasia identified -  See comment  COMMENT:  Warthin-Starry stain is NEGATIVE for organisms morphologically consistent with Helicobacter pylori.  GROSS DESCRIPTION:  Received in formalin is a tan, soft tissue fragment that is submitted in toto.  Size: 0.6 cm, 1 block submitted.   Lovey Newcomer 08/24/2020)    Final Diagnosis performed by Manning Charity, MD.   Electronically signed 08/24/2020 Technical component performed at [facility], [address] [place], [place] [address].  Professional component performed at [employer], [address], [place], [place] [address].  Immunohistochemistry  Technical component (if applicable) was performed at [facility]. [address], [place], [place] [address].   IMMUNOHISTOCHEMISTRY DISCLAIMER (if applicable): Some of these immunohistochemical stains may have been developed and the performance characteristics determine by [facility]. Some may not have been cleared or approved by the U.S. Food and Drug Administration. The FDA has determined that such clearance or approval is not necessary. This test is used for clinical purposes. It should not be regarded as investigational or for research. This laboratory is certified under the Clinical Laboratory Improvement Amendments of 1988 (CLIA-88) as qualified to perform high complexity clinical laboratory testing.  The controls stained appropriately.   CBC     Status: Abnormal   Collection Time: 08/23/20  5:05 PM  Result Value Ref Range   WBC 12.6 (H) 4.0 - 10.5 K/uL   RBC 2.11 (L) 3.87 - 5.11 MIL/uL   Hemoglobin 6.6 (LL) 12.0 - 15.0 g/dL    Comment: REPEATED TO VERIFY THIS CRITICAL RESULT HAS VERIFIED AND BEEN CALLED TO N.ARAYA BY NATHAN THOMPSON ON 09 27 2021 AT 1738, AND HAS BEEN READ BACK. CRITICAL RESULT VERIFIED    HCT 19.4 (L) 36 - 46 %   MCV 91.9 80.0 - 100.0 fL   MCH 31.3 26.0 - 34.0 pg   MCHC 34.0 30.0 - 36.0 g/dL   RDW 91.4 78.2 - 95.6 %   Platelets 292 150 - 400 K/uL   nRBC 0.0 0.0 - 0.2 %    Comment: Performed at [facility], [address] [place], [place] [address]  TSH     Status: None  Collection Time: 08/23/20  5:05 PM  Result Value Ref Range   TSH 0.628 0.350 - 4.500 uIU/mL    Comment: Performed by a 3rd Generation assay with a functional sensitivity of <=0.01 uIU/mL. Performed at [facility], [address] [place], [place] [address]   Ferritin     Status: None   Collection Time: 08/23/20  5:05 PM  Result Value Ref Range   Ferritin 60 11 - 307 ng/mL    Comment: Performed at  [facility], [address], [place], [place] [address]  Prepare RBC (crossmatch)     Status: None   Collection Time: 08/23/20  5:52 PM  Result Value Ref Range   Order Confirmation      ORDER PROCESSED BY BLOOD BANK Performed at [facility], [address], [place], [place] [address]   Basic metabolic panel     Status: Abnormal   Collection Time: 08/24/20  4:37 AM  Result Value Ref Range   Sodium 132 (L) 135 - 145 mmol/L   Potassium 3.6 3.5 - 5.1 mmol/L   Chloride 100 98 - 111 mmol/L   CO2 26 22 - 32 mmol/L   Glucose, Bld 120 (H) 70 - 99 mg/dL    Comment: Glucose reference range applies only to samples taken after fasting for at least 8 hours.   BUN 32 (H) 8 - 23 mg/dL   Creatinine, Ser 1.74 0.44 - 1.00 mg/dL   Calcium 7.8 (L) 8.9 - 10.3 mg/dL   GFR calc non Af Amer >60 >60 mL/min   GFR calc Af Amer >60 >60 mL/min   Anion gap 6 5 - 15    Comment: Performed at [facility], [address], [place], [place] [address]  CBC with Differential/Platelet     Status: Abnormal   Collection Time: 08/24/20  4:37 AM  Result Value Ref Range   WBC 13.8 (H) 4.0 - 10.5 K/uL   RBC 2.90 (L) 3.87 - 5.11 MIL/uL   Hemoglobin 9.4 (L) 12.0 - 15.0 g/dL    Comment: REPEATED TO VERIFY   HCT 26.5 (L) 36 - 46 %   MCV 91.4 80.0 - 100.0 fL   MCH 32.4 26.0 - 34.0 pg   MCHC 35.5 30.0 - 36.0 g/dL   RDW 81.8 56.3 - 14.9 %   Platelets 214 150 - 400 K/uL   nRBC 0.2 0.0 - 0.2 %   Neutrophils Relative % 73 %   Neutro Abs 10.1 (H) 1.7 - 7.7 K/uL   Lymphocytes Relative 15 %   Lymphs Abs 2.1 0.7 - 4.0 K/uL   Monocytes Relative 8 %   Monocytes Absolute 1.1 (H) 0 - 1 K/uL   Eosinophils Relative 2 %   Eosinophils Absolute 0.3 0 - 0 K/uL   Basophils Relative 0 %   Basophils Absolute 0.0 0 - 0 K/uL   Immature Granulocytes 2 %   Abs Immature Granulocytes 0.21 (H) 0.00 - 0.07 K/uL    Comment: Performed at [facility], [address], [place], [place] [address]  Protime-INR     Status: None   Collection Time: 08/24/20  4:37 AM  Result Value Ref Range   Prothrombin Time 14.4 11.4 - 15.2 seconds   INR 1.2 0.8 - 1.2    Comment: (NOTE) INR goal varies based on device and disease states. Performed at [facility], [address] [place], [place] [address]   CBC  Status: Abnormal   Collection Time: 08/24/20  2:12 PM  Result Value Ref Range   WBC 13.4 (H) 4.0 - 10.5 K/uL   RBC 2.28 (L) 3.87 - 5.11 MIL/uL   Hemoglobin 7.5 (L) 12.0 - 15.0 g/dL   HCT 30.8 (L) 36 - 46 %   MCV 93.4 80.0 - 100.0 fL   MCH 32.9 26.0 - 34.0 pg   MCHC 35.2 30.0 - 36.0 g/dL   RDW 65.7 84.6 - 96.2 %   Platelets 213 150 - 400 K/uL   nRBC 0.2 0.0 - 0.2 %    Comment: Performed at [facility], [address] [place], [place] [address]  CBC     Status: Abnormal   Collection Time: 08/24/20  9:01 PM  Result Value Ref Range   WBC 13.5 (H) 4.0 - 10.5 K/uL   RBC 1.93 (L) 3.87 - 5.11 MIL/uL   Hemoglobin 6.2 (LL) 12.0 - 15.0 g/dL    Comment: This critical result has verified and been called to [facility]. RN by Jani Gravel on 09 28 2021 at 2117, and has been read back. CRITICAL RESULT VERIFIED   HCT 18.2 (L) 36 - 46 %   MCV 94.3 80.0 - 100.0 fL   MCH 32.1 26.0 - 34.0 pg   MCHC 34.1 30.0 - 36.0 g/dL   RDW 13.2 44.0 - 10.2 %   Platelets 187 150 - 400 K/uL   nRBC 0.1 0.0 - 0.2 %    Comment: Performed at [facility], [address], [place], [place] [address]  Prepare RBC (crossmatch)     Status: None   Collection Time: 08/24/20  9:42 PM  Result Value Ref Range   Order Confirmation      ORDER PROCESSED BY BLOOD BANK Performed at [facility], [address], [place], [place] [address]   Basic metabolic panel     Status: Abnormal   Collection Time: 08/25/20  8:25 AM  Result Value Ref Range   Sodium 129 (L) 135 - 145 mmol/L   Potassium 3.2 (L) 3.5 - 5.1 mmol/L    Chloride 97 (L) 98 - 111 mmol/L   CO2 25 22 - 32 mmol/L   Glucose, Bld 127 (H) 70 - 99 mg/dL    Comment: Glucose reference range applies only to samples taken after fasting for at least 8 hours.   BUN 27 (H) 8 - 23 mg/dL   Creatinine, Ser 4.74 0.44 - 1.00 mg/dL   Calcium 7.8 (L) 8.9 - 10.3 mg/dL   GFR calc non Af Amer >60 >60 mL/min   GFR calc Af Amer >60 >60 mL/min   Anion gap 7 5 - 15    Comment: Performed at [facility], [address] [place], [place] [address]  CBC with Differential/Platelet     Status: Abnormal   Collection Time: 08/25/20  8:25 AM  Result Value Ref Range   WBC 13.3 (H) 4.0 - 10.5 K/uL   RBC 3.43 (L) 3.87 - 5.11 MIL/uL   Hemoglobin 10.9 (L) 12.0 - 15.0 g/dL    Comment: REPEATED TO VERIFY POST TRANSFUSION SPECIMEN DELTA CHECK NOTED    HCT 30.9 (L) 36 - 46 %   MCV 90.1 80.0 - 100.0 fL   MCH 31.8 26.0 - 34.0 pg   MCHC 35.3 30.0 - 36.0 g/dL   RDW 38.7 (H) 56.4 - 33.2 %   Platelets 189 150 - 400 K/uL   nRBC 0.0 0.0 -  0.2 %   Neutrophils Relative % 74 %   Neutro Abs 9.9 (H) 1.7 - 7.7 K/uL   Lymphocytes Relative 12 %   Lymphs Abs 1.6 0.7 - 4.0 K/uL   Monocytes Relative 9 %   Monocytes Absolute 1.2 (H) 0 - 1 K/uL   Eosinophils Relative 3 %   Eosinophils Absolute 0.4 0 - 0 K/uL   Basophils Relative 0 %   Basophils Absolute 0.0 0 - 0 K/uL   Immature Granulocytes 2 %   Abs Immature Granulocytes 0.24 (H) 0.00 - 0.07 K/uL    Comment: Performed at [facility], [address], [place], [place] [address]  Magnesium     Status: None   Collection Time: 08/25/20  8:25 AM  Result Value Ref Range   Magnesium 2.0 1.7 - 2.4 mg/dL    Comment: Performed at [facility], [address], [place], [place] [address]    Studies/Results: IR Angiogram Visceral Selective  Result Date: 08/24/2020 INDICATION: 74 year old female with significant upper GI bleeding from a large duodenal ulcer with visible vessel in the  ulcer bed. She continued to have bleeding overnight with decreased hemoglobin this morning. She presents for visceral selective ultrasound and embolization. EXAM: SELECTIVE VISCERAL ARTERIOGRAPHY; IR ULTRASOUND GUIDANCE VASC ACCESS RIGHT; IR EMBO ART VEN HEMORR LYMPH EXTRAV INC GUIDE ROADMAPPING; ADDITIONAL ARTERIOGRAPHY MEDICATIONS: None ANESTHESIA/SEDATION: Moderate (conscious) sedation was employed during this procedure. A total of Versed 2 mg and Fentanyl 45 mcg was administered intravenously. Moderate Sedation Time: 40 minutes. The patient's level of consciousness and vital signs were monitored continuously by radiology nursing throughout the procedure under my direct supervision. CONTRAST:  54mL OMNIPAQUE IOHEXOL 300 MG/ML  SOLN FLUOROSCOPY TIME:  Fluoroscopy Time: 10 minutes 54 seconds (240 mGy). COMPLICATIONS: None immediate. PROCEDURE: Informed consent was obtained from the patient following explanation of the procedure, risks, benefits and alternatives. The patient understands, agrees and consents for the procedure. All questions were addressed. A time out was performed prior to the initiation of the procedure. Maximal barrier sterile technique utilized including caps, mask, sterile gowns, sterile gloves, large sterile drape, hand hygiene, and Betadine prep. The right common femoral artery was interrogated with ultrasound and found to be widely patent. An image was obtained and stored for the medical record. Local anesthesia was attained by infiltration with 1% lidocaine. A small dermatotomy was made. Under real-time sonographic guidance, the vessel was punctured with a 21 gauge micropuncture needle. Using standard technique, the initial micro needle was exchanged over a 0.018 micro wire for a transitional 4 [place] micro sheath. The micro sheath was then exchanged over a 0.035 wire for a 5 French vascular sheath. A C2 cobra catheter was advanced over a Bentson wire into the abdominal aorta. The catheter  was used to select the superior mesenteric artery. A superior mesenteric arteriogram was performed. No evidence of replaced right hepatic or enlarged pancreaticoduodenal arteries. Next, the C2 catheter was used to select the celiac axis. A celiac arteriogram was performed. Conventional hepatic arterial anatomy. There is a focal region of narrowing in the mid aspect of the gastroduodenal artery concerning for a site of vascular injury. Due to the down sloping configuration of the celiac axis, the C2 cobra catheter was exchanged for a Sos Omni selective catheter which was successfully engaged into the celiac artery. A renegade STC microcatheter was then advanced over a Fathom 16 wire into the gastroduodenal artery. Arteriography was performed. This confirms a focal narrowing in the mid gastroduodenal  artery. Coil embolization was then performed using a combination of interlock and Ruby detachable microcoils. The mid and distal gastroduodenal artery were successfully coiled to stasis. Contrast injection into the gastroduodenal stump demonstrates reflux back into the hepatic artery. There is no forward flow. The catheter system was removed. Hemostasis was attained using a Cordis extra arterial vascular plug. IMPRESSION: 1. Focal region of narrowing in the mid aspect of the gastroduodenal artery concerning for region of spasm secondary to irritation from the overlying ulcer bed. This may represent the source of intermittent bleeding. 2. Successful coil embolization of the gastroduodenal artery. Signed, Sterling Big, MD, RPVI [facility] Radiology Electronically Signed   By: Malachy Moan M.D.   On: 08/24/2020 15:45   IR Angiogram Visceral Selective  Result Date: 08/24/2020 INDICATION: 74 year old female with significant upper GI bleeding from a large duodenal ulcer with visible vessel in the ulcer bed. She continued to have bleeding overnight with decreased  hemoglobin this morning. She presents for visceral selective ultrasound and embolization. EXAM: SELECTIVE VISCERAL ARTERIOGRAPHY; IR ULTRASOUND GUIDANCE VASC ACCESS RIGHT; IR EMBO ART VEN HEMORR LYMPH EXTRAV INC GUIDE ROADMAPPING; ADDITIONAL ARTERIOGRAPHY MEDICATIONS: None ANESTHESIA/SEDATION: Moderate (conscious) sedation was employed during this procedure. A total of Versed 2 mg and Fentanyl 45 mcg was administered intravenously. Moderate Sedation Time: 40 minutes. The patient's level of consciousness and vital signs were monitored continuously by radiology nursing throughout the procedure under my direct supervision. CONTRAST:  45mL OMNIPAQUE IOHEXOL 300 MG/ML  SOLN FLUOROSCOPY TIME:  Fluoroscopy Time: 10 minutes 54 seconds (240 mGy). COMPLICATIONS: None immediate. PROCEDURE: Informed consent was obtained from the patient following explanation of the procedure, risks, benefits and alternatives. The patient understands, agrees and consents for the procedure. All questions were addressed. A time out was performed prior to the initiation of the procedure. Maximal barrier sterile technique utilized including caps, mask, sterile gowns, sterile gloves, large sterile drape, hand hygiene, and Betadine prep. The right common femoral artery was interrogated with ultrasound and found to be widely patent. An image was obtained and stored for the medical record. Local anesthesia was attained by infiltration with 1% lidocaine. A small dermatotomy was made. Under real-time sonographic guidance, the vessel was punctured with a 21 gauge micropuncture needle. Using standard technique, the initial micro needle was exchanged over a 0.018 micro wire for a transitional 4 [place] micro sheath. The micro sheath was then exchanged over a 0.035 wire for a 5 French vascular sheath. A C2 cobra catheter was advanced over a Bentson wire into the abdominal aorta. The catheter was used to select the superior mesenteric artery. A superior  mesenteric arteriogram was performed. No evidence of replaced right hepatic or enlarged pancreaticoduodenal arteries. Next, the C2 catheter was used to select the celiac axis. A celiac arteriogram was performed. Conventional hepatic arterial anatomy. There is a focal region of narrowing in the mid aspect of the gastroduodenal artery concerning for a site of vascular injury. Due to the down sloping configuration of the celiac axis, the C2 cobra catheter was exchanged for a Sos Omni selective catheter which was successfully engaged into the celiac artery. A renegade STC microcatheter was then advanced over a Fathom 16 wire into the gastroduodenal artery. Arteriography was performed. This confirms a focal narrowing in the mid gastroduodenal artery. Coil embolization was then performed using a combination of interlock and Ruby detachable microcoils. The mid and distal gastroduodenal artery were successfully coiled to stasis. Contrast injection into the gastroduodenal stump demonstrates  reflux back into the hepatic artery. There is no forward flow. The catheter system was removed. Hemostasis was attained using a Cordis extra arterial vascular plug. IMPRESSION: 1. Focal region of narrowing in the mid aspect of the gastroduodenal artery concerning for region of spasm secondary to irritation from the overlying ulcer bed. This may represent the source of intermittent bleeding. 2. Successful coil embolization of the gastroduodenal artery. Signed, Sterling Big, MD, RPVI [facility] Radiology Electronically Signed   By: Malachy Moan M.D.   On: 08/24/2020 15:45   IR Angiogram Selective Each Additional Vessel  Result Date: 08/24/2020 INDICATION: 74 year old female with significant upper GI bleeding from a large duodenal ulcer with visible vessel in the ulcer bed. She continued to have bleeding overnight with decreased hemoglobin this morning. She presents for  visceral selective ultrasound and embolization. EXAM: SELECTIVE VISCERAL ARTERIOGRAPHY; IR ULTRASOUND GUIDANCE VASC ACCESS RIGHT; IR EMBO ART VEN HEMORR LYMPH EXTRAV INC GUIDE ROADMAPPING; ADDITIONAL ARTERIOGRAPHY MEDICATIONS: None ANESTHESIA/SEDATION: Moderate (conscious) sedation was employed during this procedure. A total of Versed 2 mg and Fentanyl 45 mcg was administered intravenously. Moderate Sedation Time: 40 minutes. The patient's level of consciousness and vital signs were monitored continuously by radiology nursing throughout the procedure under my direct supervision. CONTRAST:  45mL OMNIPAQUE IOHEXOL 300 MG/ML  SOLN FLUOROSCOPY TIME:  Fluoroscopy Time: 10 minutes 54 seconds (240 mGy). COMPLICATIONS: None immediate. PROCEDURE: Informed consent was obtained from the patient following explanation of the procedure, risks, benefits and alternatives. The patient understands, agrees and consents for the procedure. All questions were addressed. A time out was performed prior to the initiation of the procedure. Maximal barrier sterile technique utilized including caps, mask, sterile gowns, sterile gloves, large sterile drape, hand hygiene, and Betadine prep. The right common femoral artery was interrogated with ultrasound and found to be widely patent. An image was obtained and stored for the medical record. Local anesthesia was attained by infiltration with 1% lidocaine. A small dermatotomy was made. Under real-time sonographic guidance, the vessel was punctured with a 21 gauge micropuncture needle. Using standard technique, the initial micro needle was exchanged over a 0.018 micro wire for a transitional 4 [place] micro sheath. The micro sheath was then exchanged over a 0.035 wire for a 5 French vascular sheath. A C2 cobra catheter was advanced over a Bentson wire into the abdominal aorta. The catheter was used to select the superior mesenteric artery. A superior mesenteric arteriogram was performed. No evidence  of replaced right hepatic or enlarged pancreaticoduodenal arteries. Next, the C2 catheter was used to select the celiac axis. A celiac arteriogram was performed. Conventional hepatic arterial anatomy. There is a focal region of narrowing in the mid aspect of the gastroduodenal artery concerning for a site of vascular injury. Due to the down sloping configuration of the celiac axis, the C2 cobra catheter was exchanged for a Sos Omni selective catheter which was successfully engaged into the celiac artery. A renegade STC microcatheter was then advanced over a Fathom 16 wire into the gastroduodenal artery. Arteriography was performed. This confirms a focal narrowing in the mid gastroduodenal artery. Coil embolization was then performed using a combination of interlock and Ruby detachable microcoils. The mid and distal gastroduodenal artery were successfully coiled to stasis. Contrast injection into the gastroduodenal stump demonstrates reflux back into the hepatic artery. There is no forward flow. The catheter system was removed. Hemostasis was attained using a Cordis extra arterial vascular plug. IMPRESSION: 1. Focal region of  narrowing in the mid aspect of the gastroduodenal artery concerning for region of spasm secondary to irritation from the overlying ulcer bed. This may represent the source of intermittent bleeding. 2. Successful coil embolization of the gastroduodenal artery. Signed, Sterling BigHeath K. McCullough, MD, RPVI [facility] Electronically Signed   By: Malachy MoanHeath  McCullough M.D.   On: 08/24/2020 15:45   IR US Guide Vasc Access Right  Result Date: 08/24/2020 INDICATION: 74 year old female with significant upper GI bleeding from a large duodenal ulcer with visible vessel in the ulcer bed. She continued to have bleeding overnight with decreased hemoglobin this morning. She presents for visceral selective ultrasound and embolization. EXAM: SELECTIVE VISCERAL  ARTERIOGRAPHY; IR ULTRASOUND GUIDANCE VASC ACCESS RIGHT; IR EMBO ART VEN HEMORR LYMPH EXTRAV INC GUIDE ROADMAPPING; ADDITIONAL ARTERIOGRAPHY MEDICATIONS: None ANESTHESIA/SEDATION: Moderate (conscious) sedation was employed during this procedure. A total of Versed 2 mg and Fentanyl 45 mcg was administered intravenously. Moderate Sedation Time: 40 minutes. The patient's level of consciousness and vital signs were monitored continuously by radiology nursing throughout the procedure under my direct supervision. CONTRAST:  45mL OMNIPAQUE IOHEXOL 300 MG/ML  SOLN FLUOROSCOPY TIME:  Fluoroscopy Time: 10 minutes 54 seconds (240 mGy). COMPLICATIONS: None immediate. PROCEDURE: Informed consent was obtained from the patient following explanation of the procedure, risks, benefits and alternatives. The patient understands, agrees and consents for the procedure. All questions were addressed. A time out was performed prior to the initiation of the procedure. Maximal barrier sterile technique utilized including caps, mask, sterile gowns, sterile gloves, large sterile drape, hand hygiene, and Betadine prep. The right common femoral artery was interrogated with ultrasound and found to be widely patent. An image was obtained and stored for the medical record. Local anesthesia was attained by infiltration with 1% lidocaine. A small dermatotomy was made. Under real-time sonographic guidance, the vessel was punctured with a 21 gauge micropuncture needle. Using standard technique, the initial micro needle was exchanged over a 0.018 micro wire for a transitional 4 [place] micro sheath. The micro sheath was then exchanged over a 0.035 wire for a 5 French vascular sheath. A C2 cobra catheter was advanced over a Bentson wire into the abdominal aorta. The catheter was used to select the superior mesenteric artery. A superior mesenteric arteriogram was performed. No evidence of replaced right hepatic or enlarged pancreaticoduodenal arteries.  Next, the C2 catheter was used to select the celiac axis. A celiac arteriogram was performed. Conventional hepatic arterial anatomy. There is a focal region of narrowing in the mid aspect of the gastroduodenal artery concerning for a site of vascular injury. Due to the down sloping configuration of the celiac axis, the C2 cobra catheter was exchanged for a Sos Omni selective catheter which was successfully engaged into the celiac artery. A renegade STC microcatheter was then advanced over a Fathom 16 wire into the gastroduodenal artery. Arteriography was performed. This confirms a focal narrowing in the mid gastroduodenal artery. Coil embolization was then performed using a combination of interlock and Ruby detachable microcoils. The mid and distal gastroduodenal artery were successfully coiled to stasis. Contrast injection into the gastroduodenal stump demonstrates reflux back into the hepatic artery. There is no forward flow. The catheter system was removed. Hemostasis was attained using a Cordis extra arterial vascular plug. IMPRESSION: 1. Focal region of narrowing in the mid aspect of the gastroduodenal artery concerning for region of spasm secondary to irritation from the overlying ulcer bed. This may represent the source of intermittent bleeding. 2.  Successful coil embolization of the gastroduodenal artery. Signed, Sterling Big, MD, RPVI [facility] Radiology Electronically Signed   By: Malachy Moan M.D.   On: 08/24/2020 15:45   IR EMBO ART  VEN HEMORR LYMPH EXTRAV  INC GUIDE ROADMAPPING  Result Date: 08/24/2020 INDICATION: 74 year old female with significant upper GI bleeding from a large duodenal ulcer with visible vessel in the ulcer bed. She continued to have bleeding overnight with decreased hemoglobin this morning. She presents for visceral selective ultrasound and embolization. EXAM: SELECTIVE VISCERAL ARTERIOGRAPHY; IR ULTRASOUND GUIDANCE  VASC ACCESS RIGHT; IR EMBO ART VEN HEMORR LYMPH EXTRAV INC GUIDE ROADMAPPING; ADDITIONAL ARTERIOGRAPHY MEDICATIONS: None ANESTHESIA/SEDATION: Moderate (conscious) sedation was employed during this procedure. A total of Versed 2 mg and Fentanyl 45 mcg was administered intravenously. Moderate Sedation Time: 40 minutes. The patient's level of consciousness and vital signs were monitored continuously by radiology nursing throughout the procedure under my direct supervision. CONTRAST:  84mL OMNIPAQUE IOHEXOL 300 MG/ML  SOLN FLUOROSCOPY TIME:  Fluoroscopy Time: 10 minutes 54 seconds (240 mGy). COMPLICATIONS: None immediate. PROCEDURE: Informed consent was obtained from the patient following explanation of the procedure, risks, benefits and alternatives. The patient understands, agrees and consents for the procedure. All questions were addressed. A time out was performed prior to the initiation of the procedure. Maximal barrier sterile technique utilized including caps, mask, sterile gowns, sterile gloves, large sterile drape, hand hygiene, and Betadine prep. The right common femoral artery was interrogated with ultrasound and found to be widely patent. An image was obtained and stored for the medical record. Local anesthesia was attained by infiltration with 1% lidocaine. A small dermatotomy was made. Under real-time sonographic guidance, the vessel was punctured with a 21 gauge micropuncture needle. Using standard technique, the initial micro needle was exchanged over a 0.018 micro wire for a transitional 4 [place] micro sheath. The micro sheath was then exchanged over a 0.035 wire for a 5 French vascular sheath. A C2 cobra catheter was advanced over a Bentson wire into the abdominal aorta. The catheter was used to select the superior mesenteric artery. A superior mesenteric arteriogram was performed. No evidence of replaced right hepatic or enlarged pancreaticoduodenal arteries. Next, the C2 catheter was used to select  the celiac axis. A celiac arteriogram was performed. Conventional hepatic arterial anatomy. There is a focal region of narrowing in the mid aspect of the gastroduodenal artery concerning for a site of vascular injury. Due to the down sloping configuration of the celiac axis, the C2 cobra catheter was exchanged for a Sos Omni selective catheter which was successfully engaged into the celiac artery. A renegade STC microcatheter was then advanced over a Fathom 16 wire into the gastroduodenal artery. Arteriography was performed. This confirms a focal narrowing in the mid gastroduodenal artery. Coil embolization was then performed using a combination of interlock and Ruby detachable microcoils. The mid and distal gastroduodenal artery were successfully coiled to stasis. Contrast injection into the gastroduodenal stump demonstrates reflux back into the hepatic artery. There is no forward flow. The catheter system was removed. Hemostasis was attained using a Cordis extra arterial vascular plug. IMPRESSION: 1. Focal region of narrowing in the mid aspect of the gastroduodenal artery concerning for region of spasm secondary to irritation from the overlying ulcer bed. This may represent the source of intermittent bleeding. 2. Successful coil embolization of the gastroduodenal artery. Signed, Sterling Big, MD, RPVI [facility] Radiology Electronically Signed   By:  Malachy Moan M.D.   On: 08/24/2020 15:45    Medications: I have reviewed the patient's current medications.  Assessment: Large cratered duodenal bulb/first portion of the duodenum ulcer with pigmentation and visible vessel, not amenable for endoscopic therapy, treated with epinephrine injection at periphery of ulcer Denies use of NSAIDs, biopsies negative for H. pylori   Status post coil embolization of gastroduodenal artery yesterday Continues to have maroon-colored stools overnight and dropped  her hemoglobin from 7.5 to 6.2 So far has received a total of 6 units of PRBC, hemoglobin improved to 10.9 Not hypotensive or tachycardic  Plan: Discussed with patient's hospitalist Dr. Frederick Peers, recommend surgical evaluation. If patient continues to have further episodes of bleeding and drop in blood count, she may need surgical intervention.   Kerin Salen, MD 08/25/2020, 11:18 AM

## 2020-08-25 NOTE — Progress Notes (Signed)
°  Echocardiogram 2D Echocardiogram has been performed.  Celene Skeen 08/25/2020, 2:21 PM

## 2020-08-25 NOTE — Progress Notes (Signed)
Pt requesting pain med stronger than Tylenol for pain. Paged Dr. Katherina Right

## 2020-08-25 NOTE — Consult Note (Signed)
[facility] Surgery Consult Note  Elizabeth Haynes [birth year]  [id number].    Requesting MD: Girguis Chief Complaint/Reason for Consult: UGIB HPI:  Patient is a 74 year old female admitted to [facility] 9/26 with weakness, syncopal episode and melenotic stools. Endoscopy done 9/27 and revealed non-bleeding gastric ulcers and 3 cm non-bleeding duodenal ulcer. Duodenal ulcer was injected with epinephrine. Patient continued to have melenotic stools and decreased hemoglobin, IR was consulted for GDA embolization which was done 9/28. Patient this AM reports LUQ pain and abdominal cramping. She denies nausea. She had another melenotic stool last night. Received 2 units PRBC overnight and hgb up to 10.9 this AM. PMH otherwise significant for recent fall with back injury, osteoporosis/osteopenia. No blood thinning medications. Patient denies alcohol, tobacco, and illicit drug use. She denies heavy NSAID use or recent steroids.   ROS: Review of Systems  Constitutional: Negative for chills and fever.  Respiratory: Negative for shortness of breath and wheezing.   Cardiovascular: Negative for chest pain and palpitations.  Gastrointestinal: Positive for abdominal pain, diarrhea and melena. Negative for constipation, nausea and vomiting.  Genitourinary: Negative for dysuria, frequency and urgency.  Neurological: Positive for weakness.  All other systems reviewed and are negative.   Family History  Problem Relation Age of Onset  . Dementia Mother   . Hip fracture Mother        x 2  . Alzheimer's disease Mother   . Hypertension Mother   . Heart disease Mother        pacemaker  . Cancer Father        thyroid,lung,prostate  . Diabetes Other   . Breast cancer Maternal Grandmother   . Heart attack Maternal Grandfather     Past Medical History:  Diagnosis Date  . Osteopenia   . Osteoporosis 09/2016  . Rosacea   . Symptomatic menopausal or female climacteric states     Past Surgical History:   Procedure Laterality Date  . BIOPSY  08/23/2020   Procedure: BIOPSY;  Surgeon: Kerin Salen, MD;  Location: [facility];  Service: Gastroenterology;;  . ESOPHAGOGASTRODUODENOSCOPY (EGD) WITH PROPOFOL Left 08/23/2020   Procedure: ESOPHAGOGASTRODUODENOSCOPY (EGD) WITH PROPOFOL;  Surgeon: Kerin Salen, MD;  Location: [facility];  Service: Gastroenterology;  Laterality: Left;  . IR ANGIOGRAM SELECTIVE EACH ADDITIONAL VESSEL  08/24/2020  . IR ANGIOGRAM VISCERAL SELECTIVE  08/24/2020  . IR ANGIOGRAM VISCERAL SELECTIVE  08/24/2020  . IR EMBO ART  VEN HEMORR LYMPH EXTRAV  INC GUIDE ROADMAPPING  08/24/2020  . IR US GUIDE VASC ACCESS RIGHT  08/24/2020  . SCLEROTHERAPY  08/23/2020   Procedure: Susa Day;  Surgeon: Kerin Salen, MD;  Location: [facility];  Service: Gastroenterology;;  . TONSILLECTOMY AND ADENOIDECTOMY  age 9  . TUBAL LIGATION      Social History:  reports that she has never smoked. She has never used smokeless tobacco. She reports that she does not drink alcohol and does not use drugs.  Allergies:  Allergies  Allergen Reactions  . Tramadol Other (See Comments)    confusion  . Bactrim [Sulfamethoxazole-Trimethoprim] Hives    Medications Prior to Admission  Medication Sig Dispense Refill  . Azelaic Acid (FINACEA EX) Apply topically.    Marland Kitchen b complex vitamins capsule Take 1 capsule by mouth daily.    . calcium carbonate 1250 MG capsule Take 1,250 mg by mouth 2 (two) times daily with a meal.    . cholecalciferol (VITAMIN D) 1000 UNITS tablet Take 1,000 Units by mouth daily.    Marland Kitchen  ciprofloxacin (CIPRO) 500 MG tablet Take 1 tablet (500 mg total) by mouth 2 (two) times daily. 20 tablet 0  . HYDROcodone-acetaminophen (NORCO/VICODIN) 5-325 MG tablet Take 1 tablet by mouth 4 (four) times daily as needed for pain.    . Multiple Vitamins-Minerals (MULTIVITAMIN WITH MINERALS) tablet Take 1 tablet by mouth daily.    Marland Kitchen olopatadine (PATADAY) 0.1 % ophthalmic solution Place 1 drop into both eyes  daily as needed for allergies.    . vitamin E 1000 UNIT capsule Take 1,000 Units by mouth daily.      Blood pressure 125/69, pulse 72, temperature 97.8 F (36.6 C), temperature source Oral, resp. rate 17, height 5\' 2"  (1.575 m), weight 43.1 kg, SpO2 99 %. Physical Exam:  General: pleasant, WD, WN female who is laying in bed in NAD HEENT: Sclera are noninjected.  PERRL.  Ears and nose without any masses or lesions.  Mouth is pink and moist Heart: regular, rate, and rhythm.  Normal s1,s2. No obvious murmurs, gallops, or rubs noted.  Palpable radial and pedal pulses bilaterally Lungs: CTAB, no wheezes, rhonchi, or rales noted.  Respiratory effort nonlabored Abd: soft, mild ttp in LUQ, ND, +BS, no masses, hernias, or organomegaly MS: all 4 extremities are symmetrical with no cyanosis, clubbing, or edema. Skin: warm and dry with no masses, lesions, or rashes Neuro: Cranial nerves 2-12 grossly intact, sensation grossly intact  Psych: A&Ox3 with an appropriate affect.   Results for orders placed or performed during the hospital encounter of 08/22/20 (from the past 48 hour(s))  Hemoglobin and hematocrit, blood     Status: Abnormal   Collection Time: 08/23/20 10:27 AM  Result Value Ref Range   Hemoglobin 8.4 (L) 12.0 - 15.0 g/dL   HCT 16.1 (L) 36 - 46 %    Comment: Performed at [facility], [address] [place], [place] [address]  Urinalysis, Routine w reflex microscopic Urine, Random     Status: Abnormal   Collection Time: 08/23/20 11:07 AM  Result Value Ref Range   Color, Urine YELLOW YELLOW   APPearance HAZY (A) CLEAR   Specific Gravity, Urine 1.023 1.005 - 1.030   pH 5.0 5.0 - 8.0   Glucose, UA NEGATIVE NEGATIVE mg/dL   Hgb urine dipstick LARGE (A) NEGATIVE   Bilirubin Urine NEGATIVE NEGATIVE   Ketones, ur 5 (A) NEGATIVE mg/dL   Protein, ur NEGATIVE NEGATIVE mg/dL   Nitrite NEGATIVE NEGATIVE   Leukocytes,Ua NEGATIVE NEGATIVE   RBC / HPF 6-10 0 - 5 RBC/hpf    WBC, UA 6-10 0 - 5 WBC/hpf   Bacteria, UA RARE (A) NONE SEEN   Squamous Epithelial / LPF 0-5 0 - 5   Mucus PRESENT    Ca Oxalate Crys, UA PRESENT     Comment: Performed at [facility], [address] [place], [place] [address]  Urine culture     Status: Abnormal   Collection Time: 08/23/20 11:08 AM   Specimen: Urine, Random  Result Value Ref Range   Specimen Description      URINE, RANDOM Performed at [facility], [address] [place], [place] [address]    Special Requests      NONE Performed at [facility], [address], [place], [place] [address]    Culture (A)     <10,000 COLONIES/mL INSIGNIFICANT GROWTH Performed at [facility], [address], [place], [place] [address]    Report Status 08/24/2020 FINAL  Surgical pathology     Status: None   Collection Time: 08/23/20  1:56 PM  Result Value Ref Range   SURGICAL PATHOLOGY      SURGICAL PATHOLOGY CASE: [id number] PATIENT: Lottie Dawson Surgical Pathology Report     Clinical History: Melena, anemia (crm)   FINAL MICROSCOPIC DIAGNOSIS:  A. STOMACH, ANTRUM, BIOPSY: -  Mild chronic gastritis with reactive changes -  No H. pylori or intestinal metaplasia identified -  See comment  COMMENT:  Warthin-Starry stain is NEGATIVE for organisms morphologically consistent with Helicobacter pylori.  GROSS DESCRIPTION:  Received in formalin is a tan, soft tissue fragment that is submitted in toto.  Size: 0.6 cm, 1 block submitted.   Lovey Newcomer 08/24/2020)    Final Diagnosis performed by Manning Charity, MD.   Electronically signed 08/24/2020 Technical component performed at [facility], [address] [place], [place] [address].  Professional component performed at [employer], [address] [place], [place] [address].  Immunohistochemistry Technical component (if applicable) was performed at [facility] [address], [place], [place] [address].   IMMUNOHISTOCHEMISTRY DISCLAIMER (if applicable): Some of these immunohistochemical stains may have been developed and the performance characteristics determine by [facility]. Some may not have been cleared or approved by the U.S. Food and Drug Administration. The FDA has determined that such clearance or approval is not necessary. This test is used for clinical purposes. It should not be regarded as investigational or for research. This laboratory is certified under the Clinical Laboratory Improvement Amendments of 1988 (CLIA-88) as qualified to perform high complexity clinical laboratory testing.  The controls stained appropriately.   CBC     Status: Abnormal   Collection Time: 08/23/20  5:05 PM  Result Value Ref Range   WBC 12.6 (H) 4.0 - 10.5 K/uL   RBC 2.11 (L) 3.87 - 5.11 MIL/uL   Hemoglobin 6.6 (LL) 12.0 - 15.0 g/dL    Comment: REPEATED TO VERIFY THIS CRITICAL RESULT HAS VERIFIED AND BEEN CALLED TO N.ARAYA BY NATHAN THOMPSON ON 09 27 2021 AT 1738, AND HAS BEEN READ BACK. CRITICAL RESULT VERIFIED    HCT 19.4 (L) 36 - 46 %   MCV 91.9 80.0 - 100.0 fL   MCH 31.3 26.0 - 34.0 pg   MCHC 34.0 30.0 - 36.0 g/dL   RDW 69.4 85.4 - 62.7 %   Platelets 292 150 - 400 K/uL   nRBC 0.0 0.0 - 0.2 %    Comment: Performed at [facility], [address] [place], [place] [address]  TSH     Status: None   Collection Time: 08/23/20  5:05 PM  Result Value Ref Range   TSH 0.628 0.350 - 4.500 uIU/mL    Comment: Performed by a 3rd Generation assay with a functional sensitivity of <=0.01 uIU/mL. Performed at [facility], [address], [place], [place] [address]   Ferritin     Status: None   Collection Time: 08/23/20  5:05 PM  Result Value Ref Range   Ferritin 60 11 - 307 ng/mL    Comment: Performed at [facility], [address], [place], [place] [address]   Prepare RBC (crossmatch)     Status: None   Collection Time: 08/23/20  5:52 PM  Result Value Ref Range   Order Confirmation      ORDER PROCESSED BY BLOOD BANK Performed at [facility], [address]  [place], [place] [address]   Basic metabolic panel     Status: Abnormal   Collection Time: 08/24/20  4:37 AM  Result Value Ref Range   Sodium 132 (L) 135 - 145 mmol/L   Potassium 3.6 3.5 - 5.1 mmol/L   Chloride 100 98 - 111 mmol/L   CO2 26 22 - 32 mmol/L   Glucose, Bld 120 (H) 70 - 99 mg/dL    Comment: Glucose reference range applies only to samples taken after fasting for at least 8 hours.   BUN 32 (H) 8 - 23 mg/dL   Creatinine, Ser 3.080.45 0.44 - 1.00 mg/dL   Calcium 7.8 (L) 8.9 - 10.3 mg/dL   GFR calc non Af Amer >60 >60 mL/min   GFR calc Af Amer >60 >60 mL/min   Anion gap 6 5 - 15    Comment: Performed at [facility], [address] [place], [place] [address]  CBC with Differential/Platelet     Status: Abnormal   Collection Time: 08/24/20  4:37 AM  Result Value Ref Range   WBC 13.8 (H) 4.0 - 10.5 K/uL   RBC 2.90 (L) 3.87 - 5.11 MIL/uL   Hemoglobin 9.4 (L) 12.0 - 15.0 g/dL    Comment: REPEATED TO VERIFY   HCT 26.5 (L) 36 - 46 %   MCV 91.4 80.0 - 100.0 fL   MCH 32.4 26.0 - 34.0 pg   MCHC 35.5 30.0 - 36.0 g/dL   RDW 69.614.0 29.511.5 - 28.415.5 %   Platelets 214 150 - 400 K/uL   nRBC 0.2 0.0 - 0.2 %   Neutrophils Relative % 73 %   Neutro Abs 10.1 (H) 1.7 - 7.7 K/uL   Lymphocytes Relative 15 %   Lymphs Abs 2.1 0.7 - 4.0 K/uL   Monocytes Relative 8 %   Monocytes Absolute 1.1 (H) 0 - 1 K/uL   Eosinophils Relative 2 %   Eosinophils Absolute 0.3 0 - 0 K/uL   Basophils Relative 0 %   Basophils Absolute 0.0 0 - 0 K/uL   Immature Granulocytes 2 %   Abs Immature Granulocytes 0.21 (H) 0.00 - 0.07 K/uL    Comment: Performed at [facility], [address] [place], [place] [address]  Protime-INR     Status: None   Collection Time:  08/24/20  4:37 AM  Result Value Ref Range   Prothrombin Time 14.4 11.4 - 15.2 seconds   INR 1.2 0.8 - 1.2    Comment: (NOTE) INR goal varies based on device and disease states. Performed at [facility], [address] [place], [place] [address]   CBC     Status: Abnormal   Collection Time: 08/24/20  2:12 PM  Result Value Ref Range   WBC 13.4 (H) 4.0 - 10.5 K/uL   RBC 2.28 (L) 3.87 - 5.11 MIL/uL   Hemoglobin 7.5 (L) 12.0 - 15.0 g/dL   HCT 25.321.3 (L) 36 - 46 %   MCV 93.4 80.0 - 100.0 fL   MCH 32.9 26.0 - 34.0 pg   MCHC 35.2 30.0 - 36.0 g/dL   RDW 66.414.4 40.311.5 - 47.415.5 %   Platelets 213 150 - 400 K/uL   nRBC 0.2 0.0 - 0.2 %    Comment: Performed at [facility], [address] [place], [place] [address]  CBC     Status: Abnormal   Collection Time: 08/24/20  9:01 PM  Result Value Ref Range   WBC 13.5 (H)  4.0 - 10.5 K/uL   RBC 1.93 (L) 3.87 - 5.11 MIL/uL   Hemoglobin 6.2 (LL) 12.0 - 15.0 g/dL    Comment: This critical result has verified and been called to [facility]. RN by Jani Gravel on 09 28 2021 at 2117, and has been read back. CRITICAL RESULT VERIFIED   HCT 18.2 (L) 36 - 46 %   MCV 94.3 80.0 - 100.0 fL   MCH 32.1 26.0 - 34.0 pg   MCHC 34.1 30.0 - 36.0 g/dL   RDW 16.1 09.6 - 04.5 %   Platelets 187 150 - 400 K/uL   nRBC 0.1 0.0 - 0.2 %    Comment: Performed at [facility], [address], [place], [place] [address]  Prepare RBC (crossmatch)     Status: None   Collection Time: 08/24/20  9:42 PM  Result Value Ref Range   Order Confirmation      ORDER PROCESSED BY BLOOD BANK Performed at [facility] [address], [place], [place] [address]   Basic metabolic panel     Status: Abnormal   Collection Time: 08/25/20  8:25 AM  Result Value Ref Range   Sodium 129 (L) 135 - 145 mmol/L   Potassium 3.2 (L) 3.5 - 5.1 mmol/L   Chloride 97 (L) 98 - 111 mmol/L   CO2 25 22 - 32 mmol/L   Glucose, Bld 127  (H) 70 - 99 mg/dL    Comment: Glucose reference range applies only to samples taken after fasting for at least 8 hours.   BUN 27 (H) 8 - 23 mg/dL   Creatinine, Ser 8.29 0.44 - 1.00 mg/dL   Calcium 7.8 (L) 8.9 - 10.3 mg/dL   GFR calc non Af Amer >60 >60 mL/min   GFR calc Af Amer >60 >60 mL/min   Anion gap 7 5 - 15    Comment: Performed at [facility], [address] [place], [place] [address]  CBC with Differential/Platelet     Status: Abnormal   Collection Time: 08/25/20  8:25 AM  Result Value Ref Range   WBC 13.3 (H) 4.0 - 10.5 K/uL   RBC 3.43 (L) 3.87 - 5.11 MIL/uL   Hemoglobin 10.9 (L) 12.0 - 15.0 g/dL    Comment: REPEATED TO VERIFY POST TRANSFUSION SPECIMEN DELTA CHECK NOTED    HCT 30.9 (L) 36 - 46 %   MCV 90.1 80.0 - 100.0 fL   MCH 31.8 26.0 - 34.0 pg   MCHC 35.3 30.0 - 36.0 g/dL   RDW 08.6 (H) 57.8 - 46.9 %   Platelets 189 150 - 400 K/uL   nRBC 0.0 0.0 - 0.2 %   Neutrophils Relative % 74 %   Neutro Abs 9.9 (H) 1.7 - 7.7 K/uL   Lymphocytes Relative 12 %   Lymphs Abs 1.6 0.7 - 4.0 K/uL   Monocytes Relative 9 %   Monocytes Absolute 1.2 (H) 0 - 1 K/uL   Eosinophils Relative 3 %   Eosinophils Absolute 0.4 0 - 0 K/uL   Basophils Relative 0 %   Basophils Absolute 0.0 0 - 0 K/uL   Immature Granulocytes 2 %   Abs Immature Granulocytes 0.24 (H) 0.00 - 0.07 K/uL    Comment: Performed at [facility], [address] [place], [place] [address]  Magnesium     Status: None   Collection Time: 08/25/20  8:25 AM  Result Value Ref Range   Magnesium 2.0 1.7 - 2.4  mg/dL    Comment: Performed at [facility], [address] [place], [place] [address]   IR Angiogram Visceral Selective  Result Date: 08/24/2020 INDICATION: 74 year old female with significant upper GI bleeding from a large duodenal ulcer with visible vessel in the ulcer bed. She continued to have bleeding overnight with decreased hemoglobin this morning. She  presents for visceral selective ultrasound and embolization. EXAM: SELECTIVE VISCERAL ARTERIOGRAPHY; IR ULTRASOUND GUIDANCE VASC ACCESS RIGHT; IR EMBO ART VEN HEMORR LYMPH EXTRAV INC GUIDE ROADMAPPING; ADDITIONAL ARTERIOGRAPHY MEDICATIONS: None ANESTHESIA/SEDATION: Moderate (conscious) sedation was employed during this procedure. A total of Versed 2 mg and Fentanyl 45 mcg was administered intravenously. Moderate Sedation Time: 40 minutes. The patient's level of consciousness and vital signs were monitored continuously by radiology nursing throughout the procedure under my direct supervision. CONTRAST:  45mL OMNIPAQUE IOHEXOL 300 MG/ML  SOLN FLUOROSCOPY TIME:  Fluoroscopy Time: 10 minutes 54 seconds (240 mGy). COMPLICATIONS: None immediate. PROCEDURE: Informed consent was obtained from the patient following explanation of the procedure, risks, benefits and alternatives. The patient understands, agrees and consents for the procedure. All questions were addressed. A time out was performed prior to the initiation of the procedure. Maximal barrier sterile technique utilized including caps, mask, sterile gowns, sterile gloves, large sterile drape, hand hygiene, and Betadine prep. The right common femoral artery was interrogated with ultrasound and found to be widely patent. An image was obtained and stored for the medical record. Local anesthesia was attained by infiltration with 1% lidocaine. A small dermatotomy was made. Under real-time sonographic guidance, the vessel was punctured with a 21 gauge micropuncture needle. Using standard technique, the initial micro needle was exchanged over a 0.018 micro wire for a transitional 4 [place] micro sheath. The micro sheath was then exchanged over a 0.035 wire for a 5 French vascular sheath. A C2 cobra catheter was advanced over a Bentson wire into the abdominal aorta. The catheter was used to select the superior mesenteric artery. A superior mesenteric arteriogram was  performed. No evidence of replaced right hepatic or enlarged pancreaticoduodenal arteries. Next, the C2 catheter was used to select the celiac axis. A celiac arteriogram was performed. Conventional hepatic arterial anatomy. There is a focal region of narrowing in the mid aspect of the gastroduodenal artery concerning for a site of vascular injury. Due to the down sloping configuration of the celiac axis, the C2 cobra catheter was exchanged for a Sos Omni selective catheter which was successfully engaged into the celiac artery. A renegade STC microcatheter was then advanced over a Fathom 16 wire into the gastroduodenal artery. Arteriography was performed. This confirms a focal narrowing in the mid gastroduodenal artery. Coil embolization was then performed using a combination of interlock and Ruby detachable microcoils. The mid and distal gastroduodenal artery were successfully coiled to stasis. Contrast injection into the gastroduodenal stump demonstrates reflux back into the hepatic artery. There is no forward flow. The catheter system was removed. Hemostasis was attained using a Cordis extra arterial vascular plug. IMPRESSION: 1. Focal region of narrowing in the mid aspect of the gastroduodenal artery concerning for region of spasm secondary to irritation from the overlying ulcer bed. This may represent the source of intermittent bleeding. 2. Successful coil embolization of the gastroduodenal artery. Signed, Sterling Big, MD, RPVI [facility] Radiology Electronically Signed   By: Malachy Moan M.D.   On: 08/24/2020 15:45   IR Angiogram Visceral Selective  Result Date: 08/24/2020 INDICATION: 74 year old female with significant  upper GI bleeding from a large duodenal ulcer with visible vessel in the ulcer bed. She continued to have bleeding overnight with decreased hemoglobin this morning. She presents for visceral selective ultrasound and embolization.  EXAM: SELECTIVE VISCERAL ARTERIOGRAPHY; IR ULTRASOUND GUIDANCE VASC ACCESS RIGHT; IR EMBO ART VEN HEMORR LYMPH EXTRAV INC GUIDE ROADMAPPING; ADDITIONAL ARTERIOGRAPHY MEDICATIONS: None ANESTHESIA/SEDATION: Moderate (conscious) sedation was employed during this procedure. A total of Versed 2 mg and Fentanyl 45 mcg was administered intravenously. Moderate Sedation Time: 40 minutes. The patient's level of consciousness and vital signs were monitored continuously by radiology nursing throughout the procedure under my direct supervision. CONTRAST:  45mL OMNIPAQUE IOHEXOL 300 MG/ML  SOLN FLUOROSCOPY TIME:  Fluoroscopy Time: 10 minutes 54 seconds (240 mGy). COMPLICATIONS: None immediate. PROCEDURE: Informed consent was obtained from the patient following explanation of the procedure, risks, benefits and alternatives. The patient understands, agrees and consents for the procedure. All questions were addressed. A time out was performed prior to the initiation of the procedure. Maximal barrier sterile technique utilized including caps, mask, sterile gowns, sterile gloves, large sterile drape, hand hygiene, and Betadine prep. The right common femoral artery was interrogated with ultrasound and found to be widely patent. An image was obtained and stored for the medical record. Local anesthesia was attained by infiltration with 1% lidocaine. A small dermatotomy was made. Under real-time sonographic guidance, the vessel was punctured with a 21 gauge micropuncture needle. Using standard technique, the initial micro needle was exchanged over a 0.018 micro wire for a transitional 4 [place] micro sheath. The micro sheath was then exchanged over a 0.035 wire for a 5 French vascular sheath. A C2 cobra catheter was advanced over a Bentson wire into the abdominal aorta. The catheter was used to select the superior mesenteric artery. A superior mesenteric arteriogram was performed. No evidence of replaced right hepatic or enlarged  pancreaticoduodenal arteries. Next, the C2 catheter was used to select the celiac axis. A celiac arteriogram was performed. Conventional hepatic arterial anatomy. There is a focal region of narrowing in the mid aspect of the gastroduodenal artery concerning for a site of vascular injury. Due to the down sloping configuration of the celiac axis, the C2 cobra catheter was exchanged for a Sos Omni selective catheter which was successfully engaged into the celiac artery. A renegade STC microcatheter was then advanced over a Fathom 16 wire into the gastroduodenal artery. Arteriography was performed. This confirms a focal narrowing in the mid gastroduodenal artery. Coil embolization was then performed using a combination of interlock and Ruby detachable microcoils. The mid and distal gastroduodenal artery were successfully coiled to stasis. Contrast injection into the gastroduodenal stump demonstrates reflux back into the hepatic artery. There is no forward flow. The catheter system was removed. Hemostasis was attained using a Cordis extra arterial vascular plug. IMPRESSION: 1. Focal region of narrowing in the mid aspect of the gastroduodenal artery concerning for region of spasm secondary to irritation from the overlying ulcer bed. This may represent the source of intermittent bleeding. 2. Successful coil embolization of the gastroduodenal artery. Signed, Sterling Big, MD, RPVI [facility] Radiology Electronically Signed   By: Malachy Moan M.D.   On: 08/24/2020 15:45   IR Angiogram Selective Each Additional Vessel  Result Date: 08/24/2020 INDICATION: 74 year old female with significant upper GI bleeding from a large duodenal ulcer with visible vessel in the ulcer bed. She continued to have bleeding overnight with decreased hemoglobin this morning. She presents for visceral selective  ultrasound and embolization. EXAM: SELECTIVE VISCERAL ARTERIOGRAPHY; IR  ULTRASOUND GUIDANCE VASC ACCESS RIGHT; IR EMBO ART VEN HEMORR LYMPH EXTRAV INC GUIDE ROADMAPPING; ADDITIONAL ARTERIOGRAPHY MEDICATIONS: None ANESTHESIA/SEDATION: Moderate (conscious) sedation was employed during this procedure. A total of Versed 2 mg and Fentanyl 45 mcg was administered intravenously. Moderate Sedation Time: 40 minutes. The patient's level of consciousness and vital signs were monitored continuously by radiology nursing throughout the procedure under my direct supervision. CONTRAST:  45mL OMNIPAQUE IOHEXOL 300 MG/ML  SOLN FLUOROSCOPY TIME:  Fluoroscopy Time: 10 minutes 54 seconds (240 mGy). COMPLICATIONS: None immediate. PROCEDURE: Informed consent was obtained from the patient following explanation of the procedure, risks, benefits and alternatives. The patient understands, agrees and consents for the procedure. All questions were addressed. A time out was performed prior to the initiation of the procedure. Maximal barrier sterile technique utilized including caps, mask, sterile gowns, sterile gloves, large sterile drape, hand hygiene, and Betadine prep. The right common femoral artery was interrogated with ultrasound and found to be widely patent. An image was obtained and stored for the medical record. Local anesthesia was attained by infiltration with 1% lidocaine. A small dermatotomy was made. Under real-time sonographic guidance, the vessel was punctured with a 21 gauge micropuncture needle. Using standard technique, the initial micro needle was exchanged over a 0.018 micro wire for a transitional 4 [place] micro sheath. The micro sheath was then exchanged over a 0.035 wire for a 5 French vascular sheath. A C2 cobra catheter was advanced over a Bentson wire into the abdominal aorta. The catheter was used to select the superior mesenteric artery. A superior mesenteric arteriogram was performed. No evidence of replaced right hepatic or enlarged pancreaticoduodenal arteries. Next, the C2 catheter  was used to select the celiac axis. A celiac arteriogram was performed. Conventional hepatic arterial anatomy. There is a focal region of narrowing in the mid aspect of the gastroduodenal artery concerning for a site of vascular injury. Due to the down sloping configuration of the celiac axis, the C2 cobra catheter was exchanged for a Sos Omni selective catheter which was successfully engaged into the celiac artery. A renegade STC microcatheter was then advanced over a Fathom 16 wire into the gastroduodenal artery. Arteriography was performed. This confirms a focal narrowing in the mid gastroduodenal artery. Coil embolization was then performed using a combination of interlock and Ruby detachable microcoils. The mid and distal gastroduodenal artery were successfully coiled to stasis. Contrast injection into the gastroduodenal stump demonstrates reflux back into the hepatic artery. There is no forward flow. The catheter system was removed. Hemostasis was attained using a Cordis extra arterial vascular plug. IMPRESSION: 1. Focal region of narrowing in the mid aspect of the gastroduodenal artery concerning for region of spasm secondary to irritation from the overlying ulcer bed. This may represent the source of intermittent bleeding. 2. Successful coil embolization of the gastroduodenal artery. Signed, Sterling Big, MD, RPVI [facility] Radiology Electronically Signed   By: Malachy Moan M.D.   On: 08/24/2020 15:45   IR US Guide Vasc Access Right  Result Date: 08/24/2020 INDICATION: 74 year old female with significant upper GI bleeding from a large duodenal ulcer with visible vessel in the ulcer bed. She continued to have bleeding overnight with decreased hemoglobin this morning. She presents for visceral selective ultrasound and embolization. EXAM: SELECTIVE VISCERAL ARTERIOGRAPHY; IR ULTRASOUND GUIDANCE VASC ACCESS RIGHT; IR EMBO ART VEN HEMORR LYMPH  EXTRAV INC GUIDE ROADMAPPING; ADDITIONAL ARTERIOGRAPHY MEDICATIONS: None ANESTHESIA/SEDATION: Moderate (conscious)  sedation was employed during this procedure. A total of Versed 2 mg and Fentanyl 45 mcg was administered intravenously. Moderate Sedation Time: 40 minutes. The patient's level of consciousness and vital signs were monitored continuously by radiology nursing throughout the procedure under my direct supervision. CONTRAST:  83mL OMNIPAQUE IOHEXOL 300 MG/ML  SOLN FLUOROSCOPY TIME:  Fluoroscopy Time: 10 minutes 54 seconds (240 mGy). COMPLICATIONS: None immediate. PROCEDURE: Informed consent was obtained from the patient following explanation of the procedure, risks, benefits and alternatives. The patient understands, agrees and consents for the procedure. All questions were addressed. A time out was performed prior to the initiation of the procedure. Maximal barrier sterile technique utilized including caps, mask, sterile gowns, sterile gloves, large sterile drape, hand hygiene, and Betadine prep. The right common femoral artery was interrogated with ultrasound and found to be widely patent. An image was obtained and stored for the medical record. Local anesthesia was attained by infiltration with 1% lidocaine. A small dermatotomy was made. Under real-time sonographic guidance, the vessel was punctured with a 21 gauge micropuncture needle. Using standard technique, the initial micro needle was exchanged over a 0.018 micro wire for a transitional 4 [place] micro sheath. The micro sheath was then exchanged over a 0.035 wire for a 5 French vascular sheath. A C2 cobra catheter was advanced over a Bentson wire into the abdominal aorta. The catheter was used to select the superior mesenteric artery. A superior mesenteric arteriogram was performed. No evidence of replaced right hepatic or enlarged pancreaticoduodenal arteries. Next, the C2 catheter was used to select the celiac axis. A celiac arteriogram was  performed. Conventional hepatic arterial anatomy. There is a focal region of narrowing in the mid aspect of the gastroduodenal artery concerning for a site of vascular injury. Due to the down sloping configuration of the celiac axis, the C2 cobra catheter was exchanged for a Sos Omni selective catheter which was successfully engaged into the celiac artery. A renegade STC microcatheter was then advanced over a Fathom 16 wire into the gastroduodenal artery. Arteriography was performed. This confirms a focal narrowing in the mid gastroduodenal artery. Coil embolization was then performed using a combination of interlock and Ruby detachable microcoils. The mid and distal gastroduodenal artery were successfully coiled to stasis. Contrast injection into the gastroduodenal stump demonstrates reflux back into the hepatic artery. There is no forward flow. The catheter system was removed. Hemostasis was attained using a Cordis extra arterial vascular plug. IMPRESSION: 1. Focal region of narrowing in the mid aspect of the gastroduodenal artery concerning for region of spasm secondary to irritation from the overlying ulcer bed. This may represent the source of intermittent bleeding. 2. Successful coil embolization of the gastroduodenal artery. Signed, Sterling Big, MD, RPVI [facility] Radiology Electronically Signed   By: Malachy Moan M.D.   On: 08/24/2020 15:45   IR EMBO ART  VEN HEMORR LYMPH EXTRAV  INC GUIDE ROADMAPPING  Result Date: 08/24/2020 INDICATION: 74 year old female with significant upper GI bleeding from a large duodenal ulcer with visible vessel in the ulcer bed. She continued to have bleeding overnight with decreased hemoglobin this morning. She presents for visceral selective ultrasound and embolization. EXAM: SELECTIVE VISCERAL ARTERIOGRAPHY; IR ULTRASOUND GUIDANCE VASC ACCESS RIGHT; IR EMBO ART VEN HEMORR LYMPH EXTRAV INC GUIDE ROADMAPPING;  ADDITIONAL ARTERIOGRAPHY MEDICATIONS: None ANESTHESIA/SEDATION: Moderate (conscious) sedation was employed during this procedure. A total of Versed 2 mg and Fentanyl 45 mcg was administered intravenously. Moderate Sedation Time: 40 minutes. The  patient's level of consciousness and vital signs were monitored continuously by radiology nursing throughout the procedure under my direct supervision. CONTRAST:  38mL OMNIPAQUE IOHEXOL 300 MG/ML  SOLN FLUOROSCOPY TIME:  Fluoroscopy Time: 10 minutes 54 seconds (240 mGy). COMPLICATIONS: None immediate. PROCEDURE: Informed consent was obtained from the patient following explanation of the procedure, risks, benefits and alternatives. The patient understands, agrees and consents for the procedure. All questions were addressed. A time out was performed prior to the initiation of the procedure. Maximal barrier sterile technique utilized including caps, mask, sterile gowns, sterile gloves, large sterile drape, hand hygiene, and Betadine prep. The right common femoral artery was interrogated with ultrasound and found to be widely patent. An image was obtained and stored for the medical record. Local anesthesia was attained by infiltration with 1% lidocaine. A small dermatotomy was made. Under real-time sonographic guidance, the vessel was punctured with a 21 gauge micropuncture needle. Using standard technique, the initial micro needle was exchanged over a 0.018 micro wire for a transitional 4 [place] micro sheath. The micro sheath was then exchanged over a 0.035 wire for a 5 French vascular sheath. A C2 cobra catheter was advanced over a Bentson wire into the abdominal aorta. The catheter was used to select the superior mesenteric artery. A superior mesenteric arteriogram was performed. No evidence of replaced right hepatic or enlarged pancreaticoduodenal arteries. Next, the C2 catheter was used to select the celiac axis. A celiac arteriogram was performed. Conventional hepatic  arterial anatomy. There is a focal region of narrowing in the mid aspect of the gastroduodenal artery concerning for a site of vascular injury. Due to the down sloping configuration of the celiac axis, the C2 cobra catheter was exchanged for a Sos Omni selective catheter which was successfully engaged into the celiac artery. A renegade STC microcatheter was then advanced over a Fathom 16 wire into the gastroduodenal artery. Arteriography was performed. This confirms a focal narrowing in the mid gastroduodenal artery. Coil embolization was then performed using a combination of interlock and Ruby detachable microcoils. The mid and distal gastroduodenal artery were successfully coiled to stasis. Contrast injection into the gastroduodenal stump demonstrates reflux back into the hepatic artery. There is no forward flow. The catheter system was removed. Hemostasis was attained using a Cordis extra arterial vascular plug. IMPRESSION: 1. Focal region of narrowing in the mid aspect of the gastroduodenal artery concerning for region of spasm secondary to irritation from the overlying ulcer bed. This may represent the source of intermittent bleeding. 2. Successful coil embolization of the gastroduodenal artery. Signed, Sterling Big, MD, RPVI [facility] Radiology Electronically Signed   By: Malachy Moan M.D.   On: 08/24/2020 15:45      Assessment/Plan Rosacea  Osteopenia/Osteoporosis Recent fall with back injury ABL anemia secondary to GI bleed with syncope  UGIB secondary to duodenal ulcer - s/p EGD with epinephrine injection 9/27 - bx negative for H. Pylori  - s/p IR coil embolization of GDA 9/28 - hgb 10.9 today s/p 2 units PRBC overnight  - patient on PPI gtt - no indication for emergent surgical intervention at this time  FEN: CLD - advancement per GI VTE: SCDs, no chemical prophylaxis in setting of GIB ID: none current   Juliet Rude,  [facility] Surgery 08/25/2020, 9:48 AM Please see Amion for pager number during day hours 7:00am-4:30pm

## 2020-08-25 NOTE — Progress Notes (Signed)
Physical Therapy Treatment Patient Details Name: Elizabeth Haynes MRN: [id number] DOB: 01/19/1946 Today's Date: 08/25/2020    History of Present Illness 74 yo female admitted with GI bleed. Hx of osteopenia, osteoporosis    PT Comments    The patient ambulated x 100' using Rw. Patient does ask for guidance  As to what she should do" Should I walk now?" Patient does require min guard for ambulation for safety. Continue [progressive mobility. Patient hopes to Dc home soon.  Follow Up Recommendations  Home health PT, patient may benefit from 24/7 supervision initially.      Equipment Recommendations  None recommended by PT    Recommendations for Other Services       Precautions / Restrictions Precautions Precautions: Fall    Mobility  Bed Mobility         Supine to sit: Supervision Sit to supine: Supervision   General bed mobility comments: from flat surface  Transfers   Equipment used: Rolling walker (2 wheeled) Transfers: Sit to/from Stand Sit to Stand: Min guard         General transfer comment: Min guard for safety.  Ambulation/Gait Ambulation/Gait assistance: Min guard Gait Distance (Feet): 100 Feet Assistive device: Rolling walker (2 wheeled) Gait Pattern/deviations: Step-through pattern;Decreased stride length Gait velocity: decr   General Gait Details: Min guard for safety. Mildly unsteady. Cues for turning   Stairs             Wheelchair Mobility    Modified Rankin (Stroke Patients Only)       Balance Overall balance assessment: Needs assistance                                          Cognition Arousal/Alertness: Awake/alert Behavior During Therapy: WFL for tasks assessed/performed Overall Cognitive Status: Within Functional Limits for tasks assessed                                 General Comments: at times relies on friend for guidance      Exercises      General Comments         Pertinent Vitals/Pain Pain Assessment: No/denies pain    Home Living                      Prior Function            PT Goals (current goals can now be found in the care plan section) Progress towards PT goals: Progressing toward goals    Frequency    Min 3X/week      PT Plan Current plan remains appropriate    Co-evaluation              AM-PAC PT "6 Clicks" Mobility   Outcome Measure  Help needed turning from your back to your side while in a flat bed without using bedrails?: A Little Help needed moving from lying on your back to sitting on the side of a flat bed without using bedrails?: A Little Help needed moving to and from a bed to a chair (including a wheelchair)?: A Little Help needed standing up from a chair using your arms (e.g., wheelchair or bedside chair)?: A Little Help needed to walk in hospital room?: A Little Help needed climbing 3-5 steps with a railing? : A Little  6 Click Score: 18    End of Session   Activity Tolerance: Patient tolerated treatment well Patient left: in bed;with call bell/phone within reach;with family/visitor present Nurse Communication: Mobility status PT Visit Diagnosis: Muscle weakness (generalized) (M62.81);Difficulty in walking, not elsewhere classified (R26.2);Unsteadiness on feet (R26.81);History of falling (Z91.81)     Time: 6945-0388 PT Time Calculation (min) (ACUTE ONLY): 25 min  Charges:  $Gait Training: 23-37 mins                      Blanchard Kelch PT Acute Rehabilitation Services Pager [phone] Office [phone]    Rada Hay 08/25/2020, 4:25 PM

## 2020-08-25 NOTE — Progress Notes (Signed)
Nutrition Follow-up  DOCUMENTATION CODES:   Non-severe (moderate) malnutrition in context of chronic illness, Underweight  INTERVENTION:   -Boost Breeze po TID, each supplement provides 250 kcal and 9 grams of protein -Multivitamin with minerals daily  Once diet advanced:  [employer] 1.4 po BID, each supplement provides 455 kcal and 20 grams protein.   NEW NUTRITION DIAGNOSIS:   Moderate Malnutrition related to chronic illness (duodenal and gastric ulcers) as evidenced by percent weight loss, energy intake < or equal to 75% for > or equal to 1 month, moderate fat depletion, moderate muscle depletion.  GOAL:   Patient will meet greater than or equal to 90% of their needs  Not meeting.  MONITOR:   PO intake, Supplement acceptance, Weight trends, I & O's, Labs, Diet advancement  ASSESSMENT:   74 y.o. female with medical history significant of osteoporosis. Presents with fatigue. Recent discharge from rehab facility. In normal state of health until 3 days ago when she noted some increased fatigue and difficulty getting around.  9/27: s/p EGD  Patient in room with no visitors at bedside. Pt reports being confused as to what the plan is for her going forward. Pt very anxious and fixated on her plan of care. Per discussions with MD and RN, this is pt's baseline. Pt's friend was coming to hospital soon to be with pt.   Pt reports she has had weight loss that started a month ago. States she used to weigh 108 lbs. Per weight records, pt weighed 100 lbs on 9/1. Pt has had a 5% weight loss x 1 month which is significant for time frame. Current weight: 95 lbs.  Medications: B-complex with Vitamin C, OSCAL, Multivitamin with minerals daily, Vitamin E  Labs reviewed: Low Na, K Mg WNL  NUTRITION - FOCUSED PHYSICAL EXAM:    Most Recent Value  Orbital Region Mild depletion  Upper Arm Region Severe depletion  Thoracic and Lumbar Region Unable to assess  Buccal Region Mild depletion   Temple Region Moderate depletion  Clavicle Bone Region Moderate depletion  Clavicle and Acromion Bone Region Moderate depletion  Scapular Bone Region Moderate depletion  Dorsal Hand Moderate depletion  Patellar Region Unable to assess  Anterior Thigh Region Unable to assess  Posterior Calf Region Unable to assess  Edema (RD Assessment) None  Hair Reviewed  Eyes Reviewed  Mouth Reviewed  Skin Reviewed       Diet Order:   Diet Order            Diet clear liquid Room service appropriate? Yes; Fluid consistency: Thin  Diet effective now                 EDUCATION NEEDS:   Not appropriate for education at this time  Skin:  Skin Assessment: Reviewed RN Assessment  Last BM:  9/29 -type 7  Height:   Ht Readings from Last 1 Encounters:  08/23/20 5\' 2"  (1.575 m)    Weight:   Wt Readings from Last 1 Encounters:  08/23/20 43.1 kg   BMI:  Body mass index is 17.38 kg/m.  Estimated Nutritional Needs:   Kcal:  1300-1500  Protein:  65-75g  Fluid:  1.5L/day  08/25/20, MS, RD, LDN Inpatient Clinical Dietitian Contact information available via Amion

## 2020-08-25 NOTE — Progress Notes (Signed)
Referring Physician(s): Burnard Bunting  Supervising Physician: Irish Lack  Patient Status:  [facility] - In-pt  Chief Complaint:  Bloody stools, pelvic pain  Subjective: Patient continues to have some maroon-colored stools as well as some mild pelvic discomfort; no nausea or vomiting; on liquid diet; was transfused yesterday, hemoglobin currently 10.9   Allergies: Tramadol and Bactrim [sulfamethoxazole-trimethoprim]  Medications: Prior to Admission medications   Medication Sig Start Date End Date Taking? Authorizing Provider  Azelaic Acid (FINACEA EX) Apply topically.   Yes [provider]  b complex vitamins capsule Take 1 capsule by mouth daily.   Yes [provider]  calcium carbonate 1250 MG capsule Take 1,250 mg by mouth 2 (two) times daily with a meal.   Yes [provider]  cholecalciferol (VITAMIN D) 1000 UNITS tablet Take 1,000 Units by mouth daily.   Yes [provider]  ciprofloxacin (CIPRO) 500 MG tablet Take 1 tablet (500 mg total) by mouth 2 (two) times daily. 08/21/20  Yes Ronnald Nian, MD  HYDROcodone-acetaminophen (NORCO/VICODIN) 5-325 MG tablet Take 1 tablet by mouth 4 (four) times daily as needed for pain. 07/30/20  Yes [provider]  Multiple Vitamins-Minerals (MULTIVITAMIN WITH MINERALS) tablet Take 1 tablet by mouth daily.   Yes [provider]  olopatadine (PATADAY) 0.1 % ophthalmic solution Place 1 drop into both eyes daily as needed for allergies.   Yes [provider]  vitamin E 1000 UNIT capsule Take 1,000 Units by mouth daily.   Yes [provider]     Vital Signs: BP 125/69 (BP Location: Left Arm)   Pulse 72   Temp 97.8 F (36.6 C) (Oral)   Resp 17   Ht 5\' 2"  (1.575 m)   Wt 95 lb 0.3 oz (43.1 kg)   SpO2 99%   BMI 17.38 kg/m   Physical Exam awake, alert.  Abdomen soft, some mild pelvic discomfort to palpation, puncture site right common femoral artery soft, clean, dry,  minimal tenderness, no distinct hematoma  Imaging: DG Chest 2 View  Result Date: 08/22/2020 CLINICAL DATA:  Syncope. EXAM: CHEST - 2 VIEW COMPARISON:  None. FINDINGS: Cardiomediastinal silhouette is normal. Mediastinal contours appear intact. There is no evidence of focal airspace consolidation, pleural effusion or pneumothorax. Osseous structures are without acute abnormality. Soft tissues are grossly normal. IMPRESSION: No active cardiopulmonary disease. Electronically Signed   By: 08/24/2020 M.D.   On: 08/22/2020 11:52   CT Head Wo Contrast  Result Date: 08/22/2020 CLINICAL DATA:  Recent syncopal episode EXAM: CT HEAD WITHOUT CONTRAST TECHNIQUE: Contiguous axial images were obtained from the base of the skull through the vertex without intravenous contrast. COMPARISON:  None. FINDINGS: Brain: Mild atrophic changes and chronic white matter ischemic changes are noted. No findings to suggest acute hemorrhage, acute infarction or space-occupying mass lesion are noted. Vascular: No hyperdense vessel or unexpected calcification. Skull: Normal. Negative for fracture or focal lesion. Sinuses/Orbits: No acute finding. Other: None. IMPRESSION: Chronic atrophic and ischemic changes without acute abnormality. Electronically Signed   By: 08/24/2020 M.D.   On: 08/22/2020 12:05   IR Angiogram Visceral Selective  Result Date: 08/24/2020 INDICATION: 74 year old female with significant upper GI bleeding from a large duodenal ulcer with visible vessel in the ulcer bed. She continued to have bleeding overnight with decreased hemoglobin this morning. She presents for visceral selective ultrasound and embolization. EXAM: SELECTIVE VISCERAL ARTERIOGRAPHY; IR ULTRASOUND GUIDANCE VASC ACCESS RIGHT; IR EMBO ART VEN HEMORR LYMPH EXTRAV INC GUIDE ROADMAPPING;  ADDITIONAL ARTERIOGRAPHY MEDICATIONS: None ANESTHESIA/SEDATION: Moderate (conscious) sedation was employed during this procedure. A total of Versed 2 mg and  Fentanyl 45 mcg was administered intravenously. Moderate Sedation Time: 40 minutes. The patient's level of consciousness and vital signs were monitored continuously by radiology nursing throughout the procedure under my direct supervision. CONTRAST:  45mL OMNIPAQUE IOHEXOL 300 MG/ML  SOLN FLUOROSCOPY TIME:  Fluoroscopy Time: 10 minutes 54 seconds (240 mGy). COMPLICATIONS: None immediate. PROCEDURE: Informed consent was obtained from the patient following explanation of the procedure, risks, benefits and alternatives. The patient understands, agrees and consents for the procedure. All questions were addressed. A time out was performed prior to the initiation of the procedure. Maximal barrier sterile technique utilized including caps, mask, sterile gowns, sterile gloves, large sterile drape, hand hygiene, and Betadine prep. The right common femoral artery was interrogated with ultrasound and found to be widely patent. An image was obtained and stored for the medical record. Local anesthesia was attained by infiltration with 1% lidocaine. A small dermatotomy was made. Under real-time sonographic guidance, the vessel was punctured with a 21 gauge micropuncture needle. Using standard technique, the initial micro needle was exchanged over a 0.018 micro wire for a transitional 4 [place] micro sheath. The micro sheath was then exchanged over a 0.035 wire for a 5 French vascular sheath. A C2 cobra catheter was advanced over a Bentson wire into the abdominal aorta. The catheter was used to select the superior mesenteric artery. A superior mesenteric arteriogram was performed. No evidence of replaced right hepatic or enlarged pancreaticoduodenal arteries. Next, the C2 catheter was used to select the celiac axis. A celiac arteriogram was performed. Conventional hepatic arterial anatomy. There is a focal region of narrowing in the mid aspect of the gastroduodenal artery concerning for a site of vascular injury. Due to the down  sloping configuration of the celiac axis, the C2 cobra catheter was exchanged for a Sos Omni selective catheter which was successfully engaged into the celiac artery. A renegade STC microcatheter was then advanced over a Fathom 16 wire into the gastroduodenal artery. Arteriography was performed. This confirms a focal narrowing in the mid gastroduodenal artery. Coil embolization was then performed using a combination of interlock and Ruby detachable microcoils. The mid and distal gastroduodenal artery were successfully coiled to stasis. Contrast injection into the gastroduodenal stump demonstrates reflux back into the hepatic artery. There is no forward flow. The catheter system was removed. Hemostasis was attained using a Cordis extra arterial vascular plug. IMPRESSION: 1. Focal region of narrowing in the mid aspect of the gastroduodenal artery concerning for region of spasm secondary to irritation from the overlying ulcer bed. This may represent the source of intermittent bleeding. 2. Successful coil embolization of the gastroduodenal artery. Signed, Sterling Big, MD, RPVI [facility] Radiology Electronically Signed   By: Malachy Moan M.D.   On: 08/24/2020 15:45   IR Angiogram Visceral Selective  Result Date: 08/24/2020 INDICATION: 75 year old female with significant upper GI bleeding from a large duodenal ulcer with visible vessel in the ulcer bed. She continued to have bleeding overnight with decreased hemoglobin this morning. She presents for visceral selective ultrasound and embolization. EXAM: SELECTIVE VISCERAL ARTERIOGRAPHY; IR ULTRASOUND GUIDANCE VASC ACCESS RIGHT; IR EMBO ART VEN HEMORR LYMPH EXTRAV INC GUIDE ROADMAPPING; ADDITIONAL ARTERIOGRAPHY MEDICATIONS: None ANESTHESIA/SEDATION: Moderate (conscious) sedation was employed during this procedure. A total of Versed 2 mg and Fentanyl 45 mcg was administered intravenously. Moderate Sedation  Time: 40 minutes. The  patient's level of consciousness and vital signs were monitored continuously by radiology nursing throughout the procedure under my direct supervision. CONTRAST:  45mL OMNIPAQUE IOHEXOL 300 MG/ML  SOLN FLUOROSCOPY TIME:  Fluoroscopy Time: 10 minutes 54 seconds (240 mGy). COMPLICATIONS: None immediate. PROCEDURE: Informed consent was obtained from the patient following explanation of the procedure, risks, benefits and alternatives. The patient understands, agrees and consents for the procedure. All questions were addressed. A time out was performed prior to the initiation of the procedure. Maximal barrier sterile technique utilized including caps, mask, sterile gowns, sterile gloves, large sterile drape, hand hygiene, and Betadine prep. The right common femoral artery was interrogated with ultrasound and found to be widely patent. An image was obtained and stored for the medical record. Local anesthesia was attained by infiltration with 1% lidocaine. A small dermatotomy was made. Under real-time sonographic guidance, the vessel was punctured with a 21 gauge micropuncture needle. Using standard technique, the initial micro needle was exchanged over a 0.018 micro wire for a transitional 4 [place] micro sheath. The micro sheath was then exchanged over a 0.035 wire for a 5 French vascular sheath. A C2 cobra catheter was advanced over a Bentson wire into the abdominal aorta. The catheter was used to select the superior mesenteric artery. A superior mesenteric arteriogram was performed. No evidence of replaced right hepatic or enlarged pancreaticoduodenal arteries. Next, the C2 catheter was used to select the celiac axis. A celiac arteriogram was performed. Conventional hepatic arterial anatomy. There is a focal region of narrowing in the mid aspect of the gastroduodenal artery concerning for a site of vascular injury. Due to the down sloping configuration of the celiac axis, the C2 cobra catheter was  exchanged for a Sos Omni selective catheter which was successfully engaged into the celiac artery. A renegade STC microcatheter was then advanced over a Fathom 16 wire into the gastroduodenal artery. Arteriography was performed. This confirms a focal narrowing in the mid gastroduodenal artery. Coil embolization was then performed using a combination of interlock and Ruby detachable microcoils. The mid and distal gastroduodenal artery were successfully coiled to stasis. Contrast injection into the gastroduodenal stump demonstrates reflux back into the hepatic artery. There is no forward flow. The catheter system was removed. Hemostasis was attained using a Cordis extra arterial vascular plug. IMPRESSION: 1. Focal region of narrowing in the mid aspect of the gastroduodenal artery concerning for region of spasm secondary to irritation from the overlying ulcer bed. This may represent the source of intermittent bleeding. 2. Successful coil embolization of the gastroduodenal artery. Signed, Sterling Big, MD, RPVI [facility] Radiology Electronically Signed   By: Malachy Moan M.D.   On: 08/24/2020 15:45   IR Angiogram Selective Each Additional Vessel  Result Date: 08/24/2020 INDICATION: 74 year old female with significant upper GI bleeding from a large duodenal ulcer with visible vessel in the ulcer bed. She continued to have bleeding overnight with decreased hemoglobin this morning. She presents for visceral selective ultrasound and embolization. EXAM: SELECTIVE VISCERAL ARTERIOGRAPHY; IR ULTRASOUND GUIDANCE VASC ACCESS RIGHT; IR EMBO ART VEN HEMORR LYMPH EXTRAV INC GUIDE ROADMAPPING; ADDITIONAL ARTERIOGRAPHY MEDICATIONS: None ANESTHESIA/SEDATION: Moderate (conscious) sedation was employed during this procedure. A total of Versed 2 mg and Fentanyl 45 mcg was administered intravenously. Moderate Sedation Time: 40 minutes. The patient's level of consciousness  and vital signs were monitored continuously by radiology nursing throughout the procedure under my direct supervision. CONTRAST:  45mL OMNIPAQUE IOHEXOL 300 MG/ML  SOLN FLUOROSCOPY TIME:  Fluoroscopy Time: 10 minutes 54 seconds (240 mGy). COMPLICATIONS: None immediate. PROCEDURE: Informed consent was obtained from the patient following explanation of the procedure, risks, benefits and alternatives. The patient understands, agrees and consents for the procedure. All questions were addressed. A time out was performed prior to the initiation of the procedure. Maximal barrier sterile technique utilized including caps, mask, sterile gowns, sterile gloves, large sterile drape, hand hygiene, and Betadine prep. The right common femoral artery was interrogated with ultrasound and found to be widely patent. An image was obtained and stored for the medical record. Local anesthesia was attained by infiltration with 1% lidocaine. A small dermatotomy was made. Under real-time sonographic guidance, the vessel was punctured with a 21 gauge micropuncture needle. Using standard technique, the initial micro needle was exchanged over a 0.018 micro wire for a transitional 4 [place] micro sheath. The micro sheath was then exchanged over a 0.035 wire for a 5 French vascular sheath. A C2 cobra catheter was advanced over a Bentson wire into the abdominal aorta. The catheter was used to select the superior mesenteric artery. A superior mesenteric arteriogram was performed. No evidence of replaced right hepatic or enlarged pancreaticoduodenal arteries. Next, the C2 catheter was used to select the celiac axis. A celiac arteriogram was performed. Conventional hepatic arterial anatomy. There is a focal region of narrowing in the mid aspect of the gastroduodenal artery concerning for a site of vascular injury. Due to the down sloping configuration of the celiac axis, the C2 cobra catheter was exchanged for a Sos Omni selective catheter which was  successfully engaged into the celiac artery. A renegade STC microcatheter was then advanced over a Fathom 16 wire into the gastroduodenal artery. Arteriography was performed. This confirms a focal narrowing in the mid gastroduodenal artery. Coil embolization was then performed using a combination of interlock and Ruby detachable microcoils. The mid and distal gastroduodenal artery were successfully coiled to stasis. Contrast injection into the gastroduodenal stump demonstrates reflux back into the hepatic artery. There is no forward flow. The catheter system was removed. Hemostasis was attained using a Cordis extra arterial vascular plug. IMPRESSION: 1. Focal region of narrowing in the mid aspect of the gastroduodenal artery concerning for region of spasm secondary to irritation from the overlying ulcer bed. This may represent the source of intermittent bleeding. 2. Successful coil embolization of the gastroduodenal artery. Signed, Sterling Big, MD, RPVI [facility] Radiology Electronically Signed   By: Malachy Moan M.D.   On: 08/24/2020 15:45   IR US Guide Vasc Access Right  Result Date: 08/24/2020 INDICATION: 74 year old female with significant upper GI bleeding from a large duodenal ulcer with visible vessel in the ulcer bed. She continued to have bleeding overnight with decreased hemoglobin this morning. She presents for visceral selective ultrasound and embolization. EXAM: SELECTIVE VISCERAL ARTERIOGRAPHY; IR ULTRASOUND GUIDANCE VASC ACCESS RIGHT; IR EMBO ART VEN HEMORR LYMPH EXTRAV INC GUIDE ROADMAPPING; ADDITIONAL ARTERIOGRAPHY MEDICATIONS: None ANESTHESIA/SEDATION: Moderate (conscious) sedation was employed during this procedure. A total of Versed 2 mg and Fentanyl 45 mcg was administered intravenously. Moderate Sedation Time: 40 minutes. The patient's level of consciousness and vital signs were monitored continuously by radiology nursing  throughout the procedure under my direct supervision. CONTRAST:  22mL OMNIPAQUE IOHEXOL 300 MG/ML  SOLN FLUOROSCOPY TIME:  Fluoroscopy Time: 10 minutes 54 seconds (240 mGy). COMPLICATIONS: None immediate. PROCEDURE: Informed consent was obtained from the patient following explanation of the procedure, risks, benefits and alternatives. The patient understands,  agrees and consents for the procedure. All questions were addressed. A time out was performed prior to the initiation of the procedure. Maximal barrier sterile technique utilized including caps, mask, sterile gowns, sterile gloves, large sterile drape, hand hygiene, and Betadine prep. The right common femoral artery was interrogated with ultrasound and found to be widely patent. An image was obtained and stored for the medical record. Local anesthesia was attained by infiltration with 1% lidocaine. A small dermatotomy was made. Under real-time sonographic guidance, the vessel was punctured with a 21 gauge micropuncture needle. Using standard technique, the initial micro needle was exchanged over a 0.018 micro wire for a transitional 4 [place] micro sheath. The micro sheath was then exchanged over a 0.035 wire for a 5 French vascular sheath. A C2 cobra catheter was advanced over a Bentson wire into the abdominal aorta. The catheter was used to select the superior mesenteric artery. A superior mesenteric arteriogram was performed. No evidence of replaced right hepatic or enlarged pancreaticoduodenal arteries. Next, the C2 catheter was used to select the celiac axis. A celiac arteriogram was performed. Conventional hepatic arterial anatomy. There is a focal region of narrowing in the mid aspect of the gastroduodenal artery concerning for a site of vascular injury. Due to the down sloping configuration of the celiac axis, the C2 cobra catheter was exchanged for a Sos Omni selective catheter which was successfully engaged into the celiac artery. A renegade STC  microcatheter was then advanced over a Fathom 16 wire into the gastroduodenal artery. Arteriography was performed. This confirms a focal narrowing in the mid gastroduodenal artery. Coil embolization was then performed using a combination of interlock and Ruby detachable microcoils. The mid and distal gastroduodenal artery were successfully coiled to stasis. Contrast injection into the gastroduodenal stump demonstrates reflux back into the hepatic artery. There is no forward flow. The catheter system was removed. Hemostasis was attained using a Cordis extra arterial vascular plug. IMPRESSION: 1. Focal region of narrowing in the mid aspect of the gastroduodenal artery concerning for region of spasm secondary to irritation from the overlying ulcer bed. This may represent the source of intermittent bleeding. 2. Successful coil embolization of the gastroduodenal artery. Signed, Sterling Big, MD, RPVI [facility] Radiology Electronically Signed   By: Malachy Moan M.D.   On: 08/24/2020 15:45   IR EMBO ART  VEN HEMORR LYMPH EXTRAV  INC GUIDE ROADMAPPING  Result Date: 08/24/2020 INDICATION: 74 year old female with significant upper GI bleeding from a large duodenal ulcer with visible vessel in the ulcer bed. She continued to have bleeding overnight with decreased hemoglobin this morning. She presents for visceral selective ultrasound and embolization. EXAM: SELECTIVE VISCERAL ARTERIOGRAPHY; IR ULTRASOUND GUIDANCE VASC ACCESS RIGHT; IR EMBO ART VEN HEMORR LYMPH EXTRAV INC GUIDE ROADMAPPING; ADDITIONAL ARTERIOGRAPHY MEDICATIONS: None ANESTHESIA/SEDATION: Moderate (conscious) sedation was employed during this procedure. A total of Versed 2 mg and Fentanyl 45 mcg was administered intravenously. Moderate Sedation Time: 40 minutes. The patient's level of consciousness and vital signs were monitored continuously by radiology nursing throughout the procedure under my  direct supervision. CONTRAST:  45mL OMNIPAQUE IOHEXOL 300 MG/ML  SOLN FLUOROSCOPY TIME:  Fluoroscopy Time: 10 minutes 54 seconds (240 mGy). COMPLICATIONS: None immediate. PROCEDURE: Informed consent was obtained from the patient following explanation of the procedure, risks, benefits and alternatives. The patient understands, agrees and consents for the procedure. All questions were addressed. A time out was performed prior to the initiation of the procedure. Maximal barrier sterile  technique utilized including caps, mask, sterile gowns, sterile gloves, large sterile drape, hand hygiene, and Betadine prep. The right common femoral artery was interrogated with ultrasound and found to be widely patent. An image was obtained and stored for the medical record. Local anesthesia was attained by infiltration with 1% lidocaine. A small dermatotomy was made. Under real-time sonographic guidance, the vessel was punctured with a 21 gauge micropuncture needle. Using standard technique, the initial micro needle was exchanged over a 0.018 micro wire for a transitional 4 [place] micro sheath. The micro sheath was then exchanged over a 0.035 wire for a 5 French vascular sheath. A C2 cobra catheter was advanced over a Bentson wire into the abdominal aorta. The catheter was used to select the superior mesenteric artery. A superior mesenteric arteriogram was performed. No evidence of replaced right hepatic or enlarged pancreaticoduodenal arteries. Next, the C2 catheter was used to select the celiac axis. A celiac arteriogram was performed. Conventional hepatic arterial anatomy. There is a focal region of narrowing in the mid aspect of the gastroduodenal artery concerning for a site of vascular injury. Due to the down sloping configuration of the celiac axis, the C2 cobra catheter was exchanged for a Sos Omni selective catheter which was successfully engaged into the celiac artery. A renegade STC microcatheter was then advanced over a  Fathom 16 wire into the gastroduodenal artery. Arteriography was performed. This confirms a focal narrowing in the mid gastroduodenal artery. Coil embolization was then performed using a combination of interlock and Ruby detachable microcoils. The mid and distal gastroduodenal artery were successfully coiled to stasis. Contrast injection into the gastroduodenal stump demonstrates reflux back into the hepatic artery. There is no forward flow. The catheter system was removed. Hemostasis was attained using a Cordis extra arterial vascular plug. IMPRESSION: 1. Focal region of narrowing in the mid aspect of the gastroduodenal artery concerning for region of spasm secondary to irritation from the overlying ulcer bed. This may represent the source of intermittent bleeding. 2. Successful coil embolization of the gastroduodenal artery. Signed, Sterling Big, MD, RPVI [facility] Radiology Electronically Signed   By: Malachy Moan M.D.   On: 08/24/2020 15:45    Labs:  CBC: Recent Labs    08/24/20 0437 08/24/20 1412 08/24/20 2101 08/25/20 0825  WBC 13.8* 13.4* 13.5* 13.3*  HGB 9.4* 7.5* 6.2* 10.9*  HCT 26.5* 21.3* 18.2* 30.9*  PLT 214 213 187 189    COAGS: Recent Labs    08/24/20 0437  INR 1.2    BMP: Recent Labs    08/22/20 1030 08/23/20 0220 08/24/20 0437 08/25/20 0825  NA 128* 131* 132* 129*  K 4.4 3.7 3.6 3.2*  CL 92* 100 100 97*  CO2 27 24 26 25   GLUCOSE 120* 106* 120* 127*  BUN 48* 27* 32* 27*  CALCIUM 8.8* 8.0* 7.8* 7.8*  CREATININE 0.62 0.44 0.45 0.45  GFRNONAA >60 >60 >60 >60  GFRAA >60 >60 >60 >60    LIVER FUNCTION TESTS: Recent Labs    08/23/20 0220  BILITOT 0.9  AST 14*  ALT 14  ALKPHOS 76  PROT 5.1*  ALBUMIN 2.5*    Assessment and Plan: Patient with history of upper GI bleeding from large duodenal ulcer with visible vessel in the ulcer bed; status post successful coil embolization of the GDA 9/28;  afebrile, WBC 13.3, hemoglobin 10.9 up from 6.2, potassium 3.2-replace, creatinine normal; continue to trend hemoglobin, hydrate; diet as per primary service  Electronically Signed: D. Jeananne RamaKevin Tonisha Silvey, PA-C 08/25/2020, 11:05 AM   I spent a total of 15 minutes at the the patient's bedside AND on the patient's hospital floor or unit, greater than 50% of which was counseling/coordinating care for visceral arteriogram with embolization    Patient ID: Elizabeth Haynes, female   DOB: 01/01/1946, 74 y.o.   MRN: [id number]

## 2020-08-26 LAB — BPAM RBC
Blood Product Expiration Date: 202110092359
Blood Product Expiration Date: 202110092359
Blood Product Expiration Date: 202110142359
Blood Product Expiration Date: 202110142359
Blood Product Expiration Date: 202110162359
Blood Product Expiration Date: 202110162359
ISSUE DATE / TIME: 202109261700
ISSUE DATE / TIME: 202109262024
ISSUE DATE / TIME: 202109271828
ISSUE DATE / TIME: 202109272233
ISSUE DATE / TIME: 202109282331
ISSUE DATE / TIME: 202109290244
Unit Type and Rh: 6200
Unit Type and Rh: 6200
Unit Type and Rh: 6200
Unit Type and Rh: 6200
Unit Type and Rh: 6200
Unit Type and Rh: 6200

## 2020-08-26 LAB — TYPE AND SCREEN
ABO/RH(D): A POS
Antibody Screen: NEGATIVE
Unit division: 0
Unit division: 0
Unit division: 0
Unit division: 0
Unit division: 0
Unit division: 0

## 2020-08-26 LAB — BASIC METABOLIC PANEL
Anion gap: 9 (ref 5–15)
BUN: 15 mg/dL (ref 8–23)
CO2: 24 mmol/L (ref 22–32)
Calcium: 8.1 mg/dL — ABNORMAL LOW (ref 8.9–10.3)
Chloride: 96 mmol/L — ABNORMAL LOW (ref 98–111)
Creatinine, Ser: 0.38 mg/dL — ABNORMAL LOW (ref 0.44–1.00)
GFR calc Af Amer: >60 mL/min (ref >60)
GFR calc non Af Amer: >60 mL/min (ref >60)
Glucose, Bld: 106 mg/dL — ABNORMAL HIGH (ref 70–99)
Potassium: 3.5 mmol/L (ref 3.5–5.1)
Sodium: 129 mmol/L — ABNORMAL LOW (ref 135–145)

## 2020-08-26 LAB — CBC WITH DIFFERENTIAL/PLATELET
Abs Immature Granulocytes: 0.13 10*3/uL — ABNORMAL HIGH (ref 0.00–0.07)
Basophils Absolute: 0 10*3/uL (ref 0.0–0.1)
Basophils Relative: 0 %
Eosinophils Absolute: 0.3 10*3/uL (ref 0.0–0.5)
Eosinophils Relative: 3 %
HCT: 28.6 % — ABNORMAL LOW (ref 36.0–46.0)
Hemoglobin: 9.9 g/dL — ABNORMAL LOW (ref 12.0–15.0)
Immature Granulocytes: 1 %
Lymphocytes Relative: 13 %
Lymphs Abs: 1.4 10*3/uL (ref 0.7–4.0)
MCH: 31.4 pg (ref 26.0–34.0)
MCHC: 34.6 g/dL (ref 30.0–36.0)
MCV: 90.8 fL (ref 80.0–100.0)
Monocytes Absolute: 1 10*3/uL (ref 0.1–1.0)
Monocytes Relative: 9 %
Neutro Abs: 8.3 10*3/uL — ABNORMAL HIGH (ref 1.7–7.7)
Neutrophils Relative %: 74 %
Platelets: 197 10*3/uL (ref 150–400)
RBC: 3.15 MIL/uL — ABNORMAL LOW (ref 3.87–5.11)
RDW: 16.5 % — ABNORMAL HIGH (ref 11.5–15.5)
WBC: 11.2 10*3/uL — ABNORMAL HIGH (ref 4.0–10.5)
nRBC: 0 % (ref 0.0–0.2)

## 2020-08-26 LAB — MAGNESIUM: Magnesium: 2.1 mg/dL (ref 1.7–2.4)

## 2020-08-26 NOTE — TOC Initial Note (Addendum)
Transition of Care [facility]) - Initial/Assessment Note    Patient Details  Name: Elizabeth Haynes MRN: [id number] Date of Birth: [birth year]  [facility] CM/SW Contact:    Lynnell Catalan, RN Phone Number: 08/26/2020, 1:18 PM  Clinical Narrative:                 This CM met with pt and sister at bedside for dc planning. They deferred planning conversation to pt friend Manuela Schwartz. This CM contacted Manuela Schwartz via phone. Manuela Schwartz states that she has hired 24hr caregivers for pt when she discharges. She also states that pt would like to be set up with [facility] for HHPT/OT/Aide  This CM contacted [facility] liaison to inform of need for referral. Will need MD orders for HHPT/OT/Aide.  Addendum:  [facility] unable to provide bath aide for home at this time. Manuela Schwartz made aware and still wants to stay with [facility].  Expected Discharge Plan: [place] Barriers to Discharge: Continued Medical Work up   Patient Goals and CMS Choice Patient states their goals for this hospitalization and ongoing recovery are:: To get home CMS Medicare.gov Compare Post Acute Care list provided to:: Patient Choice offered to / list presented to : Patient  Expected Discharge Plan and Services Expected Discharge Plan: [place]   Discharge Planning Services: CM Consult Post Acute Care Choice: [place] arrangements for the past 2 months: [place]: PT, OT, Nurse's Aide [place] Agency: [place] ([facility]) Date HH Agency Contacted: 08/26/20 Time [place]: [id number] Representative spoke with at [place]: Santiago Glad  Prior Living Arrangements/Services Living arrangements for the past 2 months: [place] Lives with:: Friends Patient language and need for interpreter reviewed:: Yes Do you feel safe going back to the place where you live?: Yes      Need for Family Participation in Patient Care: Yes (Comment) Care  giver support system in place?: Yes (comment) Current home services: Home OT, Home PT, Homehealth aide Criminal Activity/Legal Involvement Pertinent to Current Situation/Hospitalization: No - Comment as needed  Activities of Daily Living Home Assistive Devices/Equipment: None ADL Screening (condition at time of admission) Patient's cognitive ability adequate to safely complete daily activities?: Yes Is the patient deaf or have difficulty hearing?: No Does the patient have difficulty seeing, even when wearing glasses/contacts?: No Does the patient have difficulty concentrating, remembering, or making decisions?: Yes Patient able to express need for assistance with ADLs?: Yes Does the patient have difficulty dressing or bathing?: Yes Independently performs ADLs?: No Communication: Needs assistance Is this a change from baseline?: Change from baseline, expected to last <3 days Dressing (OT): Needs assistance Is this a change from baseline?: Change from baseline, expected to last <3days Grooming: Needs assistance Is this a change from baseline?: Change from baseline, expected to last <3 days Feeding: Independent Bathing: Needs assistance Is this a change from baseline?: Change from baseline, expected to last <3 days Toileting: Needs assistance Is this a change from baseline?: Change from baseline, expected to last <3 days In/Out Bed: Needs assistance Is this a change from baseline?: Change from baseline, expected to last >3 days Walks in Home: Needs assistance Is this a change from baseline?: Change from baseline, expected to last >3 days Does the patient have difficulty walking or climbing stairs?:  Yes Weakness of Legs: Both Weakness of Arms/Hands: Both  Permission Sought/Granted Permission sought to share information with : Chartered certified accountant granted to share information with : Yes, Verbal Permission Granted     Permission granted to share info w AGENCY:  [facility]        Emotional Assessment Appearance:: Appears stated age Attitude/Demeanor/Rapport: Gracious Affect (typically observed): Calm        Admission diagnosis:  GIB (gastrointestinal bleeding) [K92.2] Acute GI bleeding [K92.2] Symptomatic anemia [D64.9] Patient Active Problem List   Diagnosis Date Noted  . Malnutrition of moderate degree 08/25/2020  . GIB (gastrointestinal bleeding) 08/22/2020  . Osteoporosis without current pathological fracture 07/07/2018  . Osteopenia 10/01/2013  . Rosacea 10/01/2013  . Allergic rhinitis, cause unspecified 10/01/2013   PCP:  Rita Ohara, MD Pharmacy:   [facility], [place] [address] Phone: [phone] Fax: [phone]     Social Determinants of Health (SDOH) Interventions    Readmission Risk Interventions No flowsheet data found.

## 2020-08-26 NOTE — Progress Notes (Signed)
3 Days Post-Op    CC:  GI bleed  Subjective: Hungry and wants to eat.  No abdominal pain.    Objective: Vital signs in last 24 hours: Temp:  [98.3 F (36.8 C)-98.4 F (36.9 C)] 98.3 F (36.8 C) (09/30 0640) Pulse Rate:  [67-74] 67 (09/30 0640) Resp:  [14-15] 15 (09/30 0640) BP: (109-116)/(76-87) 109/87 (09/30 0640) SpO2:  [100 %] 100 % (09/30 0640) Last BM Date: 08/25/20 400 urine Stool x 1 . pantoprozole (PROTONIX) infusion 8 mg/hr (08/25/20 1447)  NA 129, potassium 3.5, glucose 106, creatinine 0.38, magnesium 2.1, WBC 11.2 H/H 9.9/28.6 Coil embolization of the gastroduodenal artery 08/24/2020   Intake/Output from previous day: 09/29 0701 - 09/30 0700 In: -  Out: 400 [Urine:400] Intake/Output this shift: No intake/output data recorded.  General appearance: alert, cooperative and no distress GI: soft, non-tender; bowel sounds normal; no masses,  no organomegaly  Lab Results:  Recent Labs    08/25/20 0825 08/25/20 0825 08/25/20 1801 08/26/20 0034  WBC 13.3*  --   --  11.2*  HGB 10.9*   < > 11.4* 9.9*  HCT 30.9*   < > 32.2* 28.6*  PLT 189  --   --  197   < > = values in this interval not displayed.    BMET Recent Labs    08/25/20 0825 08/26/20 0034  NA 129* 129*  K 3.2* 3.5  CL 97* 96*  CO2 25 24  GLUCOSE 127* 106*  BUN 27* 15  CREATININE 0.45 0.38*  CALCIUM 7.8* 8.1*   PT/INR Recent Labs    08/24/20 0437  LABPROT 14.4  INR 1.2    Recent Labs  Lab 08/23/20 0220  AST 14*  ALT 14  ALKPHOS 76  BILITOT 0.9  PROT 5.1*  ALBUMIN 2.5*     Lipase  No results found for: LIPASE   Medications: . sodium chloride   Intravenous Once  . B-complex with vitamin C  1 tablet Oral Daily  . calcium carbonate  1,250 mg Oral BID WC  . cholecalciferol  1,000 Units Oral Daily  . feeding supplement  1 Container Oral TID BM  . multivitamin with minerals  1 tablet Oral Daily  . [START ON 08/27/2020] pantoprazole  40 mg Intravenous Q12H  . vitamin E  800  Units Oral Daily    Assessment/Plan Rosacea  Osteopenia/Osteoporosis Recent fall with back injury ABL anemia secondary to GI bleed with syncope  - s/p 4 units PRBC 9/26-9/27/21  UGIB secondary to duodenal ulcer - s/p EGD with epinephrine injection 9/27 - bx negative for H. Pylori  - s/p IR Coil embolization of the gastroduodenal artery 08/24/2020 - H/H:   6.6/19.4>>10.9/30.9>>11.4/32.2>> 9.9/28.6 - patient on PPI gtt - no indication for emergent surgical intervention at this time  FEN: CLD -  VTE: SCDs, no chemical prophylaxis in setting of GIB ID: none current   Plan:  Stable after transfusion, wants to get up and move around some.  Will increase activity level.  No surgical indication currently.     LOS: 4 days    Kemya Shed 08/26/2020 Please see Amion

## 2020-08-26 NOTE — Progress Notes (Signed)
MD paged for pain prn med. See [facility]

## 2020-08-26 NOTE — Progress Notes (Signed)
PROGRESS NOTE    [name]   GNF:[id number]  DOB: 01/21/1946  DOA: 08/22/2020     4  PCP: Joselyn ArrowKnapp, Eve, MD  CC: bloody stools, weakness   Hospital Course: Ms. Elizabeth Haynes is a 74 year old female with PMH of osteoporosis, osteopenia, recent rehab stay after fall with back injury who presented to the ED with complaints of progressive generalized weakness, decreased appetite, abdominal pain and syncopal episodes.    Patient's neighbor reported large, dark stool the day prior to admission and EDP noted maroon-colored stool on rectal exam which was guaiac positive.  Hemoglobin 5.6 on admission.  Admitted for symptomatic anemia due to suspected acute/subacute upper GI bleeding, acute blood loss anemia and syncope.  S/p 2 units PRBC transfusion on admission.  [facility] consulted and EGD on 9/27 revealed a large cratered duodenal ulcer, pigmentation and visible vessel, not amenable to endoscopic therapy. She then underwent coil embolization of the GDA with IR on 9/28.    Interval History:  No further maroon stools reported.  They have started turning dark.  Vitals have also remained stable.  Hemoglobin appears to also be stabilizing.  Patient has been ambulating well and seen ambulating down the hallway with physical therapy this morning.  Tentative plan is for discharging home tomorrow.  Her diet was also advanced further today.  Old records reviewed in assessment of this patient  ROS: Constitutional: negative for chills and fevers, Respiratory: negative for cough, Cardiovascular: negative for chest pain and Gastrointestinal: negative for abdominal pain  Assessment & Plan: Symptomatic anemia secondary to acute GI blood loss: Hemoglobin 10.6 on 08/03/2020.  Presented with hemoglobin of 5.6.  S/p 2 units PRBC appropriately improved to 9.1 > 8.4.  - continue trending and will transfuse further as needed - Hgb stabilizing   UGIB 2/2 duodenal ulcer - as seen on EGD on 9/27; ulcer not amenable for  treatment during EGD -Patient underwent coil embolization with IR on 08/24/2020 -Hemoglobin down to 6.2 g/dL on evening of 8/46/96299/28/2021.  Received 2 more units PRBC and hemoglobin is 10.9 g/dL after transfusion -Surgery consulted for further input in case of ongoing bleed.  Suspect this is resolving from coiling and blood still being evacuated from colon  - so far continues to be stabilizing; stools are darkening and Hgb starting to stabilize -Continue PPI (IV for now per GI then BID at d/c) -Outpatient follow-up with GI after discharge  Syncope- resolved  Suspect due to symptomatic anemia.  Telemetry shows sinus rhythm and 8 beat NS SVT.  Continue telemetry monitoring.  Will check 2D echo.  Also check orthostatic vital signs.  Patient needs to be advised that she should not drive for 6 months, needs to be done prior to discharge.  CT head with chronic changes and no acute abnormalities. TSH normal.  Acute lower UTI: Treated with Ceftin as outpatient without relief of symptoms.  Symptoms then started to improve with Cipro.  Completing 3-day course.  Dehydration with hyponatremia- resolved  Serum sodium 128 on admission.  Falls: Related to weakness from symptomatic anemia and syncope.  PT recommends home health PT.  Body mass index is 17.38 kg/m.  Antimicrobials: Cipro 08/22/2020>> 08/23/2020  DVT prophylaxis: SCD Code Status: DNR Family Communication: Sister bedside Disposition Plan: Status is: Inpatient  Remains inpatient appropriate because:Unsafe d/c plan, IV treatments appropriate due to intensity of illness or inability to take PO and Inpatient level of care appropriate due to severity of illness   Dispo: The patient is from: Home  Anticipated d/c is to: Home              Anticipated d/c date is: 1 day              Patient currently is not medically stable to d/c.  Objective: Blood pressure (!) 145/71, pulse 78, temperature 99.5 F (37.5 C), temperature  source Oral, resp. rate 12, height 5\' 2"  (1.575 m), weight 43.1 kg, SpO2 98 %.  Examination: General appearance: alert, cooperative and no distress Head: Normocephalic, without obvious abnormality Eyes: EOMI Lungs: clear to auscultation bilaterally Heart: regular rate and rhythm and S1, S2 normal Abdomen: normal findings: bowel sounds normal and soft, non-tender Extremities: No edema Skin: mobility and turgor normal Neurologic: Grossly normal  Consultants:   GI  IR  Procedures:   EGD 08/23/2020  IR coil embolization of GDA, 08/24/2020  Data Reviewed: I have personally reviewed following labs and imaging studies Results for orders placed or performed during the hospital encounter of 08/22/20 (from the past 24 hour(s))  Hemoglobin and hematocrit, blood     Status: Abnormal   Collection Time: 08/25/20  6:01 PM  Result Value Ref Range   Hemoglobin 11.4 (L) 12.0 - 15.0 g/dL   HCT 08/27/20 (L) 36 - 46 %  Basic metabolic panel     Status: Abnormal   Collection Time: 08/26/20 12:34 AM  Result Value Ref Range   Sodium 129 (L) 135 - 145 mmol/L   Potassium 3.5 3.5 - 5.1 mmol/L   Chloride 96 (L) 98 - 111 mmol/L   CO2 24 22 - 32 mmol/L   Glucose, Bld 106 (H) 70 - 99 mg/dL   BUN 15 8 - 23 mg/dL   Creatinine, Ser 08/28/20 (L) 0.44 - 1.00 mg/dL   Calcium 8.1 (L) 8.9 - 10.3 mg/dL   GFR calc non Af Amer >60 >60 mL/min   GFR calc Af Amer >60 >60 mL/min   Anion gap 9 5 - 15  CBC with Differential/Platelet     Status: Abnormal   Collection Time: 08/26/20 12:34 AM  Result Value Ref Range   WBC 11.2 (H) 4.0 - 10.5 K/uL   RBC 3.15 (L) 3.87 - 5.11 MIL/uL   Hemoglobin 9.9 (L) 12.0 - 15.0 g/dL   HCT 08/28/20 (L) 36 - 46 %   MCV 90.8 80.0 - 100.0 fL   MCH 31.4 26.0 - 34.0 pg   MCHC 34.6 30.0 - 36.0 g/dL   RDW 94.8 (H) 54.6 - 27.0 %   Platelets 197 150 - 400 K/uL   nRBC 0.0 0.0 - 0.2 %   Neutrophils Relative % 74 %   Neutro Abs 8.3 (H) 1.7 - 7.7 K/uL   Lymphocytes Relative 13 %   Lymphs Abs 1.4 0.7  - 4.0 K/uL   Monocytes Relative 9 %   Monocytes Absolute 1.0 0 - 1 K/uL   Eosinophils Relative 3 %   Eosinophils Absolute 0.3 0 - 0 K/uL   Basophils Relative 0 %   Basophils Absolute 0.0 0 - 0 K/uL   Immature Granulocytes 1 %   Abs Immature Granulocytes 0.13 (H) 0.00 - 0.07 K/uL  Magnesium     Status: None   Collection Time: 08/26/20 12:34 AM  Result Value Ref Range   Magnesium 2.1 1.7 - 2.4 mg/dL    Recent Results (from the past 240 hour(s))  Respiratory Panel by RT PCR (Flu A&B, Covid) - Nasopharyngeal Swab     Status: None   Collection Time: 08/22/20  12:23 PM   Specimen: Nasopharyngeal Swab  Result Value Ref Range Status   SARS Coronavirus 2 by RT PCR NEGATIVE NEGATIVE Final    Comment: (NOTE) SARS-CoV-2 target nucleic acids are NOT DETECTED.  The SARS-CoV-2 RNA is generally detectable in upper respiratoy specimens during the acute phase of infection. The lowest concentration of SARS-CoV-2 viral copies this assay can detect is 131 copies/mL. A negative result does not preclude SARS-Cov-2 infection and should not be used as the sole basis for treatment or other patient management decisions. A negative result may occur with  improper specimen collection/handling, submission of specimen other than nasopharyngeal swab, presence of viral mutation(s) within the areas targeted by this assay, and inadequate number of viral copies (<131 copies/mL). A negative result must be combined with clinical observations, patient history, and epidemiological information. The expected result is Negative.  Fact Sheet for Patients:  https://www.moore.com/  Fact Sheet for Healthcare Providers:  https://www.young.biz/  This test is no t yet approved or cleared by the [place] FDA and  has been authorized for detection and/or diagnosis of SARS-CoV-2 by FDA under an Emergency Use Authorization (EUA). This EUA will remain  in effect (meaning this test can  be used) for the duration of the COVID-19 declaration under Section 564(b)(1) of the Act, 21 U.S.C. section 360bbb-3(b)(1), unless the authorization is terminated or revoked sooner.     Influenza A by PCR NEGATIVE NEGATIVE Final   Influenza B by PCR NEGATIVE NEGATIVE Final    Comment: (NOTE) The Xpert Xpress SARS-CoV-2/FLU/RSV assay is intended as an aid in  the diagnosis of influenza from Nasopharyngeal swab specimens and  should not be used as a sole basis for treatment. Nasal washings and  aspirates are unacceptable for Xpert Xpress SARS-CoV-2/FLU/RSV  testing.  Fact Sheet for Patients: https://www.moore.com/  Fact Sheet for Healthcare Providers: https://www.young.biz/  This test is not yet approved or cleared by the [place] FDA and  has been authorized for detection and/or diagnosis of SARS-CoV-2 by  FDA under an Emergency Use Authorization (EUA). This EUA will remain  in effect (meaning this test can be used) for the duration of the  Covid-19 declaration under Section 564(b)(1) of the Act, 21  U.S.C. section 360bbb-3(b)(1), unless the authorization is  terminated or revoked. Performed at [facility], [address] [place], [place] [address]   Urine culture     Status: Abnormal   Collection Time: 08/23/20 11:08 AM   Specimen: Urine, Random  Result Value Ref Range Status   Specimen Description   Final    URINE, RANDOM Performed at [facility], [address], [place], [place] [address]    Special Requests   Final    NONE Performed at [facility], [address] [place], [place] [address]    Culture (A)  Final    <10,000 COLONIES/mL INSIGNIFICANT GROWTH Performed at [facility], [address] [place], [place] [address]    Report Status 08/24/2020 FINAL  Final     Radiology Studies: ECHOCARDIOGRAM COMPLETE  Result Date: 08/25/2020    ECHOCARDIOGRAM  REPORT   Patient Name:   Elizabeth Haynes Date of Exam: 08/25/2020 Medical Rec #:  [id number]     Height:       62.0 in Accession #:    [id number]    Weight:       95.0 lb Date of Birth:  [birth year]    BSA:  1.393 m Patient Age:    73 years      BP:           116/76 mmHg Patient Gender: F             HR:           74 bpm. Exam Location:  Inpatient Procedure: 2D Echo Indications:    780.2 syncope  History:        Patient has no prior history of Echocardiogram examinations.  Sonographer:    Celene Skeen RDCS (AE) Referring Phys: [id number] ANAND D HONGALGI IMPRESSIONS  1. Left ventricular ejection fraction, by estimation, is 60 to 65%. The left ventricle has normal function. The left ventricle has no regional wall motion abnormalities. Left ventricular diastolic parameters are consistent with Grade II diastolic dysfunction (pseudonormalization).  2. Right ventricular systolic function is normal. The right ventricular size is normal.  3. The mitral valve is normal in structure. Mild mitral valve regurgitation. No evidence of mitral stenosis.  4. The aortic valve is normal in structure. Aortic valve regurgitation is not visualized. No aortic stenosis is present.  5. The inferior vena cava is normal in size with greater than 50% respiratory variability, suggesting right atrial pressure of 3 mmHg. FINDINGS  Left Ventricle: Left ventricular ejection fraction, by estimation, is 60 to 65%. The left ventricle has normal function. The left ventricle has no regional wall motion abnormalities. The left ventricular internal cavity size was normal in size. There is  no left ventricular hypertrophy. Left ventricular diastolic parameters are consistent with Grade II diastolic dysfunction (pseudonormalization). Right Ventricle: The right ventricular size is normal. No increase in right ventricular wall thickness. Right ventricular systolic function is normal. Left Atrium: Left atrial size was normal in size. Right Atrium: Right atrial  size was normal in size. Pericardium: There is no evidence of pericardial effusion. Mitral Valve: The mitral valve is normal in structure. Mild mitral valve regurgitation. No evidence of mitral valve stenosis. Tricuspid Valve: The tricuspid valve is normal in structure. Tricuspid valve regurgitation is not demonstrated. No evidence of tricuspid stenosis. Aortic Valve: The aortic valve is normal in structure. Aortic valve regurgitation is not visualized. No aortic stenosis is present. Pulmonic Valve: The pulmonic valve was normal in structure. Pulmonic valve regurgitation is not visualized. No evidence of pulmonic stenosis. Aorta: The aortic root is normal in size and structure. Venous: The inferior vena cava is normal in size with greater than 50% respiratory variability, suggesting right atrial pressure of 3 mmHg. IAS/Shunts: No atrial level shunt detected by color flow Doppler.  LEFT VENTRICLE PLAX 2D LVIDd:         4.20 cm  Diastology LVIDs:         2.20 cm  LV e' medial:    7.18 cm/s LV PW:         0.80 cm  LV E/e' medial:  11.3 LV IVS:        0.60 cm  LV e' lateral:   10.80 cm/s LVOT diam:     1.80 cm  LV E/e' lateral: 7.5 LV SV:         46 LV SV Index:   33 LVOT Area:     2.54 cm  RIGHT VENTRICLE RV S prime:     16.30 cm/s TAPSE (M-mode): 2.3 cm LEFT ATRIUM             Index       RIGHT ATRIUM  Index LA diam:        2.60 cm 1.87 cm/m  RA Area:     10.80 cm LA Vol (A2C):   26.5 ml 19.02 ml/m RA Volume:   20.60 ml  14.78 ml/m LA Vol (A4C):   22.4 ml 16.08 ml/m LA Biplane Vol: 25.2 ml 18.09 ml/m  AORTIC VALVE LVOT Vmax:   88.80 cm/s LVOT Vmean:  65.100 cm/s LVOT VTI:    0.182 m  AORTA Ao Root diam: 2.60 cm MITRAL VALVE MV Area (PHT): 3.85 cm    SHUNTS MV Decel Time: 197 msec    Systemic VTI:  0.18 m MV E velocity: 81.40 cm/s  Systemic Diam: 1.80 cm MV A velocity: 66.00 cm/s MV E/A ratio:  1.23 Donato Schultz MD Electronically signed by Donato Schultz MD Signature Date/Time: 08/25/2020/2:41:28 PM     Final    IR Angiogram Visceral Selective  Final Result    IR US Guide Vasc Access Right  Final Result    IR Angiogram Selective Each Additional Vessel  Final Result    IR Angiogram Visceral Selective  Final Result    IR EMBO ART  VEN HEMORR LYMPH EXTRAV  INC GUIDE ROADMAPPING  Final Result    CT Head Wo Contrast  Final Result    DG Chest 2 View  Final Result      Scheduled Meds: . sodium chloride   Intravenous Once  . B-complex with vitamin C  1 tablet Oral Daily  . calcium carbonate  1,250 mg Oral BID WC  . cholecalciferol  1,000 Units Oral Daily  . feeding supplement  1 Container Oral TID BM  . multivitamin with minerals  1 tablet Oral Daily  . [START ON 08/27/2020] pantoprazole  40 mg Intravenous Q12H  . vitamin E  800 Units Oral Daily   PRN Meds: acetaminophen **OR** acetaminophen, iohexol, melatonin, olopatadine, ondansetron **OR** ondansetron (ZOFRAN) IV Continuous Infusions: . pantoprozole (PROTONIX) infusion 8 mg/hr (08/25/20 1447)      LOS: 4 days  Time spent: Greater than 50% of the 35 minute visit was spent in counseling/coordination of care for the patient as laid out in the A&P.   Lewie Chamber, MD [facility] 08/26/2020, 4:44 PM  Contact via secure chat.  To contact the attending provider between 7A-7P or the covering provider during after hours 7P-7A, please log into the web site [email or web address] and access using universal [place] password for that web site. If you do not have the password, please call the hospital operator.

## 2020-08-26 NOTE — Progress Notes (Signed)
Subjective: Patient reports having 3 episodes of black tarry stools overnight. As per her nurse stools are darker and not maroon-colored or bloody anymore, She is requesting for diet to be advanced. Complains of increased abdominal sounds but denies abdominal pain, nausea or vomiting.  Objective: Vital signs in last 24 hours: Temp:  [98.3 F (36.8 C)-98.4 F (36.9 C)] 98.3 F (36.8 C) (09/30 0640) Pulse Rate:  [67-74] 67 (09/30 0640) Resp:  [14-15] 15 (09/30 0640) BP: (109-116)/(76-87) 109/87 (09/30 0640) SpO2:  [100 %] 100 % (09/30 0640) Weight change:  Last BM Date: 08/26/20  PE: Thinly built, mild pallor GENERAL: Not in distress, able to speak in full sentences, on room air  ABDOMEN: Soft, increased bowel sounds, nontender, nondistended EXTREMITIES: No deformity, no edema  Lab Results: Results for orders placed or performed during the hospital encounter of 08/22/20 (from the past 48 hour(s))  CBC     Status: Abnormal   Collection Time: 08/24/20  2:12 PM  Result Value Ref Range   WBC 13.4 (H) 4.0 - 10.5 K/uL   RBC 2.28 (L) 3.87 - 5.11 MIL/uL   Hemoglobin 7.5 (L) 12.0 - 15.0 g/dL   HCT 86.7 (L) 36 - 46 %   MCV 93.4 80.0 - 100.0 fL   MCH 32.9 26.0 - 34.0 pg   MCHC 35.2 30.0 - 36.0 g/dL   RDW 61.9 50.9 - 32.6 %   Platelets 213 150 - 400 K/uL   nRBC 0.2 0.0 - 0.2 %    Comment: Performed at [facility], [address], [place], [place] [address]  CBC     Status: Abnormal   Collection Time: 08/24/20  9:01 PM  Result Value Ref Range   WBC 13.5 (H) 4.0 - 10.5 K/uL   RBC 1.93 (L) 3.87 - 5.11 MIL/uL   Hemoglobin 6.2 (LL) 12.0 - 15.0 g/dL    Comment: This critical result has verified and been called to [facility]. RN by Jani Gravel on 09 28 2021 at 2117, and has been read back. CRITICAL RESULT VERIFIED   HCT 18.2 (L) 36 - 46 %   MCV 94.3 80.0 - 100.0 fL   MCH 32.1 26.0 - 34.0 pg   MCHC 34.1 30.0 - 36.0 g/dL   RDW 80.9 98.3 - 38.2 %   Platelets 187 150  - 400 K/uL   nRBC 0.1 0.0 - 0.2 %    Comment: Performed at [facility], [address] [place], [place] [address]  Prepare RBC (crossmatch)     Status: None   Collection Time: 08/24/20  9:42 PM  Result Value Ref Range   Order Confirmation      ORDER PROCESSED BY BLOOD BANK Performed at [facility], [address], [place], [place] [address]   Basic metabolic panel     Status: Abnormal   Collection Time: 08/25/20  8:25 AM  Result Value Ref Range   Sodium 129 (L) 135 - 145 mmol/L   Potassium 3.2 (L) 3.5 - 5.1 mmol/L   Chloride 97 (L) 98 - 111 mmol/L   CO2 25 22 - 32 mmol/L   Glucose, Bld 127 (H) 70 - 99 mg/dL    Comment: Glucose reference range applies only to samples taken after fasting for at least 8 hours.   BUN 27 (H) 8 - 23 mg/dL   Creatinine, Ser 1.93 0.44 - 1.00 mg/dL   Calcium 7.8 (L) 8.9 - 10.3 mg/dL   GFR calc non Af  Amer >60 >60 mL/min   GFR calc Af Amer >60 >60 mL/min   Anion gap 7 5 - 15    Comment: Performed at [facility], [address], [place], [place] [address]  CBC with Differential/Platelet     Status: Abnormal   Collection Time: 08/25/20  8:25 AM  Result Value Ref Range   WBC 13.3 (H) 4.0 - 10.5 K/uL   RBC 3.43 (L) 3.87 - 5.11 MIL/uL   Hemoglobin 10.9 (L) 12.0 - 15.0 g/dL    Comment: REPEATED TO VERIFY POST TRANSFUSION SPECIMEN DELTA CHECK NOTED    HCT 30.9 (L) 36 - 46 %   MCV 90.1 80.0 - 100.0 fL   MCH 31.8 26.0 - 34.0 pg   MCHC 35.3 30.0 - 36.0 g/dL   RDW 47.0 (H) 96.2 - 83.6 %   Platelets 189 150 - 400 K/uL   nRBC 0.0 0.0 - 0.2 %   Neutrophils Relative % 74 %   Neutro Abs 9.9 (H) 1.7 - 7.7 K/uL   Lymphocytes Relative 12 %   Lymphs Abs 1.6 0.7 - 4.0 K/uL   Monocytes Relative 9 %   Monocytes Absolute 1.2 (H) 0 - 1 K/uL   Eosinophils Relative 3 %   Eosinophils Absolute 0.4 0 - 0 K/uL   Basophils Relative 0 %   Basophils Absolute 0.0 0 - 0 K/uL   Immature Granulocytes 2 %   Abs  Immature Granulocytes 0.24 (H) 0.00 - 0.07 K/uL    Comment: Performed at [facility], [address], [place], [place] [address]  Magnesium     Status: None   Collection Time: 08/25/20  8:25 AM  Result Value Ref Range   Magnesium 2.0 1.7 - 2.4 mg/dL    Comment: Performed at [facility], [address], [place], [place] [address]  Hemoglobin and hematocrit, blood     Status: Abnormal   Collection Time: 08/25/20  6:01 PM  Result Value Ref Range   Hemoglobin 11.4 (L) 12.0 - 15.0 g/dL   HCT 03.5 (L) 36 - 46 %    Comment: Performed at [facility], [address], [place], [place] [address]  Basic metabolic panel     Status: Abnormal   Collection Time: 08/26/20 12:34 AM  Result Value Ref Range   Sodium 129 (L) 135 - 145 mmol/L   Potassium 3.5 3.5 - 5.1 mmol/L   Chloride 96 (L) 98 - 111 mmol/L   CO2 24 22 - 32 mmol/L   Glucose, Bld 106 (H) 70 - 99 mg/dL    Comment: Glucose reference range applies only to samples taken after fasting for at least 8 hours.   BUN 15 8 - 23 mg/dL   Creatinine, Ser 1.27 (L) 0.44 - 1.00 mg/dL   Calcium 8.1 (L) 8.9 - 10.3 mg/dL   GFR calc non Af Amer >60 >60 mL/min   GFR calc Af Amer >60 >60 mL/min   Anion gap 9 5 - 15    Comment: Performed at [facility], [address] [place], [place] [address]  CBC with Differential/Platelet     Status: Abnormal   Collection Time: 08/26/20 12:34 AM  Result Value Ref Range   WBC 11.2 (H) 4.0 - 10.5 K/uL   RBC 3.15 (L) 3.87 - 5.11 MIL/uL   Hemoglobin 9.9 (L) 12.0 - 15.0 g/dL   HCT 17.4 (L) 36 - 46 %   MCV 90.8 80.0 - 100.0 fL  MCH 31.4 26.0 - 34.0 pg   MCHC 34.6 30.0 - 36.0 g/dL   RDW 09.3 (H) 23.5 - 57.3 %   Platelets 197 150 - 400 K/uL   nRBC 0.0 0.0 - 0.2 %   Neutrophils Relative % 74 %   Neutro Abs 8.3 (H) 1.7 - 7.7 K/uL   Lymphocytes Relative 13 %   Lymphs Abs 1.4 0.7 - 4.0 K/uL   Monocytes Relative 9 %   Monocytes Absolute 1.0 0  - 1 K/uL   Eosinophils Relative 3 %   Eosinophils Absolute 0.3 0 - 0 K/uL   Basophils Relative 0 %   Basophils Absolute 0.0 0 - 0 K/uL   Immature Granulocytes 1 %   Abs Immature Granulocytes 0.13 (H) 0.00 - 0.07 K/uL    Comment: Performed at [facility], [address] [place], [place] [address]  Magnesium     Status: None   Collection Time: 08/26/20 12:34 AM  Result Value Ref Range   Magnesium 2.1 1.7 - 2.4 mg/dL    Comment: Performed at [facility], [address] [place], [place] [address]    Studies/Results: ECHOCARDIOGRAM COMPLETE  Result Date: 08/25/2020    ECHOCARDIOGRAM REPORT   Patient Name:   Elizabeth Haynes Date of Exam: 08/25/2020 Medical Rec #:  [id number]     Height:       62.0 in Accession #:    [id number]    Weight:       95.0 lb Date of Birth:  1946-07-01    BSA:          1.393 m Patient Age:    73 years      BP:           116/76 mmHg Patient Gender: F             HR:           74 bpm. Exam Location:  Inpatient Procedure: 2D Echo Indications:    780.2 syncope  History:        Patient has no prior history of Echocardiogram examinations.  Sonographer:    Celene Skeen RDCS (AE) Referring Phys: [id number] ANAND D HONGALGI IMPRESSIONS  1. Left ventricular ejection fraction, by estimation, is 60 to 65%. The left ventricle has normal function. The left ventricle has no regional wall motion abnormalities. Left ventricular diastolic parameters are consistent with Grade II diastolic dysfunction (pseudonormalization).  2. Right ventricular systolic function is normal. The right ventricular size is normal.  3. The mitral valve is normal in structure. Mild mitral valve regurgitation. No evidence of mitral stenosis.  4. The aortic valve is normal in structure. Aortic valve regurgitation is not visualized. No aortic stenosis is present.  5. The inferior vena cava is normal in size with greater than 50% respiratory variability, suggesting right atrial pressure of  3 mmHg. FINDINGS  Left Ventricle: Left ventricular ejection fraction, by estimation, is 60 to 65%. The left ventricle has normal function. The left ventricle has no regional wall motion abnormalities. The left ventricular internal cavity size was normal in size. There is  no left ventricular hypertrophy. Left ventricular diastolic parameters are consistent with Grade II diastolic dysfunction (pseudonormalization). Right Ventricle: The right ventricular size is normal. No increase in right ventricular wall thickness. Right ventricular systolic function is normal. Left Atrium: Left atrial size was normal in size. Right Atrium: Right atrial size was normal in size. Pericardium: There is no evidence of pericardial effusion. Mitral  Valve: The mitral valve is normal in structure. Mild mitral valve regurgitation. No evidence of mitral valve stenosis. Tricuspid Valve: The tricuspid valve is normal in structure. Tricuspid valve regurgitation is not demonstrated. No evidence of tricuspid stenosis. Aortic Valve: The aortic valve is normal in structure. Aortic valve regurgitation is not visualized. No aortic stenosis is present. Pulmonic Valve: The pulmonic valve was normal in structure. Pulmonic valve regurgitation is not visualized. No evidence of pulmonic stenosis. Aorta: The aortic root is normal in size and structure. Venous: The inferior vena cava is normal in size with greater than 50% respiratory variability, suggesting right atrial pressure of 3 mmHg. IAS/Shunts: No atrial level shunt detected by color flow Doppler.  LEFT VENTRICLE PLAX 2D LVIDd:         4.20 cm  Diastology LVIDs:         2.20 cm  LV e' medial:    7.18 cm/s LV PW:         0.80 cm  LV E/e' medial:  11.3 LV IVS:        0.60 cm  LV e' lateral:   10.80 cm/s LVOT diam:     1.80 cm  LV E/e' lateral: 7.5 LV SV:         46 LV SV Index:   33 LVOT Area:     2.54 cm  RIGHT VENTRICLE RV S prime:     16.30 cm/s TAPSE (M-mode): 2.3 cm LEFT ATRIUM             Index        RIGHT ATRIUM           Index LA diam:        2.60 cm 1.87 cm/m  RA Area:     10.80 cm LA Vol (A2C):   26.5 ml 19.02 ml/m RA Volume:   20.60 ml  14.78 ml/m LA Vol (A4C):   22.4 ml 16.08 ml/m LA Biplane Vol: 25.2 ml 18.09 ml/m  AORTIC VALVE LVOT Vmax:   88.80 cm/s LVOT Vmean:  65.100 cm/s LVOT VTI:    0.182 m  AORTA Ao Root diam: 2.60 cm MITRAL VALVE MV Area (PHT): 3.85 cm    SHUNTS MV Decel Time: 197 msec    Systemic VTI:  0.18 m MV E velocity: 81.40 cm/s  Systemic Diam: 1.80 cm MV A velocity: 66.00 cm/s MV E/A ratio:  1.23 Donato SchultzMark Skains MD Electronically signed by Donato SchultzMark Skains MD Signature Date/Time: 08/25/2020/2:41:28 PM    Final     Medications: I have reviewed the patient's current medications.  Assessment: Large cratered duodenal bulb ulcer with visible vessel and pigmentation, treated with epinephrine injection at the periphery and IR guided embolization of GDA Although hemoglobin is dropped from 11.4 to 9.9 I believe this is hemodilutional/equilibrium related Patient remains hemodynamically stable BUN trending down and 15 today  Plan: Advance diet to regular Encourage mobilization Possibly discharge in a.m. if there is no further evidence of bleeding Continue IV Protonix for now, plan to switch to PPI twice daily which needs to be continued as an outpatient for the next 2 months. No evidence of H. pylori on biopsy, advised patient to avoid NSAIDs and aspirin. Recommend outpatient endoscopy in 2 months to document ulcer healing.  Kerin SalenArya Jacion Dismore, MD 08/26/2020, 12:50 PM

## 2020-08-27 LAB — BASIC METABOLIC PANEL
Anion gap: 9 (ref 5–15)
BUN: 8 mg/dL (ref 8–23)
CO2: 26 mmol/L (ref 22–32)
Calcium: 8 mg/dL — ABNORMAL LOW (ref 8.9–10.3)
Chloride: 97 mmol/L — ABNORMAL LOW (ref 98–111)
Creatinine, Ser: 0.4 mg/dL — ABNORMAL LOW (ref 0.44–1.00)
GFR calc Af Amer: >60 mL/min (ref >60)
GFR calc non Af Amer: >60 mL/min (ref >60)
Glucose, Bld: 126 mg/dL — ABNORMAL HIGH (ref 70–99)
Potassium: 3.1 mmol/L — ABNORMAL LOW (ref 3.5–5.1)
Sodium: 132 mmol/L — ABNORMAL LOW (ref 135–145)

## 2020-08-27 LAB — MAGNESIUM: Magnesium: 2.1 mg/dL (ref 1.7–2.4)

## 2020-08-27 LAB — CBC WITH DIFFERENTIAL/PLATELET
Abs Immature Granulocytes: 0.06 10*3/uL (ref 0.00–0.07)
Basophils Absolute: 0 10*3/uL (ref 0.0–0.1)
Basophils Relative: 0 %
Eosinophils Absolute: 0.2 10*3/uL (ref 0.0–0.5)
Eosinophils Relative: 3 %
HCT: 31.4 % — ABNORMAL LOW (ref 36.0–46.0)
Hemoglobin: 10.9 g/dL — ABNORMAL LOW (ref 12.0–15.0)
Immature Granulocytes: 1 %
Lymphocytes Relative: 18 %
Lymphs Abs: 1.7 10*3/uL (ref 0.7–4.0)
MCH: 31.6 pg (ref 26.0–34.0)
MCHC: 34.7 g/dL (ref 30.0–36.0)
MCV: 91 fL (ref 80.0–100.0)
Monocytes Absolute: 0.9 10*3/uL (ref 0.1–1.0)
Monocytes Relative: 10 %
Neutro Abs: 6.3 10*3/uL (ref 1.7–7.7)
Neutrophils Relative %: 68 %
Platelets: 261 10*3/uL (ref 150–400)
RBC: 3.45 MIL/uL — ABNORMAL LOW (ref 3.87–5.11)
RDW: 16.2 % — ABNORMAL HIGH (ref 11.5–15.5)
WBC: 9.1 10*3/uL (ref 4.0–10.5)
nRBC: 0 % (ref 0.0–0.2)

## 2020-08-27 LAB — URINALYSIS, ROUTINE W REFLEX MICROSCOPIC
Bilirubin Urine: NEGATIVE
Glucose, UA: 50 mg/dL — AB
Hgb urine dipstick: NEGATIVE
Ketones, ur: NEGATIVE mg/dL
Leukocytes,Ua: NEGATIVE
Nitrite: NEGATIVE
Protein, ur: NEGATIVE mg/dL
Specific Gravity, Urine: 1.006 (ref 1.005–1.030)
pH: 7 (ref 5.0–8.0)

## 2020-08-27 MED ORDER — PANTOPRAZOLE SODIUM 40 MG PO TBEC: 40.0000 mg | DELAYED_RELEASE_TABLET | Freq: Two times a day (BID) | ORAL | 3 refills | Status: DC

## 2020-08-27 MED ORDER — TRAMADOL HCL 50 MG PO TABS
50.0000 mg | ORAL_TABLET | Freq: Two times a day (BID) | ORAL | Status: AC | PRN
  Administered 2020-08-27: 50 mg via ORAL
  Filled 2020-08-27: qty 1

## 2020-08-27 MED ORDER — POTASSIUM CHLORIDE CRYS ER 20 MEQ PO TBCR
40.0000 meq | EXTENDED_RELEASE_TABLET | Freq: Once | ORAL | Status: AC
  Administered 2020-08-27: 40 meq via ORAL
  Filled 2020-08-27: qty 2

## 2020-08-27 NOTE — Discharge Summary (Signed)
Physician Discharge Summary  Elizabeth Haynes:[id number] DOB: [birth year] DOA: 08/22/2020  PCP: Joselyn Arrow, MD  Admit date: 08/22/2020 Discharge date: 08/27/2020  Admitted From: Home Disposition:  Home Discharging physician: Lewie Chamber, MD  Recommendations for Outpatient Follow-up:  1. Follow up with GI  Home Health: PT  Patient discharged to home in Discharge Condition: stable CODE STATUS: DNR Diet recommendation:  Diet Orders (From admission, onward)    Start     Ordered   08/27/20 0000  Diet general        08/27/20 1000   08/26/20 1213  Diet regular Room service appropriate? Yes; Fluid consistency: Thin  Diet effective now       Question Answer Comment  Room service appropriate? Yes   Fluid consistency: Thin      08/26/20 1212          Hospital Course: [name] is a 74 year old female with PMH of osteoporosis, osteopenia, recent rehab stay after fall with back injury who presented to the ED with complaints of progressive generalized weakness, decreased appetite, abdominal pain and syncopal episodes.    Patient's neighbor reported large, dark stool the day prior to admission and EDP noted maroon-colored stool on rectal exam which was guaiac positive.  Hemoglobin 5.6 on admission.  Admitted for symptomatic anemia due to suspected acute/subacute upper GI bleeding, acute blood loss anemia and syncope.  S/p 2 units PRBC transfusion on admission.  [facility] consulted and EGD on 9/27 revealed a large cratered duodenal ulcer, pigmentation and visible vessel, not amenable to endoscopic therapy. She then underwent coil embolization of the GDA with IR on 9/28.  She continued to be monitored in the hospital with hemoglobin trending. Ultimately her hemoglobin levels stabilized and stools transitioned from red/maroon to dark, consistent with resolving underlying bleed. Surgery had also been consulted after embolization due to drop in hemoglobin, but patient stabilized as noted and did not  require any surgical intervention. She was considered stable for discharging home with home health physical therapy.   No new Assessment & Plan notes have been filed under this hospital service since the last note was generated. Service: Hospitalist   The patient's chronic medical conditions were treated accordingly per the patient's home medication regimen except as noted.  On day of discharge, patient was felt deemed stable for discharge. Patient/family member advised to call PCP or come back to ER if needed.   Discharge Diagnoses:   Principal Diagnosis: <principal problem not specified>  Active Hospital Problems   Diagnosis Date Noted  . Malnutrition of moderate degree 08/25/2020  . GIB (gastrointestinal bleeding) 08/22/2020    Resolved Hospital Problems  No resolved problems to display.    Discharge Instructions    Diet general   Complete by: As directed    Increase activity slowly   Complete by: As directed    No wound care   Complete by: As directed      Allergies as of 08/27/2020      Reactions   Tramadol Other (See Comments)   confusion   Bactrim [sulfamethoxazole-trimethoprim] Hives      Medication List    STOP taking these medications   ciprofloxacin 500 MG tablet Commonly known as: Cipro     TAKE these medications   b complex vitamins capsule Take 1 capsule by mouth daily.   calcium carbonate 1250 MG capsule Take 1,250 mg by mouth 2 (two) times daily with a meal.   cholecalciferol 1000 units tablet Commonly known as: VITAMIN  D Take 1,000 Units by mouth daily.   FINACEA EX Apply topically.   HYDROcodone-acetaminophen 5-325 MG tablet Commonly known as: NORCO/VICODIN Take 1 tablet by mouth 4 (four) times daily as needed for pain.   multivitamin with minerals tablet Take 1 tablet by mouth daily.   pantoprazole 40 MG tablet Commonly known as: Protonix Take 1 tablet (40 mg total) by mouth 2 (two) times daily.   Pataday 0.1 % ophthalmic  solution Generic drug: olopatadine Place 1 drop into both eyes daily as needed for allergies.   vitamin E 1000 UNIT capsule Take 1,000 Units by mouth daily.       Allergies  Allergen Reactions  . Tramadol Other (See Comments)    confusion  . Bactrim [Sulfamethoxazole-Trimethoprim] Hives    Consultations: GI Surgery IR  Discharge Exam: BP 135/74   Pulse 71   Temp 98.1 F (36.7 C) (Oral)   Resp 20   Ht 5\' 2"  (1.575 m)   Wt 43.1 kg   SpO2 98%   BMI 17.38 kg/m  General appearance: alert, cooperative and no distress Head: Normocephalic, without obvious abnormality Eyes: EOMI Lungs: clear to auscultation bilaterally Heart: regular rate and rhythm and S1, S2 normal Abdomen: normal findings: bowel sounds normal and soft, non-tender Extremities: No edema Skin: mobility and turgor normal Neurologic: Grossly normal  The results of significant diagnostics from this hospitalization (including imaging, microbiology, ancillary and laboratory) are listed below for reference.   Microbiology: Recent Results (from the past 240 hour(s))  Respiratory Panel by RT PCR (Flu A&B, Covid) - Nasopharyngeal Swab     Status: None   Collection Time: 08/22/20 12:23 PM   Specimen: Nasopharyngeal Swab  Result Value Ref Range Status   SARS Coronavirus 2 by RT PCR NEGATIVE NEGATIVE Final    Comment: (NOTE) SARS-CoV-2 target nucleic acids are NOT DETECTED.  The SARS-CoV-2 RNA is generally detectable in upper respiratoy specimens during the acute phase of infection. The lowest concentration of SARS-CoV-2 viral copies this assay can detect is 131 copies/mL. A negative result does not preclude SARS-Cov-2 infection and should not be used as the sole basis for treatment or other patient management decisions. A negative result may occur with  improper specimen collection/handling, submission of specimen other than nasopharyngeal swab, presence of viral mutation(s) within the areas targeted by this  assay, and inadequate number of viral copies (<131 copies/mL). A negative result must be combined with clinical observations, patient history, and epidemiological information. The expected result is Negative.  Fact Sheet for Patients:  08/24/20  Fact Sheet for Healthcare Providers:  https://www.moore.com/  This test is no t yet approved or cleared by the https://www.young.biz/ FDA and  has been authorized for detection and/or diagnosis of SARS-CoV-2 by FDA under an Emergency Use Authorization (EUA). This EUA will remain  in effect (meaning this test can be used) for the duration of the COVID-19 declaration under Section 564(b)(1) of the Act, 21 U.S.C. section 360bbb-3(b)(1), unless the authorization is terminated or revoked sooner.     Influenza A by PCR NEGATIVE NEGATIVE Final   Influenza B by PCR NEGATIVE NEGATIVE Final    Comment: (NOTE) The Xpert Xpress SARS-CoV-2/FLU/RSV assay is intended as an aid in  the diagnosis of influenza from Nasopharyngeal swab specimens and  should not be used as a sole basis for treatment. Nasal washings and  aspirates are unacceptable for Xpert Xpress SARS-CoV-2/FLU/RSV  testing.  Fact Sheet for Patients: [place]  Fact Sheet for Healthcare Providers: https://www.moore.com/  This  test is not yet approved or cleared by the [place] and  has been authorized for detection and/or diagnosis of SARS-CoV-2 by  FDA under an Emergency Use Authorization (EUA). This EUA will remain  in effect (meaning this test can be used) for the duration of the  Covid-19 declaration under Section 564(b)(1) of the Act, 21  U.S.C. section 360bbb-3(b)(1), unless the authorization is  terminated or revoked. Performed at [facility], [address], [place], [place] [address]   Urine culture     Status: Abnormal   Collection Time: 08/23/20 11:08  AM   Specimen: Urine, Random  Result Value Ref Range Status   Specimen Description   Final    URINE, RANDOM Performed at [facility], [address], [place], [place] [address]    Special Requests   Final    NONE Performed at [facility], [address], [place], [place] [address]    Culture (A)  Final    <10,000 COLONIES/mL INSIGNIFICANT GROWTH Performed at [facility], [address] [place], [place] [address]    Report Status 08/24/2020 FINAL  Final     Labs: BNP (last 3 results) No results for input(s): BNP in the last 8760 hours. Basic Metabolic Panel: Recent Labs  Lab 08/23/20 0220 08/24/20 0437 08/25/20 0825 08/26/20 0034 08/27/20 0559  NA 131* 132* 129* 129* 132*  K 3.7 3.6 3.2* 3.5 3.1*  CL 100 100 97* 96* 97*  CO2 24 26 25 24 26   GLUCOSE 106* 120* 127* 106* 126*  BUN 27* 32* 27* 15 8  CREATININE 0.44 0.45 0.45 0.38* 0.40*  CALCIUM 8.0* 7.8* 7.8* 8.1* 8.0*  MG  --   --  2.0 2.1 2.1   Liver Function Tests: Recent Labs  Lab 08/23/20 0220  AST 14*  ALT 14  ALKPHOS 76  BILITOT 0.9  PROT 5.1*  ALBUMIN 2.5*   No results for input(s): LIPASE, AMYLASE in the last 168 hours. No results for input(s): AMMONIA in the last 168 hours. CBC: Recent Labs  Lab 08/22/20 1030 08/23/20 0220 08/24/20 0437 08/24/20 0437 08/24/20 1412 08/24/20 1412 08/24/20 2101 08/25/20 0825 08/25/20 1801 08/26/20 0034 08/27/20 0559  WBC 9.7   < > 13.8*   < > 13.4*  --  13.5* 13.3*  --  11.2* 9.1  NEUTROABS 7.6  --  10.1*  --   --   --   --  9.9*  --  8.3* 6.3  HGB 5.6*   < > 9.4*   < > 7.5*   < > 6.2* 10.9* 11.4* 9.9* 10.9*  HCT 16.6*   < > 26.5*   < > 21.3*   < > 18.2* 30.9* 32.2* 28.6* 31.4*  MCV 91.7   < > 91.4   < > 93.4  --  94.3 90.1  --  90.8 91.0  PLT 485*   < > 214   < > 213  --  187 189  --  197 261   < > = values in this interval not displayed.   Cardiac Enzymes: No results for input(s): CKTOTAL, CKMB, CKMBINDEX,  TROPONINI in the last 168 hours. BNP: Invalid input(s): POCBNP CBG: No results for input(s): GLUCAP in the last 168 hours. D-Dimer No results for input(s): DDIMER in the last 72 hours. Hgb A1c No results for input(s): HGBA1C in the last 72 hours. Lipid Profile No results for input(s): CHOL, HDL, LDLCALC, TRIG, CHOLHDL, LDLDIRECT in  the last 72 hours. Thyroid function studies No results for input(s): TSH, T4TOTAL, T3FREE, THYROIDAB in the last 72 hours.  Invalid input(s): FREET3 Anemia work up No results for input(s): VITAMINB12, FOLATE, FERRITIN, TIBC, IRON, RETICCTPCT in the last 72 hours. Urinalysis    Component Value Date/Time   COLORURINE YELLOW 08/27/2020 0030   APPEARANCEUR CLEAR 08/27/2020 0030   LABSPEC 1.006 08/27/2020 0030   PHURINE 7.0 08/27/2020 0030   GLUCOSEU 50 (A) 08/27/2020 0030   HGBUR NEGATIVE 08/27/2020 0030   BILIRUBINUR NEGATIVE 08/27/2020 0030   BILIRUBINUR neg 04/01/2015 0910   KETONESUR NEGATIVE 08/27/2020 0030   PROTEINUR NEGATIVE 08/27/2020 0030   UROBILINOGEN negative 04/01/2015 0910   NITRITE NEGATIVE 08/27/2020 0030   LEUKOCYTESUR NEGATIVE 08/27/2020 0030   Sepsis Labs Invalid input(s): PROCALCITONIN,  WBC,  LACTICIDVEN Microbiology Recent Results (from the past 240 hour(s))  Respiratory Panel by RT PCR (Flu A&B, Covid) - Nasopharyngeal Swab     Status: None   Collection Time: 08/22/20 12:23 PM   Specimen: Nasopharyngeal Swab  Result Value Ref Range Status   SARS Coronavirus 2 by RT PCR NEGATIVE NEGATIVE Final    Comment: (NOTE) SARS-CoV-2 target nucleic acids are NOT DETECTED.  The SARS-CoV-2 RNA is generally detectable in upper respiratoy specimens during the acute phase of infection. The lowest concentration of SARS-CoV-2 viral copies this assay can detect is 131 copies/mL. A negative result does not preclude SARS-Cov-2 infection and should not be used as the sole basis for treatment or other patient management decisions. A negative  result may occur with  improper specimen collection/handling, submission of specimen other than nasopharyngeal swab, presence of viral mutation(s) within the areas targeted by this assay, and inadequate number of viral copies (<131 copies/mL). A negative result must be combined with clinical observations, patient history, and epidemiological information. The expected result is Negative.  Fact Sheet for Patients:  https://www.moore.com/  Fact Sheet for Healthcare Providers:  https://www.young.biz/  This test is no t yet approved or cleared by the [place] FDA and  has been authorized for detection and/or diagnosis of SARS-CoV-2 by FDA under an Emergency Use Authorization (EUA). This EUA will remain  in effect (meaning this test can be used) for the duration of the COVID-19 declaration under Section 564(b)(1) of the Act, 21 U.S.C. section 360bbb-3(b)(1), unless the authorization is terminated or revoked sooner.     Influenza A by PCR NEGATIVE NEGATIVE Final   Influenza B by PCR NEGATIVE NEGATIVE Final    Comment: (NOTE) The Xpert Xpress SARS-CoV-2/FLU/RSV assay is intended as an aid in  the diagnosis of influenza from Nasopharyngeal swab specimens and  should not be used as a sole basis for treatment. Nasal washings and  aspirates are unacceptable for Xpert Xpress SARS-CoV-2/FLU/RSV  testing.  Fact Sheet for Patients: https://www.moore.com/  Fact Sheet for Healthcare Providers: https://www.young.biz/  This test is not yet approved or cleared by the [place] FDA and  has been authorized for detection and/or diagnosis of SARS-CoV-2 by  FDA under an Emergency Use Authorization (EUA). This EUA will remain  in effect (meaning this test can be used) for the duration of the  Covid-19 declaration under Section 564(b)(1) of the Act, 21  U.S.C. section 360bbb-3(b)(1), unless the authorization is   terminated or revoked. Performed at [facility], [address] [place], [place] [address]   Urine culture     Status: Abnormal   Collection Time: 08/23/20 11:08 AM   Specimen: Urine, Random  Result Value  Ref Range Status   Specimen Description   Final    URINE, RANDOM Performed at [facility], [address], [place], [place] [address]    Special Requests   Final    NONE Performed at [facility], [address], [place], [place] [address]    Culture (A)  Final    <10,000 COLONIES/mL INSIGNIFICANT GROWTH Performed at [facility], [address], [place], [place] [address]    Report Status 08/24/2020 FINAL  Final    Procedures/Studies: DG Chest 2 View  Result Date: 08/22/2020 CLINICAL DATA:  Syncope. EXAM: CHEST - 2 VIEW COMPARISON:  None. FINDINGS: Cardiomediastinal silhouette is normal. Mediastinal contours appear intact. There is no evidence of focal airspace consolidation, pleural effusion or pneumothorax. Osseous structures are without acute abnormality. Soft tissues are grossly normal. IMPRESSION: No active cardiopulmonary disease. Electronically Signed   By: Ted Mcalpine M.D.   On: 08/22/2020 11:52   CT Head Wo Contrast  Result Date: 08/22/2020 CLINICAL DATA:  Recent syncopal episode EXAM: CT HEAD WITHOUT CONTRAST TECHNIQUE: Contiguous axial images were obtained from the base of the skull through the vertex without intravenous contrast. COMPARISON:  None. FINDINGS: Brain: Mild atrophic changes and chronic white matter ischemic changes are noted. No findings to suggest acute hemorrhage, acute infarction or space-occupying mass lesion are noted. Vascular: No hyperdense vessel or unexpected calcification. Skull: Normal. Negative for fracture or focal lesion. Sinuses/Orbits: No acute finding. Other: None. IMPRESSION: Chronic atrophic and ischemic changes without acute abnormality. Electronically Signed   By:  Alcide Clever M.D.   On: 08/22/2020 12:05   IR Angiogram Visceral Selective  Result Date: 08/24/2020 INDICATION: 74 year old female with significant upper GI bleeding from a large duodenal ulcer with visible vessel in the ulcer bed. She continued to have bleeding overnight with decreased hemoglobin this morning. She presents for visceral selective ultrasound and embolization. EXAM: SELECTIVE VISCERAL ARTERIOGRAPHY; IR ULTRASOUND GUIDANCE VASC ACCESS RIGHT; IR EMBO ART VEN HEMORR LYMPH EXTRAV INC GUIDE ROADMAPPING; ADDITIONAL ARTERIOGRAPHY MEDICATIONS: None ANESTHESIA/SEDATION: Moderate (conscious) sedation was employed during this procedure. A total of Versed 2 mg and Fentanyl 45 mcg was administered intravenously. Moderate Sedation Time: 40 minutes. The patient's level of consciousness and vital signs were monitored continuously by radiology nursing throughout the procedure under my direct supervision. CONTRAST:  45mL OMNIPAQUE IOHEXOL 300 MG/ML  SOLN FLUOROSCOPY TIME:  Fluoroscopy Time: 10 minutes 54 seconds (240 mGy). COMPLICATIONS: None immediate. PROCEDURE: Informed consent was obtained from the patient following explanation of the procedure, risks, benefits and alternatives. The patient understands, agrees and consents for the procedure. All questions were addressed. A time out was performed prior to the initiation of the procedure. Maximal barrier sterile technique utilized including caps, mask, sterile gowns, sterile gloves, large sterile drape, hand hygiene, and Betadine prep. The right common femoral artery was interrogated with ultrasound and found to be widely patent. An image was obtained and stored for the medical record. Local anesthesia was attained by infiltration with 1% lidocaine. A small dermatotomy was made. Under real-time sonographic guidance, the vessel was punctured with a 21 gauge micropuncture needle. Using standard technique, the initial micro needle was exchanged over a 0.018 micro  wire for a transitional 4 [place] micro sheath. The micro sheath was then exchanged over a 0.035 wire for a 5 French vascular sheath. A C2 cobra catheter was advanced over a Bentson wire into the abdominal aorta. The catheter was used to select the superior mesenteric artery. A  superior mesenteric arteriogram was performed. No evidence of replaced right hepatic or enlarged pancreaticoduodenal arteries. Next, the C2 catheter was used to select the celiac axis. A celiac arteriogram was performed. Conventional hepatic arterial anatomy. There is a focal region of narrowing in the mid aspect of the gastroduodenal artery concerning for a site of vascular injury. Due to the down sloping configuration of the celiac axis, the C2 cobra catheter was exchanged for a Sos Omni selective catheter which was successfully engaged into the celiac artery. A renegade STC microcatheter was then advanced over a Fathom 16 wire into the gastroduodenal artery. Arteriography was performed. This confirms a focal narrowing in the mid gastroduodenal artery. Coil embolization was then performed using a combination of interlock and Ruby detachable microcoils. The mid and distal gastroduodenal artery were successfully coiled to stasis. Contrast injection into the gastroduodenal stump demonstrates reflux back into the hepatic artery. There is no forward flow. The catheter system was removed. Hemostasis was attained using a Cordis extra arterial vascular plug. IMPRESSION: 1. Focal region of narrowing in the mid aspect of the gastroduodenal artery concerning for region of spasm secondary to irritation from the overlying ulcer bed. This may represent the source of intermittent bleeding. 2. Successful coil embolization of the gastroduodenal artery. Signed, Sterling Big, MD, RPVI [facility] Electronically Signed   By: Malachy Moan M.D.   On: 08/24/2020 15:45   IR Angiogram Visceral  Selective  Result Date: 08/24/2020 INDICATION: 74 year old female with significant upper GI bleeding from a large duodenal ulcer with visible vessel in the ulcer bed. She continued to have bleeding overnight with decreased hemoglobin this morning. She presents for visceral selective ultrasound and embolization. EXAM: SELECTIVE VISCERAL ARTERIOGRAPHY; IR ULTRASOUND GUIDANCE VASC ACCESS RIGHT; IR EMBO ART VEN HEMORR LYMPH EXTRAV INC GUIDE ROADMAPPING; ADDITIONAL ARTERIOGRAPHY MEDICATIONS: None ANESTHESIA/SEDATION: Moderate (conscious) sedation was employed during this procedure. A total of Versed 2 mg and Fentanyl 45 mcg was administered intravenously. Moderate Sedation Time: 40 minutes. The patient's level of consciousness and vital signs were monitored continuously by radiology nursing throughout the procedure under my direct supervision. CONTRAST:  45mL OMNIPAQUE IOHEXOL 300 MG/ML  SOLN FLUOROSCOPY TIME:  Fluoroscopy Time: 10 minutes 54 seconds (240 mGy). COMPLICATIONS: None immediate. PROCEDURE: Informed consent was obtained from the patient following explanation of the procedure, risks, benefits and alternatives. The patient understands, agrees and consents for the procedure. All questions were addressed. A time out was performed prior to the initiation of the procedure. Maximal barrier sterile technique utilized including caps, mask, sterile gowns, sterile gloves, large sterile drape, hand hygiene, and Betadine prep. The right common femoral artery was interrogated with ultrasound and found to be widely patent. An image was obtained and stored for the medical record. Local anesthesia was attained by infiltration with 1% lidocaine. A small dermatotomy was made. Under real-time sonographic guidance, the vessel was punctured with a 21 gauge micropuncture needle. Using standard technique, the initial micro needle was exchanged over a 0.018 micro wire for a transitional 4 [place] micro sheath. The micro sheath was  then exchanged over a 0.035 wire for a 5 French vascular sheath. A C2 cobra catheter was advanced over a Bentson wire into the abdominal aorta. The catheter was used to select the superior mesenteric artery. A superior mesenteric arteriogram was performed. No evidence of replaced right hepatic or enlarged pancreaticoduodenal arteries. Next, the C2 catheter was used to select the celiac axis. A celiac arteriogram was performed. Conventional hepatic  arterial anatomy. There is a focal region of narrowing in the mid aspect of the gastroduodenal artery concerning for a site of vascular injury. Due to the down sloping configuration of the celiac axis, the C2 cobra catheter was exchanged for a Sos Omni selective catheter which was successfully engaged into the celiac artery. A renegade STC microcatheter was then advanced over a Fathom 16 wire into the gastroduodenal artery. Arteriography was performed. This confirms a focal narrowing in the mid gastroduodenal artery. Coil embolization was then performed using a combination of interlock and Ruby detachable microcoils. The mid and distal gastroduodenal artery were successfully coiled to stasis. Contrast injection into the gastroduodenal stump demonstrates reflux back into the hepatic artery. There is no forward flow. The catheter system was removed. Hemostasis was attained using a Cordis extra arterial vascular plug. IMPRESSION: 1. Focal region of narrowing in the mid aspect of the gastroduodenal artery concerning for region of spasm secondary to irritation from the overlying ulcer bed. This may represent the source of intermittent bleeding. 2. Successful coil embolization of the gastroduodenal artery. Signed, Sterling Big, MD, RPVI [facility] Electronically Signed   By: Malachy Moan M.D.   On: 08/24/2020 15:45   IR Angiogram Selective Each Additional Vessel  Result Date: 08/24/2020 INDICATION:  74 year old female with significant upper GI bleeding from a large duodenal ulcer with visible vessel in the ulcer bed. She continued to have bleeding overnight with decreased hemoglobin this morning. She presents for visceral selective ultrasound and embolization. EXAM: SELECTIVE VISCERAL ARTERIOGRAPHY; IR ULTRASOUND GUIDANCE VASC ACCESS RIGHT; IR EMBO ART VEN HEMORR LYMPH EXTRAV INC GUIDE ROADMAPPING; ADDITIONAL ARTERIOGRAPHY MEDICATIONS: None ANESTHESIA/SEDATION: Moderate (conscious) sedation was employed during this procedure. A total of Versed 2 mg and Fentanyl 45 mcg was administered intravenously. Moderate Sedation Time: 40 minutes. The patient's level of consciousness and vital signs were monitored continuously by radiology nursing throughout the procedure under my direct supervision. CONTRAST:  45mL OMNIPAQUE IOHEXOL 300 MG/ML  SOLN FLUOROSCOPY TIME:  Fluoroscopy Time: 10 minutes 54 seconds (240 mGy). COMPLICATIONS: None immediate. PROCEDURE: Informed consent was obtained from the patient following explanation of the procedure, risks, benefits and alternatives. The patient understands, agrees and consents for the procedure. All questions were addressed. A time out was performed prior to the initiation of the procedure. Maximal barrier sterile technique utilized including caps, mask, sterile gowns, sterile gloves, large sterile drape, hand hygiene, and Betadine prep. The right common femoral artery was interrogated with ultrasound and found to be widely patent. An image was obtained and stored for the medical record. Local anesthesia was attained by infiltration with 1% lidocaine. A small dermatotomy was made. Under real-time sonographic guidance, the vessel was punctured with a 21 gauge micropuncture needle. Using standard technique, the initial micro needle was exchanged over a 0.018 micro wire for a transitional 4 [place] micro sheath. The micro sheath was then exchanged over a 0.035 wire for a 5 French  vascular sheath. A C2 cobra catheter was advanced over a Bentson wire into the abdominal aorta. The catheter was used to select the superior mesenteric artery. A superior mesenteric arteriogram was performed. No evidence of replaced right hepatic or enlarged pancreaticoduodenal arteries. Next, the C2 catheter was used to select the celiac axis. A celiac arteriogram was performed. Conventional hepatic arterial anatomy. There is a focal region of narrowing in the mid aspect of the gastroduodenal artery concerning for a site of vascular injury. Due to the down sloping configuration of  the celiac axis, the C2 cobra catheter was exchanged for a Sos Omni selective catheter which was successfully engaged into the celiac artery. A renegade STC microcatheter was then advanced over a Fathom 16 wire into the gastroduodenal artery. Arteriography was performed. This confirms a focal narrowing in the mid gastroduodenal artery. Coil embolization was then performed using a combination of interlock and Ruby detachable microcoils. The mid and distal gastroduodenal artery were successfully coiled to stasis. Contrast injection into the gastroduodenal stump demonstrates reflux back into the hepatic artery. There is no forward flow. The catheter system was removed. Hemostasis was attained using a Cordis extra arterial vascular plug. IMPRESSION: 1. Focal region of narrowing in the mid aspect of the gastroduodenal artery concerning for region of spasm secondary to irritation from the overlying ulcer bed. This may represent the source of intermittent bleeding. 2. Successful coil embolization of the gastroduodenal artery. Signed, Sterling BigHeath K. McCullough, MD, RPVI [facility] Electronically Signed   By: Malachy MoanHeath  McCullough M.D.   On: 08/24/2020 15:45   IR US Guide Vasc Access Right  Result Date: 08/24/2020 INDICATION: 74 year old female with significant upper GI bleeding from a large  duodenal ulcer with visible vessel in the ulcer bed. She continued to have bleeding overnight with decreased hemoglobin this morning. She presents for visceral selective ultrasound and embolization. EXAM: SELECTIVE VISCERAL ARTERIOGRAPHY; IR ULTRASOUND GUIDANCE VASC ACCESS RIGHT; IR EMBO ART VEN HEMORR LYMPH EXTRAV INC GUIDE ROADMAPPING; ADDITIONAL ARTERIOGRAPHY MEDICATIONS: None ANESTHESIA/SEDATION: Moderate (conscious) sedation was employed during this procedure. A total of Versed 2 mg and Fentanyl 45 mcg was administered intravenously. Moderate Sedation Time: 40 minutes. The patient's level of consciousness and vital signs were monitored continuously by radiology nursing throughout the procedure under my direct supervision. CONTRAST:  45mL OMNIPAQUE IOHEXOL 300 MG/ML  SOLN FLUOROSCOPY TIME:  Fluoroscopy Time: 10 minutes 54 seconds (240 mGy). COMPLICATIONS: None immediate. PROCEDURE: Informed consent was obtained from the patient following explanation of the procedure, risks, benefits and alternatives. The patient understands, agrees and consents for the procedure. All questions were addressed. A time out was performed prior to the initiation of the procedure. Maximal barrier sterile technique utilized including caps, mask, sterile gowns, sterile gloves, large sterile drape, hand hygiene, and Betadine prep. The right common femoral artery was interrogated with ultrasound and found to be widely patent. An image was obtained and stored for the medical record. Local anesthesia was attained by infiltration with 1% lidocaine. A small dermatotomy was made. Under real-time sonographic guidance, the vessel was punctured with a 21 gauge micropuncture needle. Using standard technique, the initial micro needle was exchanged over a 0.018 micro wire for a transitional 4 [place] micro sheath. The micro sheath was then exchanged over a 0.035 wire for a 5 French vascular sheath. A C2 cobra catheter was advanced over a Bentson wire  into the abdominal aorta. The catheter was used to select the superior mesenteric artery. A superior mesenteric arteriogram was performed. No evidence of replaced right hepatic or enlarged pancreaticoduodenal arteries. Next, the C2 catheter was used to select the celiac axis. A celiac arteriogram was performed. Conventional hepatic arterial anatomy. There is a focal region of narrowing in the mid aspect of the gastroduodenal artery concerning for a site of vascular injury. Due to the down sloping configuration of the celiac axis, the C2 cobra catheter was exchanged for a Sos Omni selective catheter which was successfully engaged into the celiac artery. A renegade STC microcatheter was then advanced over  a Fathom 16 wire into the gastroduodenal artery. Arteriography was performed. This confirms a focal narrowing in the mid gastroduodenal artery. Coil embolization was then performed using a combination of interlock and Ruby detachable microcoils. The mid and distal gastroduodenal artery were successfully coiled to stasis. Contrast injection into the gastroduodenal stump demonstrates reflux back into the hepatic artery. There is no forward flow. The catheter system was removed. Hemostasis was attained using a Cordis extra arterial vascular plug. IMPRESSION: 1. Focal region of narrowing in the mid aspect of the gastroduodenal artery concerning for region of spasm secondary to irritation from the overlying ulcer bed. This may represent the source of intermittent bleeding. 2. Successful coil embolization of the gastroduodenal artery. Signed, Sterling Big, MD, RPVI [facility] Electronically Signed   By: Malachy Moan M.D.   On: 08/24/2020 15:45   ECHOCARDIOGRAM COMPLETE  Result Date: 08/25/2020    ECHOCARDIOGRAM REPORT   Patient Name:   LUNETTE TAPP Date of Exam: 08/25/2020 Medical Rec #:  [id number]     Height:       62.0 in Accession #:     [id number]    Weight:       95.0 lb Date of Birth:  October 03, 1946    BSA:          1.393 m Patient Age:    73 years      BP:           116/76 mmHg Patient Gender: F             HR:           74 bpm. Exam Location:  Inpatient Procedure: 2D Echo Indications:    780.2 syncope  History:        Patient has no prior history of Echocardiogram examinations.  Sonographer:    Celene Skeen RDCS (AE) Referring Phys: [id number] ANAND D HONGALGI IMPRESSIONS  1. Left ventricular ejection fraction, by estimation, is 60 to 65%. The left ventricle has normal function. The left ventricle has no regional wall motion abnormalities. Left ventricular diastolic parameters are consistent with Grade II diastolic dysfunction (pseudonormalization).  2. Right ventricular systolic function is normal. The right ventricular size is normal.  3. The mitral valve is normal in structure. Mild mitral valve regurgitation. No evidence of mitral stenosis.  4. The aortic valve is normal in structure. Aortic valve regurgitation is not visualized. No aortic stenosis is present.  5. The inferior vena cava is normal in size with greater than 50% respiratory variability, suggesting right atrial pressure of 3 mmHg. FINDINGS  Left Ventricle: Left ventricular ejection fraction, by estimation, is 60 to 65%. The left ventricle has normal function. The left ventricle has no regional wall motion abnormalities. The left ventricular internal cavity size was normal in size. There is  no left ventricular hypertrophy. Left ventricular diastolic parameters are consistent with Grade II diastolic dysfunction (pseudonormalization). Right Ventricle: The right ventricular size is normal. No increase in right ventricular wall thickness. Right ventricular systolic function is normal. Left Atrium: Left atrial size was normal in size. Right Atrium: Right atrial size was normal in size. Pericardium: There is no evidence of pericardial effusion. Mitral Valve: The mitral valve is normal in  structure. Mild mitral valve regurgitation. No evidence of mitral valve stenosis. Tricuspid Valve: The tricuspid valve is normal in structure. Tricuspid valve regurgitation is not demonstrated. No evidence of tricuspid stenosis. Aortic Valve: The aortic valve is normal in structure. Aortic  valve regurgitation is not visualized. No aortic stenosis is present. Pulmonic Valve: The pulmonic valve was normal in structure. Pulmonic valve regurgitation is not visualized. No evidence of pulmonic stenosis. Aorta: The aortic root is normal in size and structure. Venous: The inferior vena cava is normal in size with greater than 50% respiratory variability, suggesting right atrial pressure of 3 mmHg. IAS/Shunts: No atrial level shunt detected by color flow Doppler.  LEFT VENTRICLE PLAX 2D LVIDd:         4.20 cm  Diastology LVIDs:         2.20 cm  LV e' medial:    7.18 cm/s LV PW:         0.80 cm  LV E/e' medial:  11.3 LV IVS:        0.60 cm  LV e' lateral:   10.80 cm/s LVOT diam:     1.80 cm  LV E/e' lateral: 7.5 LV SV:         46 LV SV Index:   33 LVOT Area:     2.54 cm  RIGHT VENTRICLE RV S prime:     16.30 cm/s TAPSE (M-mode): 2.3 cm LEFT ATRIUM             Index       RIGHT ATRIUM           Index LA diam:        2.60 cm 1.87 cm/m  RA Area:     10.80 cm LA Vol (A2C):   26.5 ml 19.02 ml/m RA Volume:   20.60 ml  14.78 ml/m LA Vol (A4C):   22.4 ml 16.08 ml/m LA Biplane Vol: 25.2 ml 18.09 ml/m  AORTIC VALVE LVOT Vmax:   88.80 cm/s LVOT Vmean:  65.100 cm/s LVOT VTI:    0.182 m  AORTA Ao Root diam: 2.60 cm MITRAL VALVE MV Area (PHT): 3.85 cm    SHUNTS MV Decel Time: 197 msec    Systemic VTI:  0.18 m MV E velocity: 81.40 cm/s  Systemic Diam: 1.80 cm MV A velocity: 66.00 cm/s MV E/A ratio:  1.23 Donato Schultz MD Electronically signed by Donato Schultz MD Signature Date/Time: 08/25/2020/2:41:28 PM    Final    IR EMBO ART  VEN HEMORR LYMPH EXTRAV  INC GUIDE ROADMAPPING  Result Date: 08/24/2020 INDICATION: 74 year old female  with significant upper GI bleeding from a large duodenal ulcer with visible vessel in the ulcer bed. She continued to have bleeding overnight with decreased hemoglobin this morning. She presents for visceral selective ultrasound and embolization. EXAM: SELECTIVE VISCERAL ARTERIOGRAPHY; IR ULTRASOUND GUIDANCE VASC ACCESS RIGHT; IR EMBO ART VEN HEMORR LYMPH EXTRAV INC GUIDE ROADMAPPING; ADDITIONAL ARTERIOGRAPHY MEDICATIONS: None ANESTHESIA/SEDATION: Moderate (conscious) sedation was employed during this procedure. A total of Versed 2 mg and Fentanyl 45 mcg was administered intravenously. Moderate Sedation Time: 40 minutes. The patient's level of consciousness and vital signs were monitored continuously by radiology nursing throughout the procedure under my direct supervision. CONTRAST:  45mL OMNIPAQUE IOHEXOL 300 MG/ML  SOLN FLUOROSCOPY TIME:  Fluoroscopy Time: 10 minutes 54 seconds (240 mGy). COMPLICATIONS: None immediate. PROCEDURE: Informed consent was obtained from the patient following explanation of the procedure, risks, benefits and alternatives. The patient understands, agrees and consents for the procedure. All questions were addressed. A time out was performed prior to the initiation of the procedure. Maximal barrier sterile technique utilized including caps, mask, sterile gowns, sterile gloves, large sterile drape, hand hygiene, and Betadine prep. The right common femoral  artery was interrogated with ultrasound and found to be widely patent. An image was obtained and stored for the medical record. Local anesthesia was attained by infiltration with 1% lidocaine. A small dermatotomy was made. Under real-time sonographic guidance, the vessel was punctured with a 21 gauge micropuncture needle. Using standard technique, the initial micro needle was exchanged over a 0.018 micro wire for a transitional 4 [place] micro sheath. The micro sheath was then exchanged over a 0.035 wire for a 5 French vascular sheath. A C2  cobra catheter was advanced over a Bentson wire into the abdominal aorta. The catheter was used to select the superior mesenteric artery. A superior mesenteric arteriogram was performed. No evidence of replaced right hepatic or enlarged pancreaticoduodenal arteries. Next, the C2 catheter was used to select the celiac axis. A celiac arteriogram was performed. Conventional hepatic arterial anatomy. There is a focal region of narrowing in the mid aspect of the gastroduodenal artery concerning for a site of vascular injury. Due to the down sloping configuration of the celiac axis, the C2 cobra catheter was exchanged for a Sos Omni selective catheter which was successfully engaged into the celiac artery. A renegade STC microcatheter was then advanced over a Fathom 16 wire into the gastroduodenal artery. Arteriography was performed. This confirms a focal narrowing in the mid gastroduodenal artery. Coil embolization was then performed using a combination of interlock and Ruby detachable microcoils. The mid and distal gastroduodenal artery were successfully coiled to stasis. Contrast injection into the gastroduodenal stump demonstrates reflux back into the hepatic artery. There is no forward flow. The catheter system was removed. Hemostasis was attained using a Cordis extra arterial vascular plug. IMPRESSION: 1. Focal region of narrowing in the mid aspect of the gastroduodenal artery concerning for region of spasm secondary to irritation from the overlying ulcer bed. This may represent the source of intermittent bleeding. 2. Successful coil embolization of the gastroduodenal artery. Signed, Sterling Big, MD, RPVI [facility] Electronically Signed   By: Malachy Moan M.D.   On: 08/24/2020 15:45     Time coordinating discharge: Over 30 minutes    Lewie Chamber, MD  [facility] 08/27/2020, 2:12 PM Pager: Secure chat  If 7PM-7AM, please  contact night-coverage [email or web address] Password [email or web address]

## 2020-08-27 NOTE — Progress Notes (Signed)
Pt requesting stronger pain medication than Tylenol for chronic back pain. Paged floor coverage.

## 2020-08-27 NOTE — Progress Notes (Signed)
Subjective: Patient is tolerating regular diet. She denies having further episodes of melena or blood in stool.  She has not had a bowel movement today morning.  Objective: Vital signs in last 24 hours: Temp:  [98.1 F (36.7 C)-99.5 F (37.5 C)] 98.1 F (36.7 C) (10/01 0525) Pulse Rate:  [71-78] 71 (10/01 0525) Resp:  [12-20] 20 (10/01 0525) BP: (125-145)/(71-74) 135/74 (10/01 0525) SpO2:  [98 %-99 %] 98 % (10/01 0525) Weight change:  Last BM Date: 08/26/20  PE: Thinly built, pleasant GENERAL: Mild pallor  ABDOMEN: Nondistended, nontender EXTREMITIES: No deformity  Lab Results: Results for orders placed or performed during the hospital encounter of 08/22/20 (from the past 48 hour(s))  Hemoglobin and hematocrit, blood     Status: Abnormal   Collection Time: 08/25/20  6:01 PM  Result Value Ref Range   Hemoglobin 11.4 (L) 12.0 - 15.0 g/dL   HCT 41.2 (L) 36 - 46 %    Comment: Performed at [facility], [address], [place], [place] [address]  Basic metabolic panel     Status: Abnormal   Collection Time: 08/26/20 12:34 AM  Result Value Ref Range   Sodium 129 (L) 135 - 145 mmol/L   Potassium 3.5 3.5 - 5.1 mmol/L   Chloride 96 (L) 98 - 111 mmol/L   CO2 24 22 - 32 mmol/L   Glucose, Bld 106 (H) 70 - 99 mg/dL    Comment: Glucose reference range applies only to samples taken after fasting for at least 8 hours.   BUN 15 8 - 23 mg/dL   Creatinine, Ser 6.72 (L) 0.44 - 1.00 mg/dL   Calcium 8.1 (L) 8.9 - 10.3 mg/dL   GFR calc non Af Amer >60 >60 mL/min   GFR calc Af Amer >60 >60 mL/min   Anion gap 9 5 - 15    Comment: Performed at [facility], [address] [place], [place] [address]  CBC with Differential/Platelet     Status: Abnormal   Collection Time: 08/26/20 12:34 AM  Result Value Ref Range   WBC 11.2 (H) 4.0 - 10.5 K/uL   RBC 3.15 (L) 3.87 - 5.11 MIL/uL   Hemoglobin 9.9 (L) 12.0 - 15.0 g/dL   HCT 96.2 (L) 36 - 46 %   MCV 90.8 80.0  - 100.0 fL   MCH 31.4 26.0 - 34.0 pg   MCHC 34.6 30.0 - 36.0 g/dL   RDW 83.6 (H) 62.9 - 47.6 %   Platelets 197 150 - 400 K/uL   nRBC 0.0 0.0 - 0.2 %   Neutrophils Relative % 74 %   Neutro Abs 8.3 (H) 1.7 - 7.7 K/uL   Lymphocytes Relative 13 %   Lymphs Abs 1.4 0.7 - 4.0 K/uL   Monocytes Relative 9 %   Monocytes Absolute 1.0 0 - 1 K/uL   Eosinophils Relative 3 %   Eosinophils Absolute 0.3 0 - 0 K/uL   Basophils Relative 0 %   Basophils Absolute 0.0 0 - 0 K/uL   Immature Granulocytes 1 %   Abs Immature Granulocytes 0.13 (H) 0.00 - 0.07 K/uL    Comment: Performed at [facility], [address], [place], [place] [address]  Magnesium     Status: None   Collection Time: 08/26/20 12:34 AM  Result Value Ref Range   Magnesium 2.1 1.7 - 2.4 mg/dL    Comment: Performed at [facility], [address], [place], [place] [address]  Urinalysis,  Routine w reflex microscopic Urine, Clean Catch     Status: Abnormal   Collection Time: 08/27/20 12:30 AM  Result Value Ref Range   Color, Urine YELLOW YELLOW   APPearance CLEAR CLEAR   Specific Gravity, Urine 1.006 1.005 - 1.030   pH 7.0 5.0 - 8.0   Glucose, UA 50 (A) NEGATIVE mg/dL   Hgb urine dipstick NEGATIVE NEGATIVE   Bilirubin Urine NEGATIVE NEGATIVE   Ketones, ur NEGATIVE NEGATIVE mg/dL   Protein, ur NEGATIVE NEGATIVE mg/dL   Nitrite NEGATIVE NEGATIVE   Leukocytes,Ua NEGATIVE NEGATIVE    Comment: Performed at [facility], [address] [place], [place] [address]  Basic metabolic panel     Status: Abnormal   Collection Time: 08/27/20  5:59 AM  Result Value Ref Range   Sodium 132 (L) 135 - 145 mmol/L   Potassium 3.1 (L) 3.5 - 5.1 mmol/L   Chloride 97 (L) 98 - 111 mmol/L   CO2 26 22 - 32 mmol/L   Glucose, Bld 126 (H) 70 - 99 mg/dL    Comment: Glucose reference range applies only to samples taken after fasting for at least 8 hours.   BUN 8 8 - 23 mg/dL   Creatinine, Ser 0.17 (L)  0.44 - 1.00 mg/dL   Calcium 8.0 (L) 8.9 - 10.3 mg/dL   GFR calc non Af Amer >60 >60 mL/min   GFR calc Af Amer >60 >60 mL/min   Anion gap 9 5 - 15    Comment: Performed at [facility], [address], [place], [place] [address]  CBC with Differential/Platelet     Status: Abnormal   Collection Time: 08/27/20  5:59 AM  Result Value Ref Range   WBC 9.1 4.0 - 10.5 K/uL   RBC 3.45 (L) 3.87 - 5.11 MIL/uL   Hemoglobin 10.9 (L) 12.0 - 15.0 g/dL   HCT 67.5 (L) 36 - 46 %   MCV 91.0 80.0 - 100.0 fL   MCH 31.6 26.0 - 34.0 pg   MCHC 34.7 30.0 - 36.0 g/dL   RDW 91.6 (H) 38.4 - 66.5 %   Platelets 261 150 - 400 K/uL   nRBC 0.0 0.0 - 0.2 %   Neutrophils Relative % 68 %   Neutro Abs 6.3 1.7 - 7.7 K/uL   Lymphocytes Relative 18 %   Lymphs Abs 1.7 0.7 - 4.0 K/uL   Monocytes Relative 10 %   Monocytes Absolute 0.9 0 - 1 K/uL   Eosinophils Relative 3 %   Eosinophils Absolute 0.2 0 - 0 K/uL   Basophils Relative 0 %   Basophils Absolute 0.0 0 - 0 K/uL   Immature Granulocytes 1 %   Abs Immature Granulocytes 0.06 0.00 - 0.07 K/uL    Comment: Performed at [facility], [address] [place], [place] [address]  Magnesium     Status: None   Collection Time: 08/27/20  5:59 AM  Result Value Ref Range   Magnesium 2.1 1.7 - 2.4 mg/dL    Comment: Performed at [facility], [address], [place], [place] [address]    Studies/Results: ECHOCARDIOGRAM COMPLETE  Result Date: 08/25/2020    ECHOCARDIOGRAM REPORT   Patient Name:   Elizabeth Haynes Date of Exam: 08/25/2020 Medical Rec #:  [id number]     Height:       62.0 in Accession #:    [id number]    Weight:       95.0 lb Date  of Birth:  [birth year]    BSA:          1.393 m Patient Age:    74 years      BP:           116/76 mmHg Patient Gender: F             HR:           74 bpm. Exam Location:  Inpatient Procedure: 2D Echo Indications:    780.2 syncope  History:        Patient has no prior history of  Echocardiogram examinations.  Sonographer:    Celene SkeenVijay Shankar RDCS (AE) Referring Phys: [id number] ANAND D HONGALGI IMPRESSIONS  1. Left ventricular ejection fraction, by estimation, is 60 to 65%. The left ventricle has normal function. The left ventricle has no regional wall motion abnormalities. Left ventricular diastolic parameters are consistent with Grade II diastolic dysfunction (pseudonormalization).  2. Right ventricular systolic function is normal. The right ventricular size is normal.  3. The mitral valve is normal in structure. Mild mitral valve regurgitation. No evidence of mitral stenosis.  4. The aortic valve is normal in structure. Aortic valve regurgitation is not visualized. No aortic stenosis is present.  5. The inferior vena cava is normal in size with greater than 50% respiratory variability, suggesting right atrial pressure of 3 mmHg. FINDINGS  Left Ventricle: Left ventricular ejection fraction, by estimation, is 60 to 65%. The left ventricle has normal function. The left ventricle has no regional wall motion abnormalities. The left ventricular internal cavity size was normal in size. There is  no left ventricular hypertrophy. Left ventricular diastolic parameters are consistent with Grade II diastolic dysfunction (pseudonormalization). Right Ventricle: The right ventricular size is normal. No increase in right ventricular wall thickness. Right ventricular systolic function is normal. Left Atrium: Left atrial size was normal in size. Right Atrium: Right atrial size was normal in size. Pericardium: There is no evidence of pericardial effusion. Mitral Valve: The mitral valve is normal in structure. Mild mitral valve regurgitation. No evidence of mitral valve stenosis. Tricuspid Valve: The tricuspid valve is normal in structure. Tricuspid valve regurgitation is not demonstrated. No evidence of tricuspid stenosis. Aortic Valve: The aortic valve is normal in structure. Aortic valve regurgitation is not  visualized. No aortic stenosis is present. Pulmonic Valve: The pulmonic valve was normal in structure. Pulmonic valve regurgitation is not visualized. No evidence of pulmonic stenosis. Aorta: The aortic root is normal in size and structure. Venous: The inferior vena cava is normal in size with greater than 50% respiratory variability, suggesting right atrial pressure of 3 mmHg. IAS/Shunts: No atrial level shunt detected by color flow Doppler.  LEFT VENTRICLE PLAX 2D LVIDd:         4.20 cm  Diastology LVIDs:         2.20 cm  LV e' medial:    7.18 cm/s LV PW:         0.80 cm  LV E/e' medial:  11.3 LV IVS:        0.60 cm  LV e' lateral:   10.80 cm/s LVOT diam:     1.80 cm  LV E/e' lateral: 7.5 LV SV:         46 LV SV Index:   33 LVOT Area:     2.54 cm  RIGHT VENTRICLE RV S prime:     16.30 cm/s TAPSE (M-mode): 2.3 cm LEFT ATRIUM  Index       RIGHT ATRIUM           Index LA diam:        2.60 cm 1.87 cm/m  RA Area:     10.80 cm LA Vol (A2C):   26.5 ml 19.02 ml/m RA Volume:   20.60 ml  14.78 ml/m LA Vol (A4C):   22.4 ml 16.08 ml/m LA Biplane Vol: 25.2 ml 18.09 ml/m  AORTIC VALVE LVOT Vmax:   88.80 cm/s LVOT Vmean:  65.100 cm/s LVOT VTI:    0.182 m  AORTA Ao Root diam: 2.60 cm MITRAL VALVE MV Area (PHT): 3.85 cm    SHUNTS MV Decel Time: 197 msec    Systemic VTI:  0.18 m MV E velocity: 81.40 cm/s  Systemic Diam: 1.80 cm MV A velocity: 66.00 cm/s MV E/A ratio:  1.23 Donato Schultz MD Electronically signed by Donato Schultz MD Signature Date/Time: 08/25/2020/2:41:28 PM    Final     Medications: I have reviewed the patient's current medications.  Assessment: Large cratered duodenal bulb ulcer with visible vessel treated with IR guided embolization of GDA Hemoglobin stable at 10.9 today morning and BUN normalized at 8 Mild hyponatremia, mild hypokalemia  Plan: Okay to DC patient home today. Advised patient and her sister at bedside that she will need to be on PPI twice daily for at least the next 2  months. Patient to follow-up with me as an outpatient with plans to perform endoscopy in 2 months to document healing of ulcers. Advised patient to avoid aspirin and NSAIDs.  Kerin Salen, MD 08/27/2020, 10:03 AM

## 2020-08-27 NOTE — Progress Notes (Signed)
Physical Therapy Treatment Patient Details Name: Elizabeth Haynes MRN: [id number] DOB: 26-Jul-1946 Today's Date: 08/27/2020    History of Present Illness 74 yo female admitted with GI bleed. Hx of osteopenia, osteoporosis    PT Comments    Pt feeling better.  Assisted OOB to bathroom then amb in hallway.   Follow Up Recommendations  Home health PT     Equipment Recommendations  None recommended by PT    Recommendations for Other Services       Precautions / Restrictions Precautions Precautions: Fall    Mobility  Bed Mobility Overal bed mobility: Modified Independent             General bed mobility comments: self able with increased time  Transfers Overall transfer level: Needs assistance Equipment used: Rolling walker (2 wheeled) Transfers: Sit to/from Stand Sit to Stand: Supervision         General transfer comment: good safety cognition and use of hands to steady self  Ambulation/Gait Ambulation/Gait assistance: Supervision Gait Distance (Feet): 75 Feet Assistive device: Rolling walker (2 wheeled) Gait Pattern/deviations: Step-through pattern;Decreased stride length Gait velocity: decr   General Gait Details: safety. Mildly unsteady. Cues for turning   Stairs             Wheelchair Mobility    Modified Rankin (Stroke Patients Only)       Balance                                            Cognition Arousal/Alertness: Awake/alert Behavior During Therapy: WFL for tasks assessed/performed Overall Cognitive Status: Within Functional Limits for tasks assessed                                 General Comments: AxO X3 very pleasant      Exercises      General Comments        Pertinent Vitals/Pain Pain Assessment: No/denies pain    Home Living                      Prior Function            PT Goals (current goals can now be found in the care plan section) Progress towards PT goals:  Progressing toward goals    Frequency    Min 3X/week      PT Plan Current plan remains appropriate    Co-evaluation              AM-PAC PT "6 Clicks" Mobility   Outcome Measure  Help needed turning from your back to your side while in a flat bed without using bedrails?: None Help needed moving from lying on your back to sitting on the side of a flat bed without using bedrails?: None Help needed moving to and from a bed to a chair (including a wheelchair)?: None Help needed standing up from a chair using your arms (e.g., wheelchair or bedside chair)?: None Help needed to walk in hospital room?: A Little Help needed climbing 3-5 steps with a railing? : A Little 6 Click Score: 22    End of Session Equipment Utilized During Treatment: Gait belt Activity Tolerance: Patient tolerated treatment well Patient left: in bed;with call bell/phone within reach;with family/visitor present Nurse Communication: Mobility status PT Visit Diagnosis: Muscle weakness (  generalized) (M62.81);Difficulty in walking, not elsewhere classified (R26.2);Unsteadiness on feet (R26.81);History of falling (Z91.81)     Time: 1020-1040 PT Time Calculation (min) (ACUTE ONLY): 20 min  Charges:  $Gait Training: 8-22 mins                     Felecia Shelling  PTA Acute  Rehabilitation Services Pager      [phone] Office      [phone]

## 2020-08-27 NOTE — Progress Notes (Signed)
Noted plans for possible discharge today. Patient is on a regular diet and hgb stable. No indication for surgical involvement at this time. Please call if we can be of further assistance but we will sign off.   Juliet Rude , [facility] 08/27/2020, 7:01 AM Please see Amion for pager number during day hours 7:00am-4:30pm

## 2020-08-30 ENCOUNTER — Telehealth: Payer: Self-pay | Admitting: Family Medicine

## 2020-08-30 NOTE — Telephone Encounter (Signed)
De[facility] from [facility] called and wanted to let you know that they are going to start plan care tuesday 08/31/2020 she can be reached at [phone]

## 2020-08-31 ENCOUNTER — Telehealth: Payer: Self-pay

## 2020-08-31 DIAGNOSIS — N390 Urinary tract infection, site not specified: Secondary | ICD-10-CM | POA: Diagnosis not present

## 2020-08-31 DIAGNOSIS — K2950 Unspecified chronic gastritis without bleeding: Secondary | ICD-10-CM | POA: Diagnosis not present

## 2020-08-31 DIAGNOSIS — M810 Age-related osteoporosis without current pathological fracture: Secondary | ICD-10-CM | POA: Diagnosis not present

## 2020-08-31 DIAGNOSIS — K264 Chronic or unspecified duodenal ulcer with hemorrhage: Secondary | ICD-10-CM | POA: Diagnosis not present

## 2020-08-31 DIAGNOSIS — Z79891 Long term (current) use of opiate analgesic: Secondary | ICD-10-CM | POA: Diagnosis not present

## 2020-08-31 DIAGNOSIS — Z9181 History of falling: Secondary | ICD-10-CM | POA: Diagnosis not present

## 2020-08-31 DIAGNOSIS — D62 Acute posthemorrhagic anemia: Secondary | ICD-10-CM | POA: Diagnosis not present

## 2020-08-31 DIAGNOSIS — S3992XD Unspecified injury of lower back, subsequent encounter: Secondary | ICD-10-CM | POA: Diagnosis not present

## 2020-08-31 DIAGNOSIS — W19XXXD Unspecified fall, subsequent encounter: Secondary | ICD-10-CM | POA: Diagnosis not present

## 2020-08-31 NOTE — Telephone Encounter (Signed)
Please advise ok for PT as requested

## 2020-08-31 NOTE — Telephone Encounter (Signed)
Jerilynn from [facility] called for a verbal order for PT for 1 time a week for 1 week, 2 times a week for 6 weeks, and 1 time a week for 2 weeks. She can be reached at [phone].

## 2020-08-31 NOTE — Telephone Encounter (Signed)
Called the PT back with [facility] and LM for the ok for the PT order.

## 2020-09-01 NOTE — Progress Notes (Signed)
Chief Complaint  Patient presents with  . Hospitalization Follow-up    still having urinary burning.(will get UA) Having constipation. Is not sleeping AT all, is she takes a 20 min nap, that's it.    . Anxiety    would deal with her stress/anxiety by walking, now that she cannot walk her anxiety is worsening.   . Weight Loss    concerned about her weight. Is going to get outpatient OT/PT and will need approval. Also needs walker, the one she has today is borrowed. Has not seen ortho, should she? Wants to wean off hydrocodone, only taking at night, what can she take in place of when uncomfortable?   Patient presents for hospital follow-up. She is accompanied by her neighbor Darl Pikes today, who is quite helpful and concerned.  She fell on 8/31, suffered mildly displaced sacral fracture bilaterally, minimally displaced left L5 transverse fracture process.  She was admitted 9/4 for pain control, noted to have hyponatremia (Na 118 on admission).  She was discharged to SNF on 9/8. Shortly after being discharged home, she was readmitted to the hospital with GI bleed.  She was hospitalized 9/26-10/1.  EGD on 9/27 revealed gastric and duodenal ulcers (the latter of which was cause of bleed, but not amenable to endoscopic treatment).  She then underwent coil embolization with IR on 08/24/2020.  She received blood transfusions.  Hg was up to 10.9 on discharge (was 5.6 on admission). Biopsy showed mild chronic gastritis, negative for H.pylori.  Last CBC: Lab Results  Component Value Date   WBC 9.1 08/27/2020   HGB 10.9 (L) 08/27/2020   HCT 31.4 (L) 08/27/2020   MCV 91.0 08/27/2020   PLT 261 08/27/2020   She was discharged on Protonix 40mg  BID. She denies any further melena, hematochezia, or abdominal pain. She has been having some constipation. She reports that her bowel movement today was dark, not black.  She has been using Miralax daily for a couple of days (started at 1/2 capful, increased to full cap).   She is also taking metamucil tablet once daily.  She had recent UTI (prior to hospitalization). Symptoms resolved with Cipro. Urine was repeated 10/1 (after completion of ABX) in hospital and was fine. She reports some occasional burning, no urgency, frequency or hematuria.  Hyponatremia--she had a mild persistent hyponatremia (129-132) during her second hospitalization. She also had some intermittent hypokalemia.  One of the chief concerns today is that of lack of sleep and anxiety. She used to deal with her stress/anxiety by walking, but now that she cannot do that, her anxiety is worsening.  She hasn't really slept much at all, maybe just 20 minutes at night.  She is "out of her element", sleeping in the family room, with caregivers also in the home. Not napping during the day.  is quite concerned about her friend's lack of sleep, and patient about her anxiety.  She feels very restless.  She hasn't tried any sleep meds, though melatonin was not effective in the past. Her neighbor showed her some slow deep breathing techniques and imagery techniques, but that hasn't worked.  They are asking about a walker--she is borrowing one now.  She just had OT/PT evaluation.  Asking if ortho f/u is needed.  Her pain has improved a lot overall, still taking hydrocodone tablet at bedtime, usually just 1, sometimes 2.  She does not have any f/u with any ortho, asking if needed. She has no f/u scheduled with GI, but knows she is  supposed to have a 2 month f/u endoscopy and they will call to schedule.   PMH, PSH, SH reviewed  Outpatient Encounter Medications as of 09/02/2020  Medication Sig Note  . Azelaic Acid (FINACEA EX) Apply topically.   Marland Kitchen b complex vitamins capsule Take 1 capsule by mouth daily.   . calcium carbonate 1250 MG capsule Take 1,250 mg by mouth 2 (two) times daily with a meal.   . cholecalciferol (VITAMIN D) 1000 UNITS tablet Take 1,000 Units by mouth daily.   Marland Kitchen  HYDROcodone-acetaminophen (NORCO/VICODIN) 5-325 MG tablet Take 1 tablet by mouth 4 (four) times daily as needed for pain. 09/02/2020: Taking as needed, but mostly at night  . Multiple Vitamins-Minerals (MULTIVITAMIN WITH MINERALS) tablet Take 1 tablet by mouth daily.   Marland Kitchen olopatadine (PATADAY) 0.1 % ophthalmic solution Place 1 drop into both eyes daily as needed for allergies.   . pantoprazole (PROTONIX) 40 MG tablet Take 1 tablet (40 mg total) by mouth 2 (two) times daily.   . vitamin E 1000 UNIT capsule Take 1,000 Units by mouth daily.   Marland Kitchen zolpidem (AMBIEN) 5 MG tablet Take 0.5-1 tablets (2.5-5 mg total) by mouth at bedtime as needed for sleep.    No facility-administered encounter medications on file as of 09/02/2020.   (NOT taking ambien prior to today's visit)  Allergies  Allergen Reactions  . Tramadol Other (See Comments)    confusion  . Bactrim [Sulfamethoxazole-Trimethoprim] Hives    ROS: no fever, chills, URI symptoms. No headaches, chest pain, shortness of breath. No nausea, vomiting, melena, hematochezia, abdominal pain or urinary symptoms.  No bleeding, bruising, rash. She had decreased appetite, but is slowly improving. Back pain--improving, per HPI Some mild vertigo last night. No lightheadedness recently. Constipation per HPI. Intermittent burning with urination per HPI.   PHYSICAL EXAM:  BP 120/78   Pulse 72   Temp 99.7 F (37.6 C) (Tympanic)   Ht 5\' 2"  (1.575 m)   Wt 92 lb (41.7 kg)   BMI 16.83 kg/m   Wt Readings from Last 3 Encounters:  09/02/20 92 lb (41.7 kg)  08/23/20 95 lb 0.3 oz (43.1 kg)  07/28/20 100 lb (45.4 kg)   Pleasant, well-appearing, very thin female in no distress HEENT: conjunctiva and sclera are clear, EOMI Neck: no lymphadenopathy, thyromegaly or mass Heart: regular rate and rhythm Lungs: clear bilaterally Back: no spinal or CVA tenderness, no SI tenderness. No reproducible tenderness anywhere on back Abdomen: soft, normal bowel sounds.  No epigastric tenderness, no organomegaly or mass.  Mildly tender at LLQ. No suprapubic tenderness Extremities: no edema, normal pulses Psych: normal mood, affect, hygiene and grooming Neuro: alert and oriented, cranial nerves intact.  Normal strength.  Walks slowly with walker due to some discomfort. Skin: normal turgor, no rash   ASSESSMENT/PLAN:  Gastrointestinal hemorrhage associated with duodenal ulcer - s/p coil embolization and transfusions.  On PPI BID. Denies pain or recurrent bleeding. Due for recheck CBC. f/u EGD 2 mos - Plan: CBC with Differential/Platelet  Need for influenza vaccination - Plan: Flu Vaccine QUAD High Dose(Fluad)  Burning with urination - recent UTI. Recheck urine. Discussed other causes for intermittent burning, if urine normal - Plan: POCT Urinalysis DIP (Proadvantage Device)  Other iron deficiency anemia - due to recent GI bleed.  Iron not needed due to transfusion. Due for recheck CBC - Plan: CBC with Differential/Platelet  Hyponatremia - noted in hospital, due for recheck - Plan: Comprehensive metabolic panel  Drug-induced constipation - discussed miralax, water  intake, fiber (and need for water), stool softener.  Taper down hydrocodone as able  Insomnia, unspecified type - discussed risks/SE for ambien, recommend low-dose, short-term use to get back on normal sleep cycle - Plan: zolpidem (AMBIEN) 5 MG tablet  Malnutrition of moderate degree (HCC) - further weight loss related to recent illness. Appetite improving, encouraged adequate intake  Anxiety - counseled re: relaxation techniques, exercise she can do, other than walking. Breathing techniques and visualization techniques taught  Closed fracture of sacrum with routine healing, unspecified portion of sacrum, subsequent encounter - bilaterally, pain improving. Given her improvement, no ortho f/u needed at this time. Ortho if worsening pain, limitations  Other closed fracture of fifth lumbar vertebra  with routine healing, subsequent encounter - minimally displaced fracture of L5 tranverse process.  nontender on exam.  Pain gradually improving.  To let us know if they can't get walker through home health--can fax order.  F/u with GI as scheduled for endoscopy in 2 months (sooner if any recurrent bleeding or decline in Hgb).  F/u as scheduled in December  FTF 40+ minutes, plus additional time in chart review and documentation.   Constipation is contributed by the pain medication. Be sure to drink plenty of water, with goal of very pale/clear urine. Continue the fiber supplement--but with full glass of water or change to powder. Continue to take miralax daily (1 capful)--once your bowels are too frequent or too loose, you can cut back to 1/2 capful daily or less often. You may also want to take colace, a stool softener, daily, while on the pain medications.  You can try taking 1/2 tablet of ambien (zolpidem) at bedtime for a few nights to get your sleep cycle back in order.  If 1/2 tablet isn't effective (and no side effects), then you can increase to the full tablet.  After a few nights of good sleep, if you're still struggling to FALL asleep, work on the relaxation and visualization techniques we discussed to see if you can use that to help fall asleep, rather than needing medication.  Taking the Emma with the pain medication can increase the sedation risk, so use with caution.  If pain is improving, try to avoid the hydrocodone with the sleeping pill.

## 2020-09-02 ENCOUNTER — Ambulatory Visit: Payer: Medicare Other | Admitting: Family Medicine

## 2020-09-02 ENCOUNTER — Other Ambulatory Visit: Payer: Self-pay

## 2020-09-02 VITALS — BP 120/78 | HR 72 | Temp 99.7°F | Ht 62.0 in | Wt 92.0 lb

## 2020-09-02 DIAGNOSIS — E871 Hypo-osmolality and hyponatremia: Secondary | ICD-10-CM | POA: Diagnosis not present

## 2020-09-02 DIAGNOSIS — G4700 Insomnia, unspecified: Secondary | ICD-10-CM

## 2020-09-02 DIAGNOSIS — Z23 Encounter for immunization: Secondary | ICD-10-CM

## 2020-09-02 DIAGNOSIS — K264 Chronic or unspecified duodenal ulcer with hemorrhage: Secondary | ICD-10-CM | POA: Diagnosis not present

## 2020-09-02 DIAGNOSIS — E440 Moderate protein-calorie malnutrition: Secondary | ICD-10-CM

## 2020-09-02 DIAGNOSIS — R300 Dysuria: Secondary | ICD-10-CM

## 2020-09-02 DIAGNOSIS — D508 Other iron deficiency anemias: Secondary | ICD-10-CM

## 2020-09-02 DIAGNOSIS — K269 Duodenal ulcer, unspecified as acute or chronic, without hemorrhage or perforation: Secondary | ICD-10-CM | POA: Diagnosis not present

## 2020-09-02 DIAGNOSIS — S3210XD Unspecified fracture of sacrum, subsequent encounter for fracture with routine healing: Secondary | ICD-10-CM

## 2020-09-02 DIAGNOSIS — K5903 Drug induced constipation: Secondary | ICD-10-CM

## 2020-09-02 DIAGNOSIS — F419 Anxiety disorder, unspecified: Secondary | ICD-10-CM

## 2020-09-02 DIAGNOSIS — S32058D Other fracture of fifth lumbar vertebra, subsequent encounter for fracture with routine healing: Secondary | ICD-10-CM

## 2020-09-02 MED ORDER — ZOLPIDEM TARTRATE 5 MG PO TABS: 2.5000 mg | ORAL_TABLET | Freq: Every evening | ORAL | 0 refills | Status: DC | PRN

## 2020-09-02 NOTE — Patient Instructions (Signed)
Constipation is contributed by the pain medication. Be sure to drink plenty of water, with goal of very pale/clear urine. Continue the fiber supplement--but with full glass of water or change to powder. Continue to take miralax daily (1 capful)--once your bowels are too frequent or too loose, you can cut back to 1/2 capful daily or less often. You may also want to take colace, a stool softener, daily, while on the pain medications.  You can try taking 1/2 tablet of ambien (zolpidem) at bedtime for a few nights to get your sleep cycle back in order.  If 1/2 tablet isn't effective (and no side effects), then you can increase to the full tablet.  After a few nights of good sleep, if you're still struggling to FALL asleep, work on the relaxation and visualization techniques we discussed to see if you can use that to help fall asleep, rather than needing medication.  Taking the [place] with the pain medication can increase the sedation risk, so use with caution.  If pain is improving, try to avoid the hydrocodone with the sleeping pill.

## 2020-09-03 ENCOUNTER — Encounter: Payer: Self-pay | Admitting: Family Medicine

## 2020-09-03 ENCOUNTER — Telehealth: Payer: Self-pay

## 2020-09-03 LAB — COMPREHENSIVE METABOLIC PANEL
ALT: 20 IU/L (ref 0–32)
AST: 20 IU/L (ref 0–40)
Albumin/Globulin Ratio: 1.4 (ref 1.2–2.2)
Albumin: 3.6 g/dL — ABNORMAL LOW (ref 3.7–4.7)
Alkaline Phosphatase: 121 IU/L (ref 44–121)
BUN/Creatinine Ratio: 29 — ABNORMAL HIGH (ref 12–28)
BUN: 14 mg/dL (ref 8–27)
Bilirubin Total: <0.2 mg/dL (ref 0.0–1.2)
CO2: 26 mmol/L (ref 20–29)
Calcium: 9.2 mg/dL (ref 8.7–10.3)
Chloride: 95 mmol/L — ABNORMAL LOW (ref 96–106)
Creatinine, Ser: 0.48 mg/dL — ABNORMAL LOW (ref 0.57–1.00)
GFR calc Af Amer: 113 mL/min/{1.73_m2} (ref >59)
GFR calc non Af Amer: 98 mL/min/{1.73_m2} (ref >59)
Globulin, Total: 2.6 g/dL (ref 1.5–4.5)
Glucose: 109 mg/dL — ABNORMAL HIGH (ref 65–99)
Potassium: 4.8 mmol/L (ref 3.5–5.2)
Sodium: 133 mmol/L — ABNORMAL LOW (ref 134–144)
Total Protein: 6.2 g/dL (ref 6.0–8.5)

## 2020-09-03 LAB — CBC WITH DIFFERENTIAL/PLATELET
Basophils Absolute: 0.1 10*3/uL (ref 0.0–0.2)
Basos: 1 %
EOS (ABSOLUTE): 0.2 10*3/uL (ref 0.0–0.4)
Eos: 3 %
Hematocrit: 35.1 % (ref 34.0–46.6)
Hemoglobin: 11.6 g/dL (ref 11.1–15.9)
Immature Grans (Abs): 0 10*3/uL (ref 0.0–0.1)
Immature Granulocytes: 0 %
Lymphocytes Absolute: 1.6 10*3/uL (ref 0.7–3.1)
Lymphs: 21 %
MCH: 30.5 pg (ref 26.6–33.0)
MCHC: 33 g/dL (ref 31.5–35.7)
MCV: 92 fL (ref 79–97)
Monocytes Absolute: 0.8 10*3/uL (ref 0.1–0.9)
Monocytes: 10 %
Neutrophils Absolute: 5 10*3/uL (ref 1.4–7.0)
Neutrophils: 65 %
Platelets: 584 10*3/uL — ABNORMAL HIGH (ref 150–450)
RBC: 3.8 x10E6/uL (ref 3.77–5.28)
RDW: 13.9 % (ref 11.7–15.4)
WBC: 7.7 10*3/uL (ref 3.4–10.8)

## 2020-09-03 NOTE — Telephone Encounter (Signed)
Left message with verbal order. 

## 2020-09-03 NOTE — Telephone Encounter (Signed)
[place] from [facility] stating that they need a verbal order for OT time a week for 6 weeks. She can be reached at [phone].

## 2020-09-06 ENCOUNTER — Telehealth: Payer: Self-pay | Admitting: *Deleted

## 2020-09-06 DIAGNOSIS — G4700 Insomnia, unspecified: Secondary | ICD-10-CM

## 2020-09-06 DIAGNOSIS — F419 Anxiety disorder, unspecified: Secondary | ICD-10-CM

## 2020-09-06 MED ORDER — LORAZEPAM 0.5 MG PO TABS: 0.2500 mg | ORAL_TABLET | Freq: Every evening | ORAL | 0 refills | Status: DC | PRN

## 2020-09-06 NOTE — Telephone Encounter (Signed)
Left message for patient to call me back. 

## 2020-09-06 NOTE — Telephone Encounter (Signed)
Patient advised.

## 2020-09-06 NOTE — Telephone Encounter (Signed)
Advise pt that I sent in a prescription for lorazepam. I sent in the 0.5mg  dose . She should start at 1/2 tablet in the evening, and if it doesn't do anything, she can take the other half (and try the full tablet the next day). She should NOT take it with the [place], or any alcohol  (stop the [place], since it isn't effective). Main side effect is sedation (and therefore fall risk), so hopefully this will help.  It is a medication for anxiety, that should make her sleepy and hopefully do the trick!

## 2020-09-06 NOTE — Telephone Encounter (Signed)
Patient called and the Elizabeth Haynes is not helping at all. Has not slept all weekend. Her aide got on the phone and stated that she thinks she is VERY anxious. Seems like she cannot turn her brain off. She is up and down all the time and she never sleeps. Is there something she can take for anxiety?

## 2020-09-08 ENCOUNTER — Telehealth: Payer: Self-pay | Admitting: Family Medicine

## 2020-09-08 NOTE — Telephone Encounter (Signed)
Renee from home health called and said while visiting pt she told her she has not had a bm in a week. She has been taking Miralax but nothing yet. She wanted to let you know so you can address it tomorrow at her appt.

## 2020-09-08 NOTE — Telephone Encounter (Signed)
noted 

## 2020-09-09 ENCOUNTER — Encounter: Payer: Self-pay | Admitting: Family Medicine

## 2020-09-09 ENCOUNTER — Other Ambulatory Visit: Payer: Self-pay

## 2020-09-09 ENCOUNTER — Ambulatory Visit (INDEPENDENT_AMBULATORY_CARE_PROVIDER_SITE_OTHER): Payer: Medicare Other | Admitting: Family Medicine

## 2020-09-09 VITALS — BP 112/74 | HR 80 | Ht 62.0 in | Wt 92.8 lb

## 2020-09-09 DIAGNOSIS — F419 Anxiety disorder, unspecified: Secondary | ICD-10-CM | POA: Diagnosis not present

## 2020-09-09 DIAGNOSIS — G4700 Insomnia, unspecified: Secondary | ICD-10-CM | POA: Diagnosis not present

## 2020-09-09 DIAGNOSIS — R350 Frequency of micturition: Secondary | ICD-10-CM

## 2020-09-09 LAB — POCT URINALYSIS DIP (PROADVANTAGE DEVICE)
Bilirubin, UA: NEGATIVE
Glucose, UA: NEGATIVE mg/dL
Ketones, POC UA: NEGATIVE mg/dL
Leukocytes, UA: NEGATIVE
Nitrite, UA: NEGATIVE
Specific Gravity, Urine: 1.03
Urobilinogen, Ur: NEGATIVE
pH, UA: 5.5 (ref 5.0–8.0)

## 2020-09-09 MED ORDER — BUSPIRONE HCL 5 MG PO TABS: 5.0000 mg | ORAL_TABLET | Freq: Three times a day (TID) | ORAL | 0 refills | Status: DC

## 2020-09-09 MED ORDER — LORAZEPAM 0.5 MG PO TABS: 0.5000 mg | ORAL_TABLET | Freq: Three times a day (TID) | ORAL | 0 refills | Status: DC | PRN

## 2020-09-09 NOTE — Patient Instructions (Signed)
  Drink more water--aim for 4 bottles/day or more. Urine should be very pale/clear (rather than a yellow color).  For your anxiety--you can temporarily use the lorazepam once in the morning (1/2 tablet if you're doing better), and you may use up to 2 tablets at bedtime for sleep.  We are also going to start Buspar today, to help with anxiety. Take 5mg  three times daily. After a day or so ,if no side effects ,you can change it to 1.5 tablets twice daily if that is easier.  As she is taking the Buspar, if her daytime anxiety is less, you can cut back on the lorazepam during the day. You can give the lorazepam tomorrow morning, and if doing better with the buspar, the following day, try just 1/2 lorazepam in the morning, and if doing well, then next day, you can try no daytime lorazepam at all. For now, she can continue the lorazepam at bedtime--tonight try two, eventually just use the lowest effective dose (1-2).

## 2020-09-09 NOTE — Progress Notes (Signed)
Chief Complaint  Patient presents with   Numbness    in feet and legs, she feels like her feet are swollen-her aide feels like they are not. She explains it like a restless leg feeling.    Urinary Frequency    feels like she is having frequency-her aide reports that she is not going as much as she thinks.    Sleep Apnea    has been taking .5mg  tid of the lorazepam and it calming her down, but not sleeping. Tried the .5 and then 1mg  to sleep as was directed.    Patient presents today accompanied by her aid, and her friend (that was here with her last week).  She is complaining of urinary frequency.  She is no longer complaining of any burning with urination.  She has some urgency, but isn't able to go.  She is drinking 24 ounces of water daily, 1 cup of caffeine. Drinks herbal tea (not caffeinated, per pt) the rest of the day No dysuria, hematuria, incontinence.  Constipation--She is using Miralax 1/2 capful daily, and no longer taking any hydrocodone.  There was a message from home health from yesterday stating she hadn't had a bowel movement in a week. Today she reports that she has had 2 BM's today, somewhat hard, no straining. Her appetite is significantly improved.  Insomnia:  This remains a HUGE concern.  She apparently is awake all night.  She tried 5mg  and it didn't do anything.  She was then prescribed lorazepam.  It has helped her be much calmer during the day, but didn't help her sleep. Yesterday gave full tablet 8:am, 2pm, 8pm, still didn't sleep. Today she is walking more, up and down the driveway, which seems to be helping with her nerves.  She took a tablet at 8 this morning, but not needing one this afternoon. She reported it "felt great" to walk with the aide x 10 mins today.  Plans to try and increase that. She denies any pain with walking.  She thinks that part of her sleep problems is that she feels like her legs tighten up at night.  Doesn't notice this during  the day.  Moving them around helps a little, like restless legs.  She hasn't had issues like this in the past.  Legs feel tight, almost swollen, feels better to keep them moving.  PMH, PSH, SH reviewed  Outpatient Encounter Medications as of 09/09/2020  Medication Sig Note   LORazepam (ATIVAN) 0.5 MG tablet Take 1 tablet (0.5 mg total) by mouth every 8 (eight) hours as needed for anxiety or sleep. May take 2 at bedtime, if needed    pantoprazole (PROTONIX) 40 MG tablet Take 1 tablet (40 mg total) by mouth 2 (two) times daily.    [DISCONTINUED] LORazepam (ATIVAN) 0.5 MG tablet Take 0.5-1 tablets (0.25-0.5 mg total) by mouth at bedtime as needed for anxiety. 09/09/2020: Has been taking about 1.5 daily and still not sleeping   Azelaic Acid (FINACEA EX) Apply topically. (Patient not taking: Reported on 09/09/2020)    b complex vitamins capsule Take 1 capsule by mouth daily. (Patient not taking: Reported on 09/09/2020)    busPIRone (BUSPAR) 5 MG tablet Take 1 tablet (5 mg total) by mouth 3 (three) times daily.    calcium carbonate 1250 MG capsule Take 1,250 mg by mouth 2 (two) times daily with a meal. (Patient not taking: Reported on 09/09/2020)    cholecalciferol (VITAMIN D) 1000 UNITS tablet Take 1,000 Units by  mouth daily. (Patient not taking: Reported on 09/09/2020)    HYDROcodone-acetaminophen (NORCO/VICODIN) 5-325 MG tablet Take 1 tablet by mouth 4 (four) times daily as needed for pain. (Patient not taking: Reported on 09/09/2020) 09/02/2020: Taking as needed, but mostly at night   Multiple Vitamins-Minerals (MULTIVITAMIN WITH MINERALS) tablet Take 1 tablet by mouth daily. (Patient not taking: Reported on 09/09/2020)    olopatadine (PATADAY) 0.1 % ophthalmic solution Place 1 drop into both eyes daily as needed for allergies.    vitamin E 1000 UNIT capsule Take 1,000 Units by mouth daily. (Patient not taking: Reported on 09/09/2020)    zolpidem (AMBIEN) 5 MG tablet Take 0.5-1 tablets  (2.5-5 mg total) by mouth at bedtime as needed for sleep. (Patient not taking: Reported on 09/09/2020)    No facility-administered encounter medications on file as of 09/09/2020.   Allergies  Allergen Reactions   Tramadol Other (See Comments)    confusion   Bactrim [Sulfamethoxazole-Trimethoprim] Hives   ROS: no fever, chills, dysuria, hematuria, incontinence. No chest pain, shortness of breath, cough.  Leg discomfort at night per HPI, no pain/swelling noted during day. No bleeding, bruising, rash. +anxious, feels a little better since walking today, and with the lorazepam.  Denies depression.  +insomnia. Constipation improving slowly (results today).  PHYSICAL EXAM:  BP 112/74    Pulse 80    Ht 5\' 2"  (1.575 m)    Wt 92 lb 12.8 oz (42.1 kg)    BMI 16.97 kg/m   Wt Readings from Last 3 Encounters:  09/09/20 92 lb 12.8 oz (42.1 kg)  09/02/20 92 lb (41.7 kg)  08/23/20 95 lb 0.3 oz (43.1 kg)    Very thin, pleasant female, in good spirits.  She appears calm, overall anxious mood per pt, appears to have normal affect. Normal hygiene, grooming.  Standing most of the visit, periodically changing positions or sitting (for comfort). HEENT: conjunctiva and sclera are clear, EOMI, wearing mask. Noted ot have a small abrasion/bruising at R lateral eye. (fell onto glasses, no other injury, per pt) Extremities: no edema Remainder of exam limited to discussion/counseling.  Urine dip: SG 1.030, trace blood, protein   ASSESSMENT/PLAN:  Insomnia, unspecified type - partly related to anxiety, partly related to discomfort in legs.  Increasing fluid should help. Can double up on lorazepam qHS. More exercise during day helps - Plan: LORazepam (ATIVAN) 0.5 MG tablet  Urinary frequency - reassured no e/o infection. Urine very concentrated--discussed increasing water intake (which should help her leg discomfort at night) - Plan: POCT Urinalysis DIP (Proadvantage Device)  Anxiety - discussed potential  risks of lorazepam. Would like to try Buspar, start 5mg  TID, try to cut back on daytime ativan, can use qHS for sleep - Plan: busPIRone (BUSPAR) 5 MG tablet, LORazepam (ATIVAN) 0.5 MG tablet   Drink more water--aim for 4 bottles/day or more. Urine should be very pale/clear (rather than a yellow color).  For your anxiety--you can temporarily use the lorazepam once in the morning (1/2 tablet if you're doing better), and you may use up to 2 tablets at bedtime for sleep.  We are also going to start Buspar today, to help with anxiety. Take 5mg  three times daily. After a day or so ,if no side effects ,you can change it to 1.5 tablets twice daily if that is easier.  As she is taking the Buspar, if her daytime anxiety is less, you can cut back on the lorazepam during the day. You can give the lorazepam tomorrow morning,  and if doing better with the buspar, the following day, try just 1/2 lorazepam in the morning, and if doing well, then next day, you can try no daytime lorazepam at all. For now, she can continue the lorazepam at bedtime--tonight try two, eventually just use the lowest effective dose (1-2).  35+min FTF, more than 1/2 spent counseling. Additional time spent in chart review and documentation.

## 2020-09-10 ENCOUNTER — Telehealth: Payer: Self-pay | Admitting: Family Medicine

## 2020-09-10 NOTE — Telephone Encounter (Signed)
Spoke to [place] as marshayla was beside her. Advised her of Dr. Lynelle Doctor recommendations but she also wanted this in writing to look at as well. She has not taking [place] and will try that tonight.

## 2020-09-10 NOTE — Telephone Encounter (Signed)
Meds patient has tried so far (that I am aware of, please confirm) is 5mg  of zolpidem (stopped because it didn't work), and last night she was instructed to double up on her lorazepam to 1mg  total dose (2 tabs).  The new medication given yesterday wasn't expected to help with sleep, that is expected to help with anxiety during the day.  She should continue the buspar that was just started. She should continue the directions for the lorazepam during the day as given yesterday. Since the 5mg  of zolpidem wasn't effective, tonight she can try taking 2 of them.  The 10mg  dose isn't usually recommended for those over 65, but since this is only going to be short-term, she can try it tonight.  I do NOT want her taking the lorazepam along with that tonight--I don't want her to get over-sedated.  If the 10mg  dose (2 tablets of the zolpidem) do NOTHING tonight, then tomorrow she can try it along with ONE tablet of the lorazepam.  Hoping she gets to do a lot of walking today, which should also help.

## 2020-09-10 NOTE — Telephone Encounter (Signed)
Pt called with aid assisting with call. She states that none of the sleep medication she was given is working. She wants to know what else she can do. Pt uses [employer] and can be reached at [phone]. pt was informed that Dr. Lynelle Doctor was not if office today and would check her messages at some point today.  Aid called back a few minutes later and spoke to Adam and wanted message sent to one of the providers in the office to handle asap. Adam informed that due to issue and medications involved it would need to go back to her PCP to handle.

## 2020-09-11 ENCOUNTER — Encounter: Payer: Self-pay | Admitting: Family Medicine

## 2020-09-14 ENCOUNTER — Telehealth: Payer: Self-pay

## 2020-09-14 NOTE — Telephone Encounter (Signed)
Noted.  She had mentioned some numbness the other day too.  If she has persistent/worsening numbness, or associated weakness or swelling in the extremities, she needs to be seen.  At her last visit, we thought inadequate fluid intake was contributing to some of her discomfort at night. She also hadn't been sleeping--hoping that she is finally getting some sleep? If she is having worsening pain in her legs, we can also give her the option of sending her to orthopedist. She had a sacral fracture, and hasn't seen anyone in f/u for this.  Okay to refer to ortho for f/u fracture with complaints of leg weakness and numbness if desired by pt.

## 2020-09-14 NOTE — Telephone Encounter (Signed)
Elizabeth Haynes with [facility] t# [phone] states pt has been having pain in leg at night and she has had her do stretches and exercises,  Now today reports she has numbness in feet and wanted to report that to you and still having pain.

## 2020-09-15 NOTE — Telephone Encounter (Signed)
I tried to reach pt no answer,  Will try her again in the morning when I am back in the office

## 2020-09-16 ENCOUNTER — Ambulatory Visit (INDEPENDENT_AMBULATORY_CARE_PROVIDER_SITE_OTHER): Payer: Medicare Other | Admitting: Family Medicine

## 2020-09-16 ENCOUNTER — Telehealth: Payer: Self-pay | Admitting: *Deleted

## 2020-09-16 ENCOUNTER — Other Ambulatory Visit: Payer: Self-pay

## 2020-09-16 ENCOUNTER — Encounter: Payer: Self-pay | Admitting: Family Medicine

## 2020-09-16 VITALS — BP 124/80 | HR 80 | Ht 62.0 in | Wt 94.4 lb

## 2020-09-16 DIAGNOSIS — F419 Anxiety disorder, unspecified: Secondary | ICD-10-CM

## 2020-09-16 DIAGNOSIS — G4700 Insomnia, unspecified: Secondary | ICD-10-CM | POA: Diagnosis not present

## 2020-09-16 DIAGNOSIS — S3210XD Unspecified fracture of sacrum, subsequent encounter for fracture with routine healing: Secondary | ICD-10-CM | POA: Diagnosis not present

## 2020-09-16 DIAGNOSIS — S32058D Other fracture of fifth lumbar vertebra, subsequent encounter for fracture with routine healing: Secondary | ICD-10-CM

## 2020-09-16 DIAGNOSIS — M79604 Pain in right leg: Secondary | ICD-10-CM

## 2020-09-16 MED ORDER — BELSOMRA 10 MG PO TABS: ORAL_TABLET | ORAL | 0 refills | Status: DC

## 2020-09-16 MED ORDER — GABAPENTIN 300 MG PO CAPS: 300.0000 mg | ORAL_CAPSULE | Freq: Every day | ORAL | 1 refills | Status: DC

## 2020-09-16 MED ORDER — ZOLPIDEM TARTRATE 5 MG PO TABS: 5.0000 mg | ORAL_TABLET | Freq: Every evening | ORAL | 0 refills | Status: DC | PRN

## 2020-09-16 NOTE — Telephone Encounter (Signed)
Left message for pt at home, unable to reach on cell.  Also left message for Renee at [facility]

## 2020-09-16 NOTE — Telephone Encounter (Signed)
Faxed

## 2020-09-16 NOTE — Progress Notes (Signed)
Subjective:   Chief Complaint  Patient presents with  . Leg Pain    and swelling, mostly right leg, ankle and foot-thinks it might be keeping her awake at night. Wonders if TED hose would be helpful.      Elizabeth Haynes is a 74 y.o. female who presents with bilateral lower extremity numbness and tingling, and right upper leg pain since her fall in late August 2021. Upper leg pain feels like a "tightness", is worse on the right side, and keeps her up at night. Pain is relieved with movement and tylenol. Reports numbness in the feet and toes at night and denies numbness in the hands. She has increased her water intake to 3 16oz bottles of water per day per recommendations at last visit. She also reports improvement in her anxiety since starting Buspar and reports she is no longer needing Ativan for her anxiety symptoms. However, her insomnia has not improved and she is only sleeping approximately 2 hours per night after taking 10mg  of Ambien.   Review of Systems Review of Systems  Constitutional: Negative for chills, fever and weight loss.  Neurological: Positive for tingling and weakness. Negative for dizziness and headaches.  Psychiatric/Behavioral: The patient has insomnia. The patient is not nervous/anxious.   Musculoskeletal: Bilateral leg and foot pain    Objective:    BP 124/80   Pulse 80   Ht 5\' 2"  (1.575 m)   Wt 94 lb 6.4 oz (42.8 kg)   BMI 17.27 kg/m    Review of Systems - Physical Exam Constitutional:      General: She is not in acute distress.    Appearance: Normal appearance.  Cardiovascular:     Pulses: Normal pulses.     Heart sounds: Normal heart sounds. No murmur heard.   Pulmonary:     Effort: Pulmonary effort is normal.     Breath sounds: Normal breath sounds.  Musculoskeletal:     Right upper leg: Pyriformis muscle Tenderness present    Left upper leg: normal     Right lower leg: 1+ Pitting Edema present.     Left lower leg: 1+ Pitting Edema present.    Neurological:     Mental Status: She is alert and oriented to person, place, and time. Mental status is at baseline.     Motor: No weakness.  Right foot dorsiflexion: +2 Left foot dorsiflexion: +2 Right foot plantar flexion: +1 Left foot plantar flexion: +2  Psychiatric:        Mood and Affect: Mood and affect normal. Mood is not anxious.        Behavior: Behavior normal. Behavior is cooperative.     Assessment:   Patient is a 74 year old female presenting with numbness, tingling, and right leg pain likely due to pyriformis muscle tightness. She was given stretches to be done at home to relieve pain 1-2 times a day. Herniated disk and vitamin B12 deficiency were considered as potential causes of her bilateral lower extremity numbness and tingling, but are unlikely due to the following pertinent negatives: jaundice cognitive slowing, and glossitis.     Plan:   Patient given exercises to stretch paraformis muscle Sit on bottom and stretch after using heat Stretches twice a day and at night if/when you are hurting  Anxiety- continue buspar  Gabapentin at bedtime 300mg  at QHS  Compression stockings during day and elevating legs at night  Sleep: Belsamra d/t ambien not working will start with 10mg  then titrate up  Follow-up in 2 weeks

## 2020-09-16 NOTE — Telephone Encounter (Signed)
Okay for rx

## 2020-09-16 NOTE — Progress Notes (Signed)
Chief Complaint  Patient presents with   Leg Pain    and swelling, mostly right leg, ankle and foot-thinks it might be keeping her awake at night. Wonders if TED hose would be helpful.    Patient's anxiety has improved with use of Buspar.  She no longer takes lorazepam.  She is still struggling with sleep--taking 10mg  of Ambien only gives her sleep for a short while.  Discomfort in her legs may contribute some to her issues with sleep. She is describing tightness in her legs, which feels better if she moves them, has also  Noted some swelling in her feet/ankles and some intermittent tingling.  She does feel like there is some weakness on the R side. She is getting therapy. Hasn't get gotten her own walker (using a friend's).  See FNP student's note for details.  PMH, PSH, SH reviewed Medications and allergies reviewed.  ROS: no fever, chills, URI symptoms, urinary complaints, chest pain, shortness of breath.  Insomnia, anxiety, leg pain, tingling per HPI. Appetite has improved, bowels are normal.  PHYSICAL EXAM:  BP 124/80    Pulse 80    Ht 5\' 2"  (1.575 m)    Wt 94 lb 6.4 oz (42.8 kg)    BMI 17.27 kg/m   Wt Readings from Last 3 Encounters:  09/16/20 94 lb 6.4 oz (42.8 kg)  09/09/20 92 lb 12.8 oz (42.1 kg)  09/02/20 92 lb (41.7 kg)   Pleasant, well-appearing thin female, in good spirits. HEENT: conjunctiva and sclera are clear, EOMI, wearing mask Heart: regular rate and rhythm Lungs: clear Back: no spinal tenderness, SI tenderness, muscle spasm, CVA tenderness. She is noted to have tight pyriformis on R, with decreased range of motion and discomfort with stretch, normal on the left. Neuro: alert and oriented, normal gait with walker. Slight weakness of plantarflexion R (no drift/drop when standing on toes) Normal pulses Trace edema at ankle. [place] nontender Psych: normal mood, affect, hygiene and grooming  ASSESSMENT/PLAN:  Right leg pain - suspect some pyriformis  syndrome and tightness.  Shown stretches. Some tingling, pain, and recent fracture, will try gabapentin qHS - Plan: gabapentin (NEURONTIN) 300 MG capsule  Insomnia, unspecified type - 10mg  ambien minimally effective. Belsomra is a safer long-term choice; will need prior auth, so given Ambien rx to cover until she can get belsomra. - Plan: Suvorexant (BELSOMRA) 10 MG TABS, zolpidem (AMBIEN) 5 MG tablet  Anxiety - improved, continue buspar BID  Closed fracture of sacrum with routine healing, unspecified portion of sacrum, subsequent encounter - Seems to be healing well overall. Cont PT  If ongoing problems with leg pains, discussed referral to ortho for f/u, declines for now  Other closed fracture of fifth lumbar vertebra with routine healing, subsequent encounter   Take Belsomra 10mg  at bedtime. If after 5-7 days there is no effect you can increase to 1.5 tablet at bedtime, and after 5-7 days you can further increase to a maximum dose of 20mg  at night.  We can then change the prescription to the higher dose pill, if needed.  Don't use Ambien while adding these new medications.  If you absolutely aren't sleeping, we may be able to add back the 5mg  (and continue the belsomra) short-term.  We are going to have you take gabapentin 300mg  at bedtime.  This is a medication to help with pain  It sometimes also causes grogginess; since your pain is just at night, we will only have you take it at bedtime.  Continue the  Buspar, since it seems to help.  35 min FTF, additional 5-10 mins spent in chart review and documentation

## 2020-09-16 NOTE — Telephone Encounter (Signed)
[place] from [facility] called asking for a referral to be faxed to [facility] for patient for a front wheeled walker.  Her number is [phone] if needed.

## 2020-09-16 NOTE — Patient Instructions (Addendum)
Take Belsomra 10mg  at bedtime. If after 5-7 days there is no effect you can increase to 1.5 tablet at bedtime, and after 5-7 days you can further increase to a maximum dose of 20mg  at night.  We can then change the prescription to the higher dose pill, if needed.  Don't use Ambien while adding these new medications.  If you absolutely aren't sleeping, we may be able to add back the 5mg  (and continue the belsomra) short-term.  The Belsomra may need authorization from your insurance, and you may not be able to start this tonight.  If this is the case, you may use 1 of the 5mg  ambien tablets at bedtime (along with the the gabapentin).   We are going to have you take gabapentin 300mg  at bedtime.  This is a medication to help with pain  It sometimes also causes grogginess; since your pain is just at night, we will only have you take it at bedtime.  Continue the Buspar, since it seems to help.  Do the stretches for the pyriformis muscle as shown (in bed or seated) at least twice daily.  Do it at night if/when you're hurting.  I do recommend wearing compression stockings during the day, and keeping leg elevated if there is swelling.

## 2020-09-16 NOTE — Telephone Encounter (Signed)
Renee called back & had pt in background. Still having leg pain, some swelling, and weakness & also not sleeping due to this.  I recommended pt be seen here or appt with ortho she wanted to come in here.

## 2020-09-17 DIAGNOSIS — M79604 Pain in right leg: Secondary | ICD-10-CM | POA: Diagnosis not present

## 2020-09-20 ENCOUNTER — Telehealth: Payer: Self-pay

## 2020-09-22 ENCOUNTER — Encounter: Payer: Medicare Other | Admitting: Family Medicine

## 2020-09-22 ENCOUNTER — Telehealth: Payer: Self-pay | Admitting: *Deleted

## 2020-09-22 MED ORDER — GABAPENTIN 100 MG PO CAPS: ORAL_CAPSULE | ORAL | 0 refills | Status: DC

## 2020-09-22 NOTE — Telephone Encounter (Signed)
Can you call pt-- She was started on gabapentin. I wonder if it could be a side effect (see if she thinks that could be the case, if maybe she didn't take it the one night she felt fine?). If so, we can try a lower dose to start out, ease up to the 300mg  by having her start on 100mg  capsules and working her way up to 300mg , if tolerated.  Ensure she has no pain in her stomach--not sure to touch, and that her bowels are normal (not black/bloody).

## 2020-09-22 NOTE — Telephone Encounter (Signed)
P.A. BELSOMRA denied, pt must try 2 formulary alternatives and only one has been tried Zolpidem, other alternatives are Doxepin, Ramelteon, Temazepam, Zaleplon. Can pt be switched to covered alternative?

## 2020-09-22 NOTE — Telephone Encounter (Signed)
Spoke with patient and aide on speaker phone-she does not have stomach pain, not tender to the touch and bowel movements are normal. They do feel that this is a side effect from the gabapentin, although she has taken nightly since prescribed. She would like to try the 100mg  and ease up to the 300mg .

## 2020-09-22 NOTE — Telephone Encounter (Signed)
Therapist from [facility] called and wanted to let you know that the aides at the house that were there today when she went out to do therapy reported to her that patient has been in bed most of the day with extreme nausea and lack of appetite. She was ok yesterday, but had same symptoms Sunday and Monday-she just wanted to let you know being that she had a GI bleed.

## 2020-09-23 ENCOUNTER — Telehealth: Payer: Self-pay | Admitting: *Deleted

## 2020-09-23 DIAGNOSIS — M6281 Muscle weakness (generalized): Secondary | ICD-10-CM | POA: Diagnosis not present

## 2020-09-23 DIAGNOSIS — R2689 Other abnormalities of gait and mobility: Secondary | ICD-10-CM | POA: Diagnosis not present

## 2020-09-23 DIAGNOSIS — R262 Difficulty in walking, not elsewhere classified: Secondary | ICD-10-CM | POA: Diagnosis not present

## 2020-09-23 NOTE — Telephone Encounter (Signed)
She also tried lorazepam, at fairly high dose, and didn't do anything. So, I don't really want to try another benzodiazepine--especially in an elderly female who is a HUGE fall risk, given her recent fall and fracture.  Doxepin is also not really recommended for elderly, for the same reason (petite female who just fell and suffered sacral fracture that she is recovering from).   She is getting short-lived benefit from 10mg  ambien, but this is recommended for her age, and if insomnia becomes more long-term, Belsomra is a much safer option. I recommend giving them this additional info for an appear (she is very high risk for fall/injury, given she is already needing a walker!), and we can always try some samples if needed if she runs out of ambien before appeal is back.  Thanks

## 2020-09-23 NOTE — Telephone Encounter (Signed)
Patient did not take the gabapentin last night-felt like it was what was making her so violently ill. She said she had been very nauseous and vomiting. Last night she decided not to take any gabapentin and feels 100% better today. She took [place] and tylenol last night. She said she knows it has only been one night but she feel better overall and is eating again. She will keep [place] posted.

## 2020-09-28 ENCOUNTER — Telehealth: Payer: Self-pay

## 2020-09-28 NOTE — Telephone Encounter (Signed)
Pt. Called wanting to know if Buspirone she could stop taking it or does she need to wing off of that medication slowly. She thinks it is causing her dizziness.

## 2020-09-28 NOTE — Telephone Encounter (Signed)
Pt. Aware to talk 1/2 tablet and will see you Thursday.

## 2020-09-28 NOTE — Telephone Encounter (Signed)
Have her cut the dose in 1/2 and see if that helps.  She has appt with me Thursday. We can discuss further then

## 2020-09-29 NOTE — Progress Notes (Signed)
Chief Complaint  Patient presents with  . Follow-up    follow up on anxiety and insomnia. Sleeping better, only taking tylenol for pain. Taking 1/2 tab TID of bupspar and would like to taper off. Feels much better. Would like to endoscopy, to follow up with Elizabeth Haynes at [facility].   . Immunizations    had at [facility] call with date.    Patient presents for follow-up on anxiety, insomnia, leg pain.  She is accompanied by her friend Elizabeth Haynes. She is asking if/when she is cleared to drive.  She is feeling much better.  Patient reported nausea and decreased appetite, which she felt was related to the gabapentin 300mg  that was started.  She skipped it one night and felt better the next day.  We sent in lower dose, to change to 100mg . She hasn't started taking these yet.  She is no longer having the pain in her R leg.  She also asked about whether her Buspar could be causing dizziness.  She was advised earlier this week to try cutting the dose in half until her visit. She described feeling a little woozy, swaying with walking. PT is working on balance with her. She is using a walker, and they reportedly working on transitioning to a cane.  Walking has helped her anxiety, walking 15-20 minutes. She is getting regular exercise, even started back with some light weights  She feels less anxious, and is sleeping better. She hasn't taken any ambien in about a week. She is falling asleep fine, getting back to bed after going to the bathroom.  Hospital bed is gone, back to sleeping in her bedroom, in a much more normal routine.  Got a LifeAlert bracelet.  Nighttime aide won't be coming after tomorrow. Daytime aid is cut back to 6 hours, only for another week.  Constipation follow-up:  She reports she is having bowel movements every 4-5 days.  She has not been using the miralax consistently, just sporadically taking 1/2 capful.    PMH, PSH, SH reviewed  Outpatient Encounter Medications as of 09/30/2020   Medication Sig Note  . acetaminophen (TYLENOL) 500 MG tablet Take 500 mg by mouth every 6 (six) hours as needed.   . Azelaic Acid (FINACEA EX) Apply topically.    b complex vitamins capsule Take 1 capsule by mouth daily.    . busPIRone (BUSPAR) 5 MG tablet Take 1 tablet (5 mg total) by mouth 3 (three) times daily. 09/30/2020: Taking 1/2 tid  . calcium carbonate 1250 MG capsule Take 1,250 mg by mouth 2 (two) times daily with a meal.    . cholecalciferol (VITAMIN D) 1000 UNITS tablet Take 1,000 Units by mouth daily.    Marland Kitchen MAGNESIUM GLYCINATE PO Take 1 tablet by mouth in the morning, at noon, and at bedtime.   . Multiple Vitamins-Minerals (MULTIVITAMIN WITH MINERALS) tablet Take 1 tablet by mouth daily.    13/02/2020 olopatadine (PATADAY) 0.1 % ophthalmic solution Place 1 drop into both eyes daily as needed for allergies.   . pantoprazole (PROTONIX) 40 MG tablet Take 1 tablet (40 mg total) by mouth 2 (two) times daily.   . vitamin E 1000 UNIT capsule Take 1,000 Units by mouth daily.    Marland Kitchen gabapentin (NEURONTIN) 100 MG capsule Take 1 capsule by mouth at bedtime; increase to 2 caps after a week, and then up to 3 caps after a week, if tolerating. (Patient not taking: Reported on 09/30/2020)   . gabapentin (NEURONTIN) 300 MG capsule Take 1  capsule (300 mg total) by mouth at bedtime. (Patient not taking: Reported on 09/30/2020)   . Suvorexant (BELSOMRA) 10 MG TABS Start 1 tablet by mouth at bedtime; titrate up to 1.5 tablets, and further to 2 tablets as directed if needed (Patient not taking: Reported on 09/30/2020)   . zolpidem (AMBIEN) 5 MG tablet Take 1 tablet (5 mg total) by mouth at bedtime as needed for sleep. (Patient not taking: Reported on 09/30/2020)    No facility-administered encounter medications on file as of 09/30/2020.   Allergies  Allergen Reactions  . Tramadol Other (See Comments)    confusion  . Bactrim [Sulfamethoxazole-Trimethoprim] Hives    ROS:  No fever, chills, URI symptoms. No chest  pain, shortness of breath.  Nausea resolved.  +constipation per HPI. Dizziness is sporadic, mild. Leg pain and Insomnia resolved. Anxiety is much improved She still has some mild swelling at ankles, is wearing TED hose which helps.   PHYSICAL EXAM:  BP 140/80   Pulse 60   Ht 5\' 2"  (1.575 m)   Wt 94 lb 9.6 oz (42.9 kg)   BMI 17.30 kg/m   Wt Readings from Last 3 Encounters:  09/30/20 94 lb 9.6 oz (42.9 kg)  09/16/20 94 lb 6.4 oz (42.8 kg)  09/09/20 92 lb 12.8 oz (42.1 kg)   Pleasant, well-appearing thin female, in good spirits. HEENT: conjunctiva and sclera are clear, EOMI, wearing mask Heart: regular rate and rhythm Lungs: clear Back: no spinal tenderness, SI tenderness, muscle spasm, CVA tenderness. No longer has any pain or decreased ROM with pyriformis stretch. Neuro: alert and oriented, normal gait with walker. Extremitie: Normal pulses. Trace edema at ankle, wearing TED hose. Calves nontender Psych: normal mood, affect, hygiene and grooming Neuro: alert and oriented, normal gait, using walker   ASSESSMENT/PLAN:  Gastrointestinal hemorrhage associated with duodenal ulcer - Plan: Ambulatory referral to Gastroenterology  Anxiety - significantly improved, discussed how to cut back on buspar  Insomnia, unspecified type - resolved after anxiety improved, back in her bedroom. No longer taking Ambien, Belsomra not needed (will stop appeal)  Closed fracture of sacrum with routine healing, unspecified portion of sacrum, subsequent encounter - no longer having pain.  Doing well with PT, now working on balance and transitioning to cane  We discussed her driving question--she is no longer on sedating medications that would put her at risk.  09/11/20 reports that she would not be driving alone, friends would go with her to ensure her safety.   Recommendation was made for her to get the okay from therapist working with her. Concerns would be reaction times (likely fine), and whether she  is still having any wooziness as she had reported.  Stopping gabapentin since leg pain resolved. Stopping ambien and no longer working to get Elizabeth Haynes approved, as insomnia resolved. Anxiety significantly improved, will start cutting back on Buspar (leave at lowest effective dose, if any is needed).  I spent 34 minutes dedicated to the care of this patient, including pre-visit review of records, face to face time, post-visit orders and documentation.   Do not take the gabapentin (either dose), as you no longer need it.  Take 1/2 tablet of buspar twice daily.  Next week try just taking 1/2 tablet once daily (during the day if that is when you have the most anxiety).  You can take a second dose (1/2 tablet) if needed later in the day. If still doing well without any recurrent anxiety, the following week you can try without  any (and use it just if needed, 1/2 tablet).

## 2020-09-30 ENCOUNTER — Other Ambulatory Visit: Payer: Self-pay

## 2020-09-30 ENCOUNTER — Encounter: Payer: Self-pay | Admitting: Family Medicine

## 2020-09-30 ENCOUNTER — Ambulatory Visit (INDEPENDENT_AMBULATORY_CARE_PROVIDER_SITE_OTHER): Payer: Medicare Other | Admitting: Family Medicine

## 2020-09-30 VITALS — BP 140/80 | HR 60 | Ht 62.0 in | Wt 94.6 lb

## 2020-09-30 DIAGNOSIS — F419 Anxiety disorder, unspecified: Secondary | ICD-10-CM

## 2020-09-30 DIAGNOSIS — G4700 Insomnia, unspecified: Secondary | ICD-10-CM | POA: Diagnosis not present

## 2020-09-30 DIAGNOSIS — W19XXXD Unspecified fall, subsequent encounter: Secondary | ICD-10-CM | POA: Diagnosis not present

## 2020-09-30 DIAGNOSIS — S3992XD Unspecified injury of lower back, subsequent encounter: Secondary | ICD-10-CM | POA: Diagnosis not present

## 2020-09-30 DIAGNOSIS — Z79891 Long term (current) use of opiate analgesic: Secondary | ICD-10-CM | POA: Diagnosis not present

## 2020-09-30 DIAGNOSIS — K264 Chronic or unspecified duodenal ulcer with hemorrhage: Secondary | ICD-10-CM

## 2020-09-30 DIAGNOSIS — M810 Age-related osteoporosis without current pathological fracture: Secondary | ICD-10-CM | POA: Diagnosis not present

## 2020-09-30 DIAGNOSIS — N390 Urinary tract infection, site not specified: Secondary | ICD-10-CM | POA: Diagnosis not present

## 2020-09-30 DIAGNOSIS — Z9181 History of falling: Secondary | ICD-10-CM | POA: Diagnosis not present

## 2020-09-30 DIAGNOSIS — K2950 Unspecified chronic gastritis without bleeding: Secondary | ICD-10-CM | POA: Diagnosis not present

## 2020-09-30 DIAGNOSIS — S3210XD Unspecified fracture of sacrum, subsequent encounter for fracture with routine healing: Secondary | ICD-10-CM

## 2020-09-30 DIAGNOSIS — D62 Acute posthemorrhagic anemia: Secondary | ICD-10-CM | POA: Diagnosis not present

## 2020-09-30 NOTE — Patient Instructions (Signed)
  Do not take the gabapentin (either dose), as you no longer need it.  Take 1/2 tablet of buspar twice daily.  Next week try just taking 1/2 tablet once daily (during the day if that is when you have the most anxiety).  You can take a second dose (1/2 tablet) if needed later in the day. If still doing well without any recurrent anxiety, the following week you can try without any (and use it just if needed, 1/2 tablet).  Discuss your driving with the therapist to ensure they agree that it would be safe.

## 2020-09-30 NOTE — Telephone Encounter (Signed)
Letter of appeal faxed to [facility]

## 2020-10-05 NOTE — Telephone Encounter (Signed)
P.A. approved til 07/04/21 #30 for 30.  Per Dr. Lynelle Doctor pt not taking at this time

## 2020-10-11 ENCOUNTER — Telehealth: Payer: Self-pay | Admitting: *Deleted

## 2020-10-11 NOTE — Telephone Encounter (Signed)
Patient is scheduled this Wed to have mammo at [facility], she would like to have DEXA done as well-asking for order to be sent. No longer sees GYN.

## 2020-10-11 NOTE — Telephone Encounter (Signed)
Faxed

## 2020-10-11 NOTE — Telephone Encounter (Signed)
Ok to fax order [employer] for [employer].  Not sure that she has been treated for osteoporosis, which is recommended, so likely will need OV to discuss once results are back

## 2020-10-12 ENCOUNTER — Telehealth: Payer: Self-pay

## 2020-10-12 NOTE — Telephone Encounter (Signed)
Ok for OT orders

## 2020-10-12 NOTE — Telephone Encounter (Signed)
Darl Pikes from [facility] called for verbal orders for OT 1 time a week for 4 weeks. She can be reached at [phone].

## 2020-10-13 DIAGNOSIS — M8589 Other specified disorders of bone density and structure, multiple sites: Secondary | ICD-10-CM | POA: Diagnosis not present

## 2020-10-13 DIAGNOSIS — Z1231 Encounter for screening mammogram for malignant neoplasm of breast: Secondary | ICD-10-CM | POA: Diagnosis not present

## 2020-10-13 DIAGNOSIS — M810 Age-related osteoporosis without current pathological fracture: Secondary | ICD-10-CM | POA: Diagnosis not present

## 2020-10-13 LAB — HM DEXA SCAN

## 2020-10-13 LAB — HM MAMMOGRAPHY

## 2020-10-13 NOTE — Telephone Encounter (Signed)
Verbal orders given  

## 2020-10-18 ENCOUNTER — Encounter: Payer: Self-pay | Admitting: Family Medicine

## 2020-10-25 ENCOUNTER — Encounter: Payer: Self-pay | Admitting: Family Medicine

## 2020-10-27 ENCOUNTER — Encounter: Payer: Self-pay | Admitting: Family Medicine

## 2020-10-28 ENCOUNTER — Ambulatory Visit: Payer: Medicare Other | Admitting: Family Medicine

## 2020-10-29 DIAGNOSIS — K269 Duodenal ulcer, unspecified as acute or chronic, without hemorrhage or perforation: Secondary | ICD-10-CM | POA: Diagnosis not present

## 2020-10-29 DIAGNOSIS — Z1159 Encounter for screening for other viral diseases: Secondary | ICD-10-CM | POA: Diagnosis not present

## 2020-10-30 DIAGNOSIS — M810 Age-related osteoporosis without current pathological fracture: Secondary | ICD-10-CM | POA: Diagnosis not present

## 2020-10-30 DIAGNOSIS — K264 Chronic or unspecified duodenal ulcer with hemorrhage: Secondary | ICD-10-CM | POA: Diagnosis not present

## 2020-10-30 DIAGNOSIS — Z87440 Personal history of urinary (tract) infections: Secondary | ICD-10-CM | POA: Diagnosis not present

## 2020-10-30 DIAGNOSIS — E440 Moderate protein-calorie malnutrition: Secondary | ICD-10-CM | POA: Diagnosis not present

## 2020-10-30 DIAGNOSIS — S3992XD Unspecified injury of lower back, subsequent encounter: Secondary | ICD-10-CM | POA: Diagnosis not present

## 2020-10-30 DIAGNOSIS — M8580 Other specified disorders of bone density and structure, unspecified site: Secondary | ICD-10-CM | POA: Diagnosis not present

## 2020-10-30 DIAGNOSIS — Z9181 History of falling: Secondary | ICD-10-CM | POA: Diagnosis not present

## 2020-10-30 DIAGNOSIS — D62 Acute posthemorrhagic anemia: Secondary | ICD-10-CM | POA: Diagnosis not present

## 2020-10-30 DIAGNOSIS — Z79899 Other long term (current) drug therapy: Secondary | ICD-10-CM | POA: Diagnosis not present

## 2020-10-30 DIAGNOSIS — K2950 Unspecified chronic gastritis without bleeding: Secondary | ICD-10-CM | POA: Diagnosis not present

## 2020-10-30 DIAGNOSIS — Z79891 Long term (current) use of opiate analgesic: Secondary | ICD-10-CM | POA: Diagnosis not present

## 2020-10-30 DIAGNOSIS — W19XXXD Unspecified fall, subsequent encounter: Secondary | ICD-10-CM | POA: Diagnosis not present

## 2020-11-01 ENCOUNTER — Telehealth: Payer: Self-pay | Admitting: *Deleted

## 2020-11-01 NOTE — Telephone Encounter (Signed)
Tresa Endo with [facility] called asking for verbal orders to extend Home PT for 4 more week 1 time a week for balance, independence in home and safety.  Call back-[phone]

## 2020-11-01 NOTE — Telephone Encounter (Signed)
ok 

## 2020-11-01 NOTE — Telephone Encounter (Signed)
Orders given.  

## 2020-11-03 ENCOUNTER — Encounter: Payer: Self-pay | Admitting: *Deleted

## 2020-11-08 ENCOUNTER — Telehealth: Payer: Self-pay | Admitting: Family Medicine

## 2020-11-09 ENCOUNTER — Telehealth: Payer: Self-pay

## 2020-11-09 NOTE — Telephone Encounter (Signed)
Darl Pikes from [facility] called for verbal orders for OT for 1 time a week for 4 weeks. She can be reached at [phone].

## 2020-11-09 NOTE — Telephone Encounter (Signed)
ok 

## 2020-11-10 NOTE — Telephone Encounter (Signed)
Message left with verbal orders.

## 2020-11-22 ENCOUNTER — Telehealth: Payer: Self-pay | Admitting: Family Medicine

## 2020-11-22 NOTE — Telephone Encounter (Signed)
Kelly with [facility] called and states she has been discharged from PT and recommends outpt PT and wants you to discuss outpatient PT with patient at her upcoming appointment.

## 2020-11-24 NOTE — Progress Notes (Signed)
Chief Complaint  Patient presents with  . Annual Exam    Annual exam, patient is not fasting but will come back if labs needed. Last saw Dr. Ernestina Penna in 2019 would like to do breast and pelvic here from now on as she does not plan on returning to see Dr. Ernestina Penna. Still has continuing vertigo and neuropathy in her feet.     Elizabeth Haynes is a 74 y.o. female who presents for annual physical exam. She is past due for Medicare AWV (last in 2019), but insurance doesn't allow these to be done the same day (BC[employer]).  We have seen her frequently in the last few months related to a fall on 07/27/20, sacral fracture, minimally displaced Left L5 transverse fracture process.  This was followed by admission in September for GI bleed--gastric and duodenal ulcers, with coil embolization of duodenal ulcer. She subsequently had issues with anxiety and insomnia.  These things improved once she was more active, able to get exercise, and was back to sleeping in her own room. At one point she was treated with lorazepam, ambien, buspar, gabapentin. She also had some issues with constipation, requiring miralax use. She no longer needs miralax, is controlled with magnesium.  She has f/u EGD planned for 01/10/2021.  She recently completed PT/OT.  She reports she still has some dizziness, like equilibrium is off (not true spinning), like she "just got off a boat".  She has some mild allergies, which has sometimes contributed to vertigo in the past.  She is using zyrtec and flonase.  She continues to have a lot of PND. Mucus is clear. Denies sinus or ear pain.  She has tingling and numbness in her feet, along the outer part of both feet and the toes. She isn't sure if this contributes to her equilibrium.  Denies any numbness/tingling in fingers.  H/o osteopenia/osteoporosis:  Previously took Fosamax for about 5 years (stopped around 2008).  DEXA was 08/2014 and showed decline in hips, T-2.4.  This was last checked 09/2016 by  her GYN, showing further decline in all areas, most notably in Spine, where T went from -2.4 to -3.2.  She reports they put her on fosamax, took it for a year, then took herself off of it.  "I do not like that drug".  She had a reaction to oral surgery, had to take antibiotics, which caused GI issues; she thought fosamax also caused GI issues (diarrhea).    Last DEXA was 09/2020. T-3.2 at spine, and -2.5 at L femoral neck.  She was scheduled to come for a separate visit to discuss treatment options, but she cancelled this.  Previously DEXA's had been ordered/discussed with her GYN. She doesn't intend to f/u with her GYN. She is taking calcium and D supplements and using weights (slowly getting back to them).  H/o hyperlipidemia, improved with dietary changes. She isn't fasting today. Lab Results  Component Value Date   CHOL 223 (H) 07/08/2018   HDL 96 07/08/2018   LDLCALC 120 (H) 07/08/2018   TRIG 35 07/08/2018   CHOLHDL 2.3 07/08/2018   Allergies: seasonally in spring and fall. Still flaring now (leaves, warmer weather).  Taking zyrtec and flonase. She has runny eyes.  She is scheduled to see eye doctor next month.    Immunization History  Administered Date(s) Administered  . Fluad Quad(high Dose 65+) 09/02/2020  . Influenza Split 08/11/2013, 08/16/2015  . Influenza, High Dose Seasonal PF 09/26/2018  . PFIZER SARS-COV-2 Vaccination 02/10/2020, 03/02/2020, 09/17/2020  .  Pneumococcal Conjugate-13 04/01/2015  . Pneumococcal Polysaccharide-23 07/15/2012  . Tdap 07/15/2012  . Zoster 11/10/2009  . Zoster Recombinat (Shingrix) 04/02/2017, 06/02/2017   Last Pap smear: here 03/2015, negative, with no high risk HPV. She sees Dr. Algie Coffer, (no pap done since then). Patient prefers not do get any more done. Last mammogram:  09/2020 Last colonoscopy: never. Had negative Cologuard in 11/2018 Last DEXA: 09/2020 T-3.2 spine, -2.5 L fem neck. Dentist: twice yearly Ophtho: scheduled for next month,  goes yearly (missed over COVID) Exercise:walks the dog 30 minutes daily (slower than in the past). Does weight routine every other day (3 and 5#, plans to work back up to her prior weights of 5 and 10#) Vitamin D screen: 76.8 in 06/2018   Fall screen: one, injury as reported above. Depression screen: negative   PMH, PSH, SH and FH were reviewed  Outpatient Encounter Medications as of 11/25/2020  Medication Sig Note  . acetaminophen (TYLENOL) 500 MG tablet Take 500 mg by mouth every 6 (six) hours as needed. 11/25/2020: Takes BID (not prn, taking regularly)  . Azelaic Acid (FINACEA EX) Apply topically.    Marland Kitchen b complex vitamins capsule Take 1 capsule by mouth daily.    . calcium carbonate 1250 MG capsule Take 1,250 mg by mouth daily.   . cholecalciferol (VITAMIN D) 1000 UNITS tablet Take 1,000 Units by mouth daily.    Marland Kitchen MAGNESIUM GLYCINATE PO Take 1 tablet by mouth in the morning, at noon, and at bedtime.   . Multiple Vitamins-Minerals (MULTIVITAMIN WITH MINERALS) tablet Take 1 tablet by mouth daily.    Marland Kitchen olopatadine (PATANOL) 0.1 % ophthalmic solution Place 1 drop into both eyes daily as needed for allergies.   . pantoprazole (PROTONIX) 40 MG tablet Take 1 tablet (40 mg total) by mouth 2 (two) times daily.   . vitamin E 1000 UNIT capsule Take 1,000 Units by mouth daily.    . [DISCONTINUED] busPIRone (BUSPAR) 5 MG tablet Take 1 tablet (5 mg total) by mouth 3 (three) times daily. 09/30/2020: Taking 1/2 tid  . [DISCONTINUED] gabapentin (NEURONTIN) 100 MG capsule Take 1 capsule by mouth at bedtime; increase to 2 caps after a week, and then up to 3 caps after a week, if tolerating. (Patient not taking: Reported on 09/30/2020)   . [DISCONTINUED] gabapentin (NEURONTIN) 300 MG capsule Take 1 capsule (300 mg total) by mouth at bedtime. (Patient not taking: Reported on 09/30/2020)   . [DISCONTINUED] Suvorexant (BELSOMRA) 10 MG TABS Start 1 tablet by mouth at bedtime; titrate up to 1.5 tablets, and further  to 2 tablets as directed if needed (Patient not taking: No sig reported)   . [DISCONTINUED] zolpidem (AMBIEN) 5 MG tablet Take 1 tablet (5 mg total) by mouth at bedtime as needed for sleep. (Patient not taking: Reported on 09/30/2020)    No facility-administered encounter medications on file as of 11/25/2020.   Allergies  Allergen Reactions  . Tramadol Other (See Comments)    confusion  . Bactrim [Sulfamethoxazole-Trimethoprim] Hives    ROS: The patient denies anorexia, fever, weight changes, headaches, vision changes, decreased hearing, ear pain, sore throat, breast concerns, chest pain, palpitations, dizziness, syncope, dyspnea on exertion, cough, swelling, nausea, vomiting, diarrhea, constipation, abdominal pain, melena, hematochezia, indigestion/heartburn, hematuria, incontinence, dysuria, vaginal bleeding, discharge, odor or itch, genital lesions, joint pains, numbness, tingling, weakness, tremor, suspicious skin lesions, depression, anxiety, abnormal bleeding/bruising, or enlarged lymph nodes.  Sees dermatologist--no mole changes or concerns. Rosacea is flaring some.  No significant hot flashes.  Equilibrium issues per HPI. Numbness/tingling on the outer part and toes of both feet (?since her fall), fluctuates in severity some; not painful/bothersome.   PHYSICAL EXAM:  BP (!) 92/58   Pulse 64   Ht 5\' 1"  (1.549 m)   Wt 98 lb 3.2 oz (44.5 kg)   BMI 18.55 kg/m   Wt Readings from Last 3 Encounters:  11/25/20 98 lb 3.2 oz (44.5 kg)  09/30/20 94 lb 9.6 oz (42.9 kg)  09/16/20 94 lb 6.4 oz (42.8 kg)    General Appearance:   Alert, cooperative, no distress, appears stated age.  Head:   Normocephalic, without obvious abnormality, atraumatic  Eyes:   PERRL, conjunctiva/corneas clear, EOM's intact, fundi benign  Ears:   Bilateral cerumen impaction  Nose:  Mod edema of nasal mucosa L>R, no erythema or purulence; sinuses NT  Throat:  Not examined, wearing mask due to  COVID-19 pandemic.  Neck:  Supple, no lymphadenopathy; thyroid: noenlargement/tenderness/ nodules; no carotid bruit or JVD  Back:  Spine nontender, no curvature, ROM normal, no CVA tenderness  Lungs:   Clear to auscultation bilaterally without wheezes, rales or ronchi; respirations unlabored  Chest Wall:   No tenderness or deformity  Heart:   Regular rate and rhythm, S1 and S2 normal, no murmur, rub or gallop  Breast Exam:   No nipple discharge, inversion, breast masses or axillary lymphadenopathy  Abdomen:   Soft, non-tender, nondistended, normoactive bowel sounds, no masses, no hepatosplenomegaly  Genitalia:  normal external genitalia, atrophic changes noted.  Slight guarding on exam, but no abnormalities noted (nontender, just unable to relax)  Rectal:   Heme negative stool, no masses.  Small hemorrhoidal tag on left, nontender  Extremities:  No clubbing, cyanosis or edema. Bunion deformities bilaterally  Pulses:  2+ and symmetric all extremities  Skin:  Skin color, texture, turgor normal. Many large SK's scattered around trunk, arms. Purpura noted on L hand, R forearm  Lymph nodes:  Cervical, supraclavicular, and axillary nodes normal  Neurologic:  CNII-XII intact, normal strength, sensation and gait; reflexes 2+ and symmetric throughout. Normal sensation to light touch and temperature, subjectively felt a little different along the lateral aspects of both feet.   Psych: Normal mood, affect, hygiene and grooming   ASSESSMENT/PLAN:  Annual physical exam - Plan: Vitamin B12, Lipid panel, CBC with Differential/Platelet, Comprehensive metabolic panel, TSH  Osteoporosis without current pathological fracture, unspecified osteoporosis type - refuses bisphosphonates.  Advised her to research Prolia. Risks of osteoporosis reviewed, encouraged treatment. Ca, D, wt-bearing exercise  Senile purpura (HCC) - Plan: CBC with  Differential/Platelet  Bilateral impacted cerumen - asymptomatic; recommended use of drops to soften, and return for lavage if any symptoms  History of duodenal ulcer - on PPI and has f/u EGD planned for February  Neuropathy - on bilateral feet, laterally. Only noted since her injury/fracture, not progressive or bothersome. consider neuro eval - Plan: Vitamin B12, CBC with Differential/Platelet, Comprehensive metabolic panel, TSH  Dysequilibrium - possibly related to allergies (not well controlled), or issues with her progressive glasses--plans to see ophtho.  Allergic rhinitis, unspecified seasonality, unspecified trigger - suboptimally controlled--reviewed proper technique, meds. May contribute to equilibrium issues  Pure hypercholesterolemia - due for recheck. cont low cholesterol diet - Plan: Lipid panel   Discussed monthly self breast exams and yearly mammograms; at least 30 minutes of aerobic activity at least 5 days/week, weight bearing exercise at least 2x/week; proper sunscreen use reviewed; healthy diet, including goals of calcium and vitamin D intake and  alcohol recommendations (less than or equal to 1 drink/day) reviewed; regular seatbelt use; changing batteries in smoke detectors. Immunization recommendations discussed. Continue yearly high dose flu shots.  Colon cancer screening reviewed, UTD. Cologuard in 11/2018, due again in 11/2021.  F/u for AWV

## 2020-11-25 ENCOUNTER — Ambulatory Visit (INDEPENDENT_AMBULATORY_CARE_PROVIDER_SITE_OTHER): Payer: Medicare Other | Admitting: Family Medicine

## 2020-11-25 ENCOUNTER — Other Ambulatory Visit: Payer: Self-pay

## 2020-11-25 ENCOUNTER — Encounter: Payer: Self-pay | Admitting: Family Medicine

## 2020-11-25 VITALS — BP 92/58 | HR 64 | Ht 61.0 in | Wt 98.2 lb

## 2020-11-25 DIAGNOSIS — D692 Other nonthrombocytopenic purpura: Secondary | ICD-10-CM | POA: Diagnosis not present

## 2020-11-25 DIAGNOSIS — G629 Polyneuropathy, unspecified: Secondary | ICD-10-CM

## 2020-11-25 DIAGNOSIS — M810 Age-related osteoporosis without current pathological fracture: Secondary | ICD-10-CM

## 2020-11-25 DIAGNOSIS — J309 Allergic rhinitis, unspecified: Secondary | ICD-10-CM

## 2020-11-25 DIAGNOSIS — Z0000 Encounter for general adult medical examination without abnormal findings: Secondary | ICD-10-CM | POA: Diagnosis not present

## 2020-11-25 DIAGNOSIS — E7800 Pure hypercholesterolemia, unspecified: Secondary | ICD-10-CM

## 2020-11-25 DIAGNOSIS — R42 Dizziness and giddiness: Secondary | ICD-10-CM

## 2020-11-25 DIAGNOSIS — H6123 Impacted cerumen, bilateral: Secondary | ICD-10-CM

## 2020-11-25 DIAGNOSIS — Z8719 Personal history of other diseases of the digestive system: Secondary | ICD-10-CM | POA: Diagnosis not present

## 2020-11-25 NOTE — Patient Instructions (Addendum)
HEALTH MAINTENANCE RECOMMENDATIONS:  It is recommended that you get at least 30 minutes of aerobic exercise at least 5 days/week (for weight loss, you may need as much as 60-90 minutes). This can be any activity that gets your heart rate up. This can be divided in 10-15 minute intervals if needed, but try and build up your endurance at least once a week.  Weight bearing exercise is also recommended twice weekly.  Eat a healthy diet with lots of vegetables, fruits and fiber.  "Colorful" foods have a lot of vitamins (ie green vegetables, tomatoes, red peppers, etc).  Limit sweet tea, regular sodas and alcoholic beverages, all of which has a lot of calories and sugar.  Up to 1 alcoholic drink daily may be beneficial for women (unless trying to lose weight, watch sugars).  Drink a lot of water.  Calcium recommendations are 1200-1500 mg daily (1500 mg for postmenopausal women or women without ovaries), and vitamin D 1000 IU daily.  This should be obtained from diet and/or supplements (vitamins), and calcium should not be taken all at once, but in divided doses.  Monthly self breast exams and yearly mammograms for women over the age of 4 is recommended.  Sunscreen of at least SPF 30 should be used on all sun-exposed parts of the skin when outside between the hours of 10 am and 4 pm (not just when at beach or pool, but even with exercise, golf, tennis, and yard work!)  Use a sunscreen that says "broad spectrum" so it covers both UVA and UVB rays, and make sure to reapply every 1-2 hours.  Remember to change the batteries in your smoke detectors when changing your clock times in the spring and fall. Carbon monoxide detectors are recommended for your home.  Use your seat belt every time you are in a car, and please drive safely and not be distracted with cell phones and texting while driving.  You may not need tylenol twice daily.  Use it only if/when having pain (may not need it during the day).  Your  have a lot of wax in your ears.  Don't use q-tips.  Consider using over-the-counter drops to soften the wax, and return for getting them cleaned if you're having trouble hearing.  Consider trying mucinex (guaifenesin) to loosen up the mucus, so it is less bothersome in your throat. Continue the flonase and zyrtec. Consider neti-pot to help loosen and rid of the mucus/phlegm.  I recommend you read about Prolia for treatment of your osteoporosis.   Osteoporosis  Osteoporosis is thinning and loss of density in your bones. Osteoporosis makes bones more brittle and fragile and more likely to break (fracture). Over time, osteoporosis can cause your bones to become so weak that they fracture after a minor fall. Bones in the hip, wrist, and spine are most likely to fracture due to osteoporosis. What are the causes? The exact cause of this condition is not known. What increases the risk? You may be at greater risk for osteoporosis if you:  Have a family history of the condition.  Have poor nutrition.  Use steroid medicines, such as prednisone.  Are female.  Are age 57 or older.  Smoke or have a history of smoking.  Are not physically active (are sedentary).  Are white (Caucasian) or of Asian descent.  Have a small body frame.  Take certain medicines, such as antiseizure medicines. What are the signs or symptoms? A fracture might be the first sign of osteoporosis, especially  if the fracture results from a fall or injury that usually would not cause a bone to break. Other signs and symptoms include:  Pain in the neck or low back.  Stooped posture.  Loss of height. How is this diagnosed? This condition may be diagnosed based on:  Your medical history.  A physical exam.  A bone mineral density test, also called a DXA or DEXA test (dual-energy X-ray absorptiometry test). This test uses X-rays to measure the amount of minerals in your bones. How is this treated? The goal of  treatment is to strengthen your bones and lower your risk for a fracture. Treatment may involve:  Making lifestyle changes, such as: ? Including foods with more calcium and vitamin D in your diet. ? Doing weight-bearing and muscle-strengthening exercises. ? Stopping tobacco use. ? Limiting alcohol intake.  Taking medicine to slow the process of bone loss or to increase bone density.  Taking daily supplements of calcium and vitamin D.  Taking hormone replacement medicines, such as estrogen for women and testosterone for men.  Monitoring your levels of calcium and vitamin D. Follow these instructions at home:  Activity  Exercise as told by your health care provider. Ask your health care provider what exercises and activities are safe for you. You should do: ? Exercises that make you work against gravity (weight-bearing exercises), such as tai chi, yoga, or walking. ? Exercises to strengthen muscles, such as lifting weights. Lifestyle  Limit alcohol intake to no more than 1 drink a day for nonpregnant women and 2 drinks a day for men. One drink equals 12 oz of beer, 5 oz of wine, or 1 oz of hard liquor.  Do not use any products that contain nicotine or tobacco, such as cigarettes and e-cigarettes. If you need help quitting, ask your health care provider. Preventing falls  Use devices to help you move around (mobility aids) as needed, such as canes, walkers, scooters, or crutches.  Keep rooms well-lit and clutter-free.  Remove tripping hazards from walkways, including cords and throw rugs.  Install grab bars in bathrooms and safety rails on stairs.  Use rubber mats in the bathroom and other areas that are often wet or slippery.  Wear closed-toe shoes that fit well and support your feet. Wear shoes that have rubber soles or low heels.  Review your medicines with your health care provider. Some medicines can cause dizziness or changes in blood pressure, which can increase your  risk of falling. General instructions  Include calcium and vitamin D in your diet. Calcium is important for bone health, and vitamin D helps your body to absorb calcium. Good sources of calcium and vitamin D include: ? Certain fatty fish, such as salmon and tuna. ? Products that have calcium and vitamin D added to them (fortified products), such as fortified cereals. ? Egg yolks. ? Cheese. ? Liver.  Take over-the-counter and prescription medicines only as told by your health care provider.  Keep all follow-up visits as told by your health care provider. This is important. Contact a health care provider if:  You have never been screened for osteoporosis and you are: ? A woman who is age 46 or older. ? A man who is age 74 or older. Get help right away if:  You fall or injure yourself. Summary  Osteoporosis is thinning and loss of density in your bones. This makes bones more brittle and fragile and more likely to break (fracture),even with minor falls.  The goal of  treatment is to strengthen your bones and reduce your risk for a fracture.  Include calcium and vitamin D in your diet. Calcium is important for bone health, and vitamin D helps your body to absorb calcium.  Talk with your health care provider about screening for osteoporosis if you are a woman who is age 28 or older, or a man who is age 31 or older. This information is not intended to replace advice given to you by your health care provider. Make sure you discuss any questions you have with your health care provider. Document Revised: 10/26/2017 Document Reviewed: 09/07/2017 Elsevier Patient Education  2020 [employer].

## 2020-12-08 ENCOUNTER — Other Ambulatory Visit: Payer: Self-pay

## 2020-12-08 ENCOUNTER — Other Ambulatory Visit: Payer: Medicare Other

## 2020-12-08 DIAGNOSIS — D692 Other nonthrombocytopenic purpura: Secondary | ICD-10-CM

## 2020-12-08 DIAGNOSIS — Z0000 Encounter for general adult medical examination without abnormal findings: Secondary | ICD-10-CM | POA: Diagnosis not present

## 2020-12-08 DIAGNOSIS — E7800 Pure hypercholesterolemia, unspecified: Secondary | ICD-10-CM | POA: Diagnosis not present

## 2020-12-08 DIAGNOSIS — G629 Polyneuropathy, unspecified: Secondary | ICD-10-CM | POA: Diagnosis not present

## 2020-12-09 LAB — CBC WITH DIFFERENTIAL/PLATELET
Basophils Absolute: 0.1 10*3/uL (ref 0.0–0.2)
Basos: 1 %
EOS (ABSOLUTE): 0.1 10*3/uL (ref 0.0–0.4)
Eos: 2 %
Hematocrit: 34 % (ref 34.0–46.6)
Hemoglobin: 11.6 g/dL (ref 11.1–15.9)
Immature Grans (Abs): 0 10*3/uL (ref 0.0–0.1)
Immature Granulocytes: 0 %
Lymphocytes Absolute: 2 10*3/uL (ref 0.7–3.1)
Lymphs: 38 %
MCH: 30.3 pg (ref 26.6–33.0)
MCHC: 34.1 g/dL (ref 31.5–35.7)
MCV: 89 fL (ref 79–97)
Monocytes Absolute: 0.6 10*3/uL (ref 0.1–0.9)
Monocytes: 11 %
Neutrophils Absolute: 2.6 10*3/uL (ref 1.4–7.0)
Neutrophils: 48 %
Platelets: 350 10*3/uL (ref 150–450)
RBC: 3.83 x10E6/uL (ref 3.77–5.28)
RDW: 12.6 % (ref 11.7–15.4)
WBC: 5.3 10*3/uL (ref 3.4–10.8)

## 2020-12-09 LAB — COMPREHENSIVE METABOLIC PANEL
ALT: 22 IU/L (ref 0–32)
AST: 23 IU/L (ref 0–40)
Albumin/Globulin Ratio: 1.6 (ref 1.2–2.2)
Albumin: 4.3 g/dL (ref 3.7–4.7)
Alkaline Phosphatase: 71 IU/L (ref 44–121)
BUN/Creatinine Ratio: 25 (ref 12–28)
BUN: 15 mg/dL (ref 8–27)
Bilirubin Total: 0.5 mg/dL (ref 0.0–1.2)
CO2: 23 mmol/L (ref 20–29)
Calcium: 9.7 mg/dL (ref 8.7–10.3)
Chloride: 95 mmol/L — ABNORMAL LOW (ref 96–106)
Creatinine, Ser: 0.6 mg/dL (ref 0.57–1.00)
GFR calc Af Amer: 104 mL/min/{1.73_m2} (ref >59)
GFR calc non Af Amer: 90 mL/min/{1.73_m2} (ref >59)
Globulin, Total: 2.7 g/dL (ref 1.5–4.5)
Glucose: 96 mg/dL (ref 65–99)
Potassium: 4.1 mmol/L (ref 3.5–5.2)
Sodium: 132 mmol/L — ABNORMAL LOW (ref 134–144)
Total Protein: 7 g/dL (ref 6.0–8.5)

## 2020-12-09 LAB — LIPID PANEL
Chol/HDL Ratio: 2.9 ratio (ref 0.0–4.4)
Cholesterol, Total: 228 mg/dL — ABNORMAL HIGH (ref 100–199)
HDL: 80 mg/dL (ref >39)
LDL Chol Calc (NIH): 142 mg/dL — ABNORMAL HIGH (ref 0–99)
Triglycerides: 38 mg/dL (ref 0–149)
VLDL Cholesterol Cal: 6 mg/dL (ref 5–40)

## 2020-12-09 LAB — VITAMIN B12: Vitamin B-12: 881 pg/mL (ref 232–1245)

## 2020-12-09 LAB — TSH: TSH: 0.818 u[IU]/mL (ref 0.450–4.500)

## 2020-12-14 ENCOUNTER — Other Ambulatory Visit: Payer: Self-pay | Admitting: Family Medicine

## 2020-12-15 NOTE — Telephone Encounter (Signed)
She is under care of GI (and scheduled for EGD in February). She should contact her GI for refills

## 2020-12-15 NOTE — Telephone Encounter (Signed)
This was in the refill requests-is this okay to refill? Maybe originally rx'd by ER doctor?

## 2020-12-15 NOTE — Telephone Encounter (Signed)
Patient advised, she will contact GI.

## 2020-12-22 ENCOUNTER — Other Ambulatory Visit: Payer: Self-pay

## 2020-12-22 ENCOUNTER — Encounter: Payer: Self-pay | Admitting: Family Medicine

## 2020-12-22 ENCOUNTER — Ambulatory Visit (INDEPENDENT_AMBULATORY_CARE_PROVIDER_SITE_OTHER): Payer: Medicare Other | Admitting: Family Medicine

## 2020-12-22 VITALS — BP 116/64 | HR 72 | Ht 61.0 in | Wt 99.4 lb

## 2020-12-22 DIAGNOSIS — R2689 Other abnormalities of gait and mobility: Secondary | ICD-10-CM

## 2020-12-22 DIAGNOSIS — R200 Anesthesia of skin: Secondary | ICD-10-CM

## 2020-12-22 NOTE — Progress Notes (Signed)
Chief Complaint  Patient presents with  . Balance issues    Ongoing balance issues, feels like she needs some "fine tuning." Would like a referral for more PT.     Complaining of "balance issues". She completed all of her home OT/PT since her injury. She feels like she needs more therapy.  Worries about her balance and falling. Home PT discharge had indicated that all goes were met. She denies any falls, just worries about it.  She has some numbness in her feet, feels like she might fall. Feels like her feet are asleep.  This isn't constant--wakes up with feet numb at night. Not much during the day. She feels a tightness in both legs, from the ankles up to the hips, L>R. Denies any pain.  L ankle sometimes is a little sore, feels better with walking.  She reports th numbness has been since her injury--slightly better.  She has been walking at the park (twice today with her dog). She is doing her home exercises regularly  She has some fatigue in back, if standing a lot in the kitchen Otherwise no back pain.  PMH, PSH, SH reviewed  Outpatient Encounter Medications as of 12/22/2020  Medication Sig Note  . Azelaic Acid (FINACEA EX) Apply topically.    Marland Kitchen b complex vitamins capsule Take 1 capsule by mouth daily.    . calcium carbonate 1250 MG capsule Take 1,250 mg by mouth daily.   . cholecalciferol (VITAMIN D) 1000 UNITS tablet Take 1,000 Units by mouth daily.    Marland Kitchen MAGNESIUM GLYCINATE PO Take 1 tablet by mouth in the morning, at noon, and at bedtime.   . Multiple Vitamins-Minerals (MULTIVITAMIN WITH MINERALS) tablet Take 1 tablet by mouth daily.    Marland Kitchen olopatadine (PATANOL) 0.1 % ophthalmic solution Place 1 drop into both eyes daily as needed for allergies.   . pantoprazole (PROTONIX) 40 MG tablet Take 1 tablet (40 mg total) by mouth 2 (two) times daily.   . vitamin E 1000 UNIT capsule Take 1,000 Units by mouth daily.    Marland Kitchen acetaminophen (TYLENOL) 500 MG tablet Take 500 mg by mouth every 6  (six) hours as needed. (Patient not taking: Reported on 12/22/2020) 11/25/2020: Takes BID (not prn, taking regularly)  . [DISCONTINUED] pantoprazole (PROTONIX) 20 MG tablet 1 tablet    No facility-administered encounter medications on file as of 12/22/2020.   Allergies  Allergen Reactions  . Tramadol Other (See Comments)    confusion  . Bactrim [Sulfamethoxazole-Trimethoprim] Hives   ROS: no fever, chills, URI symptoms. No headaches, dizziness.  Numbness in feet intermittently per HPI.  No burning pain.  No weakness.  No falls.  No vertigo.  No chest pain, shortness of breath. No edema. Moods are good. See HPI.   PHYSICAL EXAM:  BP 116/64   Pulse 72   Ht 5' 1" (1.549 m)   Wt 99 lb 6.4 oz (45.1 kg)   BMI 18.78 kg/m   Well-appearing, pleasant, thin female. She is slightly anxious/excitable, in no distress HEENT: conjunctiva and sclera are clear, EOMI, fundi benign Neck: No lymphadenopathy, thyromegaly, carotid bruit Heart: regular rate and rhythm Lungs: clear bilaterally Back: no spinal or CVA tenderness, no muscle espasm Abdomen: soft, nontender Extremities: normal pulses. FROM of hips, knees, ankles, without pain. Some discomfort with IT band stretch on the left. Neuro: alert and oriented. Cranial nerves intact. Normal strength, sensation, DTR's. Normal finger to nose, RAM. Psych: normal mood, affect, hygiene and grooming.  Slightly anxious.  ASSESSMENT/PLAN:  Balance problem - Pt feels like balance isn't good. She had entirely normal exam today. Reassured. Some tightness of IT band, shown stretches.  Numbness in feet - since injury, intermittent. Can eval further with labs at f/u. Poss contributing to balance issues. Improving per pt  I do not feel there is an indication at this time for ongoing PT.  She hasn't had falls. She has excellent strength, and a normal exam. I think she just lack self-confidence--tried to boost that today. The numbness in feet might  contribute--if not continuing to improve, may need to do further eval of this complaints when she returns next month.

## 2020-12-22 NOTE — Patient Instructions (Signed)
Your exam today was normal. The only thing that may be contributing to some of your concern with balance is the intermittent numbness in the feet. Be sure to move slow and move the feet around to get the feeling back before trying to walk.  You described some tightness in the hips, going down the legs.  That is related to the IT and.  Do the stretches shown today twice daily.  If you start falling, tripping, having new issues develop, please let us know.  Pay attention (and jot notes) to when the numbness in your feet happens (with shoes, with activity, while in bed, certain positions, etc), which part of the foot is affected, and what, if anything, makes it go away.

## 2020-12-23 ENCOUNTER — Encounter: Payer: Self-pay | Admitting: Family Medicine

## 2020-12-28 DIAGNOSIS — H524 Presbyopia: Secondary | ICD-10-CM | POA: Diagnosis not present

## 2021-01-05 DIAGNOSIS — Z01812 Encounter for preprocedural laboratory examination: Secondary | ICD-10-CM | POA: Diagnosis not present

## 2021-01-10 ENCOUNTER — Other Ambulatory Visit: Payer: Self-pay | Admitting: Gastroenterology

## 2021-01-10 DIAGNOSIS — K2930 Chronic superficial gastritis without bleeding: Secondary | ICD-10-CM | POA: Diagnosis not present

## 2021-01-10 DIAGNOSIS — K260 Acute duodenal ulcer with hemorrhage: Secondary | ICD-10-CM | POA: Diagnosis not present

## 2021-01-10 DIAGNOSIS — K3189 Other diseases of stomach and duodenum: Secondary | ICD-10-CM | POA: Diagnosis not present

## 2021-01-10 DIAGNOSIS — K269 Duodenal ulcer, unspecified as acute or chronic, without hemorrhage or perforation: Secondary | ICD-10-CM

## 2021-01-12 DIAGNOSIS — M79605 Pain in left leg: Secondary | ICD-10-CM | POA: Diagnosis not present

## 2021-01-12 DIAGNOSIS — M79672 Pain in left foot: Secondary | ICD-10-CM | POA: Diagnosis not present

## 2021-01-12 DIAGNOSIS — M79671 Pain in right foot: Secondary | ICD-10-CM | POA: Diagnosis not present

## 2021-01-12 DIAGNOSIS — M79604 Pain in right leg: Secondary | ICD-10-CM | POA: Diagnosis not present

## 2021-01-13 ENCOUNTER — Other Ambulatory Visit: Payer: Self-pay | Admitting: Gastroenterology

## 2021-01-13 ENCOUNTER — Ambulatory Visit
Admission: RE | Admit: 2021-01-13 | Discharge: 2021-01-13 | Disposition: A | Discharge status: Home / Self Care | Payer: Medicare Other | Source: Ambulatory Visit | Attending: Gastroenterology | Admitting: Gastroenterology

## 2021-01-13 DIAGNOSIS — K2930 Chronic superficial gastritis without bleeding: Secondary | ICD-10-CM | POA: Diagnosis not present

## 2021-01-13 DIAGNOSIS — K269 Duodenal ulcer, unspecified as acute or chronic, without hemorrhage or perforation: Secondary | ICD-10-CM

## 2021-01-13 DIAGNOSIS — K2100 Gastro-esophageal reflux disease with esophagitis, without bleeding: Secondary | ICD-10-CM | POA: Diagnosis not present

## 2021-01-13 DIAGNOSIS — K224 Dyskinesia of esophagus: Secondary | ICD-10-CM | POA: Diagnosis not present

## 2021-01-31 ENCOUNTER — Encounter: Payer: Self-pay | Admitting: Family Medicine

## 2021-02-02 DIAGNOSIS — M79604 Pain in right leg: Secondary | ICD-10-CM | POA: Diagnosis not present

## 2021-02-02 DIAGNOSIS — M79671 Pain in right foot: Secondary | ICD-10-CM | POA: Diagnosis not present

## 2021-02-02 DIAGNOSIS — M79605 Pain in left leg: Secondary | ICD-10-CM | POA: Diagnosis not present

## 2021-02-02 DIAGNOSIS — M79672 Pain in left foot: Secondary | ICD-10-CM | POA: Diagnosis not present

## 2021-02-16 DIAGNOSIS — M79671 Pain in right foot: Secondary | ICD-10-CM | POA: Diagnosis not present

## 2021-02-16 DIAGNOSIS — M79605 Pain in left leg: Secondary | ICD-10-CM | POA: Diagnosis not present

## 2021-02-16 DIAGNOSIS — M79604 Pain in right leg: Secondary | ICD-10-CM | POA: Diagnosis not present

## 2021-02-16 DIAGNOSIS — M79672 Pain in left foot: Secondary | ICD-10-CM | POA: Diagnosis not present

## 2021-02-16 NOTE — Progress Notes (Signed)
Chief Complaint  Patient presents with  . Medicare Wellness    Nonfasting AWV.  Still having some neuropathy and some vertigo.    Elizabeth Haynes is a 75 year old female who presents for Medicare Annual Wellness Visit. She doesn't have any concerns, though notes that she still has some neuropathy and balance issues (not vertigo).  She has been getting PT with Ronda Fairly, which has been helping both issues.  GI Bleed, GU and DU.  Admitted 07/2020, with coil embolization of duodenal ulcer. Patient underwent f/u EGD with Dr. Marca Ancona in 12/2020.  This showed post-ulcer scarring with deformity at the duodenum, unable to pass scope beyond that point. She also had UGI done 12/2020: IMPRESSION: 1. Mild irregularity of the superior margin of the duodenal bulb, compatible with mild scarring. No discrete residual duodenal ulcer. Embolization coils noted in the region of the duodenal bulb. 2. Spontaneous mild-to-moderate gastroesophageal reflux. No hiatal hernia. 3. Mild esophageal dysmotility, characteristic of chronic reflux related dysmotility. 4. Mild reflux esophagitis. No evidence of esophageal mass or stricture.  She was told to continue to take PPI once daily.  She denies dysphagia, chest pain.  Only very rare, mild heartburn.  Osteoporosis:  Previously took Fosamax for about 5 years (stopped around 2008). DEXA was 08/2014 and showed decline in hips, T-2.4.  She was put back on fosamax for a year after DEXA in 2017 by her GYN showed further decline in all areas, most notably in Spine, where T went from -2.4 to -3.2. She took herself off med, "I do not like that drug".  She had a reaction to oral surgery, had to take antibiotics, which caused GI issues; she thought fosamax also caused GI issues (diarrhea).   Her last DEXA was 09/2020. T-3.2 at spine, and -2.5 at L femoral neck. She is taking calcium and D supplements and using weights regularly. She had been asked at her physical to read about  Prolia, since she did not want further bisphosphonate treatment.  She presents today to further discuss treatment options for osteoporosis.   Immunization History  Administered Date(s) Administered  . Fluad Quad(high Dose 65+) 09/02/2020  . Influenza Split 08/11/2013, 08/16/2015  . Influenza, High Dose Seasonal PF 09/26/2018  . PFIZER(Purple Top)SARS-COV-2 Vaccination 02/10/2020, 03/02/2020, 09/17/2020  . Pneumococcal Conjugate-13 04/01/2015  . Pneumococcal Polysaccharide-23 07/15/2012  . Tdap 07/15/2012  . Zoster 11/10/2009  . Zoster Recombinat (Shingrix) 04/02/2017, 06/02/2017   Last Pap smear: here 03/2015, negative, with no high risk HPV. She sees Dr. Algie Coffer, (no pap done since then). Patient prefers not do get any more done. Last mammogram:  09/2020 Last colonoscopy: never. Had negative Cologuard in 11/2018 Last DEXA: 09/2020 T-3.2 spine, -2.5 L fem neck. Dentist: twice yearly Ophtho: yearly Exercise:walks the dog 45 minutes daily. Does weight routine every other day (3 and 5#). Still does home exercise routine for balance. She is seeing Ronda Fairly (PT) for balance exercises (3-4 sessions so far).  Vitamin D screen: 76.8 in 06/2018  She has healthcare power of attorney and Living Will. No copies received. Documents in chart reviewed-- DNR is scanned into Epic, from [employer]. Pt isn't sure that she has the hard copy at home. MOST form in chart is from 2019, says +CPR.  Needs to be updated today.  Fall screen: one in 06/2020, sacral fracture, minimally displaced Left L5 transverse fracture process. Depression screen: negative Functional status survey is notable for some affect on memory since her fall (can't recall things as quickly, eventually  remembers things). Mini-Cog screen: normal (5)  See full screens in Epic  Other doctors caring for patient include:  GYN: Dr. Ernestina Penna (no longer sees) Dentist: Dr. Elder Cyphers Ophtho: Dr. Manning Charity Derm: Dr. Venancio Poisson  (prn only) GI: Dr. Marca Ancona Deboraha Sprang) PT: Ronda Fairly  PMH, PSH, SH and FH were reviewed and updated.   Outpatient Encounter Medications as of 02/17/2021  Medication Sig Note  . acetaminophen (TYLENOL) 500 MG tablet Take 500 mg by mouth every 6 (six) hours as needed. 02/17/2021: Takes 1 before bedtime for back pain  . Azelaic Acid (FINACEA EX) Apply topically.    Marland Kitchen b complex vitamins capsule Take 1 capsule by mouth daily.    . calcium carbonate 1250 MG capsule Take 1,250 mg by mouth daily.   . cholecalciferol (VITAMIN D) 1000 UNITS tablet Take 1,000 Units by mouth daily.    Marland Kitchen MAGNESIUM GLYCINATE PO Take 1 tablet by mouth in the morning, at noon, and at bedtime.   . Multiple Vitamins-Minerals (MULTIVITAMIN WITH MINERALS) tablet Take 1 tablet by mouth daily.    Marland Kitchen olopatadine (PATANOL) 0.1 % ophthalmic solution Place 1 drop into both eyes daily as needed for allergies.   . pantoprazole (PROTONIX) 40 MG tablet Take 1 tablet (40 mg total) by mouth 2 (two) times daily. 02/17/2021: Taking once daily (changed by GI after EGD in 12/2020)  . vitamin E 1000 UNIT capsule Take 1,000 Units by mouth daily.    . [DISCONTINUED] pantoprazole (PROTONIX) 40 MG tablet Take 1 tablet by mouth 2 (two) times daily.    No facility-administered encounter medications on file as of 02/17/2021.   Allergies  Allergen Reactions  . Tramadol Other (See Comments)    confusion  . Bactrim [Sulfamethoxazole-Trimethoprim] Hives    ROS: The patient denies anorexia, fever, weight changes, headaches, vision changes, decreased hearing, ear pain, sore throat, breast concerns, chest pain, palpitations, dizziness, syncope, dyspnea on exertion, cough, swelling, nausea, vomiting, diarrhea, constipation, abdominal pain, melena, hematochezia, indigestion/heartburn, hematuria, incontinence, dysuria, vaginal bleeding, discharge, odor or itch, genital lesions, joint pains, numbness, tingling, weakness, tremor, suspicious skin lesions,  depression, anxiety, abnormal bleeding/bruising, or enlarged lymph nodes.  Sees dermatologist--no skin concerns currently.  Numbness/tingling on the outer part and toes of both feet (?since her fall), this as improved some.  Getting stimulation treatment at PT which helps.  Tingling fluctuates in severity some; not painful/bothersome.   PHYSICAL EXAM:  BP 90/60   Pulse 60   Ht 5\' 1"  (1.549 m)   Wt 102 lb 12.8 oz (46.6 kg)   BMI 19.42 kg/m   Wt Readings from Last 3 Encounters:  02/17/21 102 lb 12.8 oz (46.6 kg)  12/22/20 99 lb 6.4 oz (45.1 kg)  11/25/20 98 lb 3.2 oz (44.5 kg)   Well-appearing, thin, pleasant female in no distress. She is alert, oriented, with normal speech and eye contact. Normal mood, affect, hygiene and grooming. She has normal gait.   ASSESSMENT/PLAN:  Medicare annual wellness visit, subsequent  Osteoporosis without current pathological fracture, unspecified osteoporosis type - Encouraged treatment with either Prolia, or Evenity x 63yr f/b Prolia. Pt very reluctant. Cont Ca, D, wt-bearing exercise, though will not build bone.   Gastroesophageal reflux disease with esophagitis without hemorrhage  History of duodenal ulcer - s/p recent eval by GI with upper GI and EGD. Cont PPI 40mg  daily per GI.  Advance care planning - new DNR and new MOST forms filled out today, originals given to patient, copies taken to be scanned  into chart.  Counseled extensively re: osteoporosis, risks, and risks of treatments. Discussed Evenity and Prolia in detail (pt refuses bisphosphonates). Discussed that Ca/D/wt-bearing exercise has prevented further loss, but isn't building bones. Discussed risks of fractures, and change in quality of life, and how treating now can potentially prevent that, and maintain her excellent quality of life. She is very reluctant. She will read up on Evenity.  Discussed that Evenity alone is not a choice.  It would be a year of monthly injections, and  would need to be followed by Prolia (or bisphosphonate). If she decides she would like any treatment, she will let us know so that Olegario Messier can find out cost estimates for her.  She would not like this info yet.   Discussed monthly self breast exams and yearly mammograms; at least 30 minutes of aerobic activity at least 5 days/week, weight bearing exercise at least 2x/week; proper sunscreen use reviewed; healthy diet, including goals of calcium and vitamin D intake and alcohol recommendations (less than or equal to 1 drink/day) reviewed; regular seatbelt use; changing batteries in smoke detectors. Immunization recommendations discussed,continue yearly high dose flu shots. Pay attention to news for updated recommendations regarding COVID boosters. Colon cancer screening reviewed, UTD. Cologuard in 11/2018, due again in 11/2021.  DNR Prior MOST form was voided.  New form completed.  Fu as scheduled for CPE 11/2021   Medicare Attestation I have personally reviewed: The patient's medical and social history Their use of alcohol, tobacco or illicit drugs Their current medications and supplements The patient's functional ability including ADLs,fall risks, home safety risks, cognitive, and hearing and visual impairment Diet and physical activities Evidence for depression or mood disorders  The patient's weight, height and BMI have been recorded in the chart.  I have made referrals, counseling, and provided education to the patient based on review of the above and I have provided the patient with a written personalized care plan for preventive services.

## 2021-02-16 NOTE — Patient Instructions (Addendum)
Elizabeth Haynes , Thank you for taking time to come for your Medicare Wellness Visit. I appreciate your ongoing commitment to your health goals. Please review the following plan we discussed and let me know if I can assist you in the future.    This is a list of the screening recommended for you and due dates:  Health Maintenance  Topic Date Due  . Cologuard (Stool DNA test)  12/09/2021  . Tetanus Vaccine  07/15/2022  . Mammogram  10/13/2021  . Flu Shot  Completed  . DEXA scan (bone density measurement)  Completed  . COVID-19 Vaccine  Completed  .  Hepatitis C: One time screening is recommended by Center for Disease Control  (CDC) for  adults born from [birth year] through 1965.   Completed  . Pneumonia vaccines  Completed  . HPV Vaccine  Aged Out   Pay attention to the news regarding recommendations for further COVID vaccines.  Please get [place] copies of your notarized healthcare power of attorney and living will forms, so they can be scanned into your medical chart.  My recommendation is to treat your osteoporosis to prevent spinal and hip fractures (that is where your bones are weak). My recommendation is either Prolia every 6 months, OR best treatment would be to take Evenity injections once monthly for a year, and then switch to Prolia every 6 months.  These treatments (either both or just the Prolia) will strengthen your bone and decrease your risk for fracture.  You were very hesitant about any treatment.  I encourage you to re-consider.  If you are interested, let us know and Juliann Pulse can find out what your costs will be with your insurance.   Osteoporosis  Osteoporosis happens when the bones become thin and less dense than normal. Osteoporosis makes bones more brittle and fragile and more likely to break (fracture). Over time, osteoporosis can cause your bones to become so weak that they fracture after a minor fall. Bones in the hip, wrist, and spine are most likely to fracture due to  osteoporosis. What are the causes? The exact cause of this condition is not known. What increases the risk? You are more likely to develop this condition if you:  Have family members with this condition.  Have poor nutrition.  Use the following: ? Steroid medicines, such as prednisone. ? Anti-seizure medicines. ? Nicotine or tobacco, such as cigarettes, e-cigarettes, and chewing tobacco.  Are female.  Are age 27 or older.  Are not physically active (are sedentary).  Are of European or Asian descent.  Have a small body frame. What are the signs or symptoms? A fracture might be the first sign of osteoporosis, especially if the fracture results from a fall or injury that usually would not cause a bone to break. Other signs and symptoms include:  Pain in the neck or low back.  Stooped posture.  Loss of height. How is this diagnosed? This condition may be diagnosed based on:  Your medical history.  A physical exam.  A bone mineral density test, also called a DXA or DEXA test (dual-energy X-ray absorptiometry test). This test uses X-rays to measure the amount of minerals in your bones. How is this treated? This condition may be treated by:  Making lifestyle changes, such as: ? Including foods with more calcium and vitamin D in your diet. ? Doing weight-bearing and muscle-strengthening exercises. ? Stopping tobacco use. ? Limiting alcohol intake.  Taking medicine to slow the process of bone loss or  to increase bone density.  Taking daily supplements of calcium and vitamin D.  Taking hormone replacement medicines, such as estrogen for women and testosterone for men.  Monitoring your levels of calcium and vitamin D. The goal of treatment is to strengthen your bones and lower your risk for a fracture. Follow these instructions at home: Eating and drinking Include calcium and vitamin D in your diet. Calcium is important for bone health, and vitamin D helps your body  absorb calcium. Good sources of calcium and vitamin D include:  Certain fatty fish, such as salmon and tuna.  Products that have calcium and vitamin D added to them (are fortified), such as fortified cereals.  Egg yolks.  Cheese.  Liver.   Activity Do exercises as told by your health care provider. Ask your health care provider what exercises and activities are safe for you. You should do:  Exercises that make you work against gravity (weight-bearing exercises), such as tai chi, yoga, or walking.  Exercises to strengthen muscles, such as lifting weights. Lifestyle  Do not drink alcohol if: ? Your health care provider tells you not to drink. ? You are pregnant, may be pregnant, or are planning to become pregnant.  If you drink alcohol: ? Limit how much you use to:  0-1 drink a day for women.  0-2 drinks a day for men.  Know how much alcohol is in your drink. In the [place] one drink equals one 12 oz bottle of beer (355 mL), one 5 oz glass of wine (148 mL), or one 1 oz glass of hard liquor (44 mL).  Do not use any products that contain nicotine or tobacco, such as cigarettes, e-cigarettes, and chewing tobacco. If you need help quitting, ask your health care provider. Preventing falls  Use devices to help you move around (mobility aids) as needed, such as canes, walkers, scooters, or crutches.  Keep rooms well-lit and clutter-free.  Remove tripping hazards from walkways, including cords and throw rugs.  Install grab bars in bathrooms and safety rails on stairs.  Use rubber mats in the bathroom and other areas that are often wet or slippery.  Wear closed-toe shoes that fit well and support your feet. Wear shoes that have rubber soles or low heels.  Review your medicines with your health care provider. Some medicines can cause dizziness or changes in blood pressure, which can increase your risk of falling. General instructions  Take over-the-counter and prescription  medicines only as told by your health care provider.  Keep all follow-up visits. This is important. Contact a health care provider if:  You have never been screened for osteoporosis and you are: ? A woman who is age 70 or older. ? A man who is age 37 or older. Get help right away if:  You fall or injure yourself. Summary  Osteoporosis is thinning and loss of density in your bones. This makes bones more brittle and fragile and more likely to break (fracture),even with minor falls.  The goal of treatment is to strengthen your bones and lower your risk for a fracture.  Include calcium and vitamin D in your diet. Calcium is important for bone health, and vitamin D helps your body absorb calcium.  Talk with your health care provider about screening for osteoporosis if you are a woman who is age 46 or older, or a man who is age 74 or older. This information is not intended to replace advice given to you by your health care provider.  Make sure you discuss any questions you have with your health care provider. Document Revised: 04/29/2020 Document Reviewed: 04/29/2020 Elsevier Patient Education  [place].

## 2021-02-17 ENCOUNTER — Encounter: Payer: Self-pay | Admitting: Family Medicine

## 2021-02-17 ENCOUNTER — Ambulatory Visit (INDEPENDENT_AMBULATORY_CARE_PROVIDER_SITE_OTHER): Payer: Medicare Other | Admitting: Family Medicine

## 2021-02-17 ENCOUNTER — Other Ambulatory Visit: Payer: Self-pay

## 2021-02-17 VITALS — BP 90/60 | HR 60 | Ht 61.0 in | Wt 102.8 lb

## 2021-02-17 DIAGNOSIS — K2100 Gastro-esophageal reflux disease with esophagitis, without bleeding: Secondary | ICD-10-CM

## 2021-02-17 DIAGNOSIS — Z0000 Encounter for general adult medical examination without abnormal findings: Secondary | ICD-10-CM

## 2021-02-17 DIAGNOSIS — Z7189 Other specified counseling: Secondary | ICD-10-CM | POA: Diagnosis not present

## 2021-02-17 DIAGNOSIS — M810 Age-related osteoporosis without current pathological fracture: Secondary | ICD-10-CM | POA: Diagnosis not present

## 2021-02-17 DIAGNOSIS — Z8719 Personal history of other diseases of the digestive system: Secondary | ICD-10-CM | POA: Diagnosis not present

## 2021-02-21 DIAGNOSIS — M79672 Pain in left foot: Secondary | ICD-10-CM | POA: Diagnosis not present

## 2021-02-21 DIAGNOSIS — M79604 Pain in right leg: Secondary | ICD-10-CM | POA: Diagnosis not present

## 2021-02-21 DIAGNOSIS — M79671 Pain in right foot: Secondary | ICD-10-CM | POA: Diagnosis not present

## 2021-02-21 DIAGNOSIS — M79605 Pain in left leg: Secondary | ICD-10-CM | POA: Diagnosis not present

## 2021-03-02 DIAGNOSIS — M79672 Pain in left foot: Secondary | ICD-10-CM | POA: Diagnosis not present

## 2021-03-02 DIAGNOSIS — M79605 Pain in left leg: Secondary | ICD-10-CM | POA: Diagnosis not present

## 2021-03-02 DIAGNOSIS — M79671 Pain in right foot: Secondary | ICD-10-CM | POA: Diagnosis not present

## 2021-03-02 DIAGNOSIS — M79604 Pain in right leg: Secondary | ICD-10-CM | POA: Diagnosis not present

## 2021-03-16 DIAGNOSIS — M79672 Pain in left foot: Secondary | ICD-10-CM | POA: Diagnosis not present

## 2021-03-16 DIAGNOSIS — M79604 Pain in right leg: Secondary | ICD-10-CM | POA: Diagnosis not present

## 2021-03-16 DIAGNOSIS — M79605 Pain in left leg: Secondary | ICD-10-CM | POA: Diagnosis not present

## 2021-03-16 DIAGNOSIS — M79671 Pain in right foot: Secondary | ICD-10-CM | POA: Diagnosis not present

## 2021-04-06 DIAGNOSIS — M79605 Pain in left leg: Secondary | ICD-10-CM | POA: Diagnosis not present

## 2021-04-06 DIAGNOSIS — M79671 Pain in right foot: Secondary | ICD-10-CM | POA: Diagnosis not present

## 2021-04-06 DIAGNOSIS — M79672 Pain in left foot: Secondary | ICD-10-CM | POA: Diagnosis not present

## 2021-04-06 DIAGNOSIS — M79604 Pain in right leg: Secondary | ICD-10-CM | POA: Diagnosis not present

## 2021-04-21 DIAGNOSIS — M79671 Pain in right foot: Secondary | ICD-10-CM | POA: Diagnosis not present

## 2021-04-21 DIAGNOSIS — M79604 Pain in right leg: Secondary | ICD-10-CM | POA: Diagnosis not present

## 2021-04-21 DIAGNOSIS — M79672 Pain in left foot: Secondary | ICD-10-CM | POA: Diagnosis not present

## 2021-04-21 DIAGNOSIS — M79605 Pain in left leg: Secondary | ICD-10-CM | POA: Diagnosis not present

## 2021-05-04 DIAGNOSIS — M79671 Pain in right foot: Secondary | ICD-10-CM | POA: Diagnosis not present

## 2021-05-04 DIAGNOSIS — M79672 Pain in left foot: Secondary | ICD-10-CM | POA: Diagnosis not present

## 2021-05-04 DIAGNOSIS — M79605 Pain in left leg: Secondary | ICD-10-CM | POA: Diagnosis not present

## 2021-05-04 DIAGNOSIS — M79604 Pain in right leg: Secondary | ICD-10-CM | POA: Diagnosis not present

## 2021-06-08 DIAGNOSIS — M79672 Pain in left foot: Secondary | ICD-10-CM | POA: Diagnosis not present

## 2021-06-08 DIAGNOSIS — M79671 Pain in right foot: Secondary | ICD-10-CM | POA: Diagnosis not present

## 2021-06-08 DIAGNOSIS — M79604 Pain in right leg: Secondary | ICD-10-CM | POA: Diagnosis not present

## 2021-06-08 DIAGNOSIS — M79605 Pain in left leg: Secondary | ICD-10-CM | POA: Diagnosis not present

## 2021-06-20 DIAGNOSIS — M79605 Pain in left leg: Secondary | ICD-10-CM | POA: Diagnosis not present

## 2021-06-20 DIAGNOSIS — M79672 Pain in left foot: Secondary | ICD-10-CM | POA: Diagnosis not present

## 2021-06-20 DIAGNOSIS — M79671 Pain in right foot: Secondary | ICD-10-CM | POA: Diagnosis not present

## 2021-06-20 DIAGNOSIS — M79604 Pain in right leg: Secondary | ICD-10-CM | POA: Diagnosis not present

## 2021-07-04 DIAGNOSIS — M79672 Pain in left foot: Secondary | ICD-10-CM | POA: Diagnosis not present

## 2021-07-04 DIAGNOSIS — M79671 Pain in right foot: Secondary | ICD-10-CM | POA: Diagnosis not present

## 2021-07-04 DIAGNOSIS — M79604 Pain in right leg: Secondary | ICD-10-CM | POA: Diagnosis not present

## 2021-07-04 DIAGNOSIS — M79605 Pain in left leg: Secondary | ICD-10-CM | POA: Diagnosis not present

## 2021-07-18 DIAGNOSIS — M79671 Pain in right foot: Secondary | ICD-10-CM | POA: Diagnosis not present

## 2021-07-18 DIAGNOSIS — M79604 Pain in right leg: Secondary | ICD-10-CM | POA: Diagnosis not present

## 2021-07-18 DIAGNOSIS — M79605 Pain in left leg: Secondary | ICD-10-CM | POA: Diagnosis not present

## 2021-07-18 DIAGNOSIS — M79672 Pain in left foot: Secondary | ICD-10-CM | POA: Diagnosis not present

## 2021-07-19 ENCOUNTER — Other Ambulatory Visit: Payer: Self-pay

## 2021-07-19 ENCOUNTER — Telehealth: Payer: Self-pay

## 2021-07-19 DIAGNOSIS — R200 Anesthesia of skin: Secondary | ICD-10-CM

## 2021-07-19 NOTE — Telephone Encounter (Signed)
Pt. Called wanting to know if we could do a referral for her to [facility]. She has mentioned this issue to you in the past. She had numbness in her feet and ankles for almost a year now. She would like it to [facility], to see Dr. Jeannine Kitten.

## 2021-07-19 NOTE — Telephone Encounter (Signed)
Ok for referral?

## 2021-07-21 ENCOUNTER — Encounter: Payer: Self-pay | Admitting: Neurology

## 2021-07-21 ENCOUNTER — Ambulatory Visit: Payer: Medicare Other | Admitting: Neurology

## 2021-07-21 VITALS — BP 143/74 | HR 63 | Ht 61.0 in | Wt 105.0 lb

## 2021-07-21 DIAGNOSIS — R202 Paresthesia of skin: Secondary | ICD-10-CM | POA: Diagnosis not present

## 2021-07-21 NOTE — Progress Notes (Signed)
Chief Complaint  Patient presents with   New Patient (Initial Visit)    Rm 16, alone, internal referral, pt c/o  numbness in both feet, numbness is constant, states walking helps, reports numbness started 2 years ago when she fell, pt goes to PT every 2 weeks.      ASSESSMENT AND PLAN  Elizabeth Haynes is a 75 y.o. female   Bilateral feet paresthesia  Suggestive of length dependent peripheral neuropathy, differentiation diagnoses also include lumbosacral radiculopathy  EMG nerve conduction study      DIAGNOSTIC DATA (LABS, IMAGING, TESTING) - I reviewed patient records, labs, notes, testing and imaging myself where available.  Laboratory evaluation in January 2022 normal B12, CBC hemoglobin 11.6, anemia hemoglobin down to 6.29 July 2020, TSH, lipid panel LDL 142, cholesterol 228, CMP 0.48 CT head without contrast September 2021 Chronic atrophic and ischemic changes without acute abnormality.  CT pelvic on Sept 2022: 1.  Acute mildly displaced bilateral sacral fractures without definite involvement of the sacroiliac joints.  2.  Minimally displaced fracture of the left L5 transverse process.  3.  Small amount of pelvic ascites  MEDICAL HISTORY:  Elizabeth Haynes is a 74 year old female, seen in request by her primary care physician Dr. Phineas Semen, Eve she notes bilateral feet paresthesia, initial evaluation was on July 21, 2021    I reviewed and summarized the referring note. PMHX. History of duodenal ulcer with GI bleeding in 2021,  She fell backwards onto concrete floor from a low stepping stool on July 27, 2020, suffered mildly displaced bilateral sacral fracture without definite involvement of sacroiliac joint, minimally displaced fracture of left L5 transverse process, no surgery was performed, required prolonged rehabilitation,  She eventually able to walk without any assistant at the beginning of 2022, since the injury, she noticed bottom of bilateral feet  numb tingling, padded sensation when bearing weight, she denies significant gait abnormality, symptoms seems to protrude over the past few months, no change, no significant pain,  She continue intermittent low back pain, but denies radiating pain, urinary urgency, has occasionally neck pain, denies hands paresthesia     PHYSICAL EXAM:   Vitals:   07/21/21 1404  BP: (!) 143/74  Pulse: 63  Weight: 105 lb (47.6 kg)  Height: 5\' 1"  (1.549 m)   Not recorded     Body mass index is 19.84 kg/m.  PHYSICAL EXAMNIATION:  Gen: NAD, conversant, well nourised, well groomed                     Cardiovascular: Regular rate rhythm, no peripheral edema, warm, nontender. Eyes: Conjunctivae clear without exudates or hemorrhage Neck: Supple, no carotid bruits. Pulmonary: Clear to auscultation bilaterally   NEUROLOGICAL EXAM:  MENTAL STATUS: Speech:    Speech is normal; fluent and spontaneous with normal comprehension.  Cognition:     Orientation to time, place and person     Normal recent and remote memory     Normal Attention span and concentration     Normal Language, naming, repeating,spontaneous speech     Fund of knowledge   CRANIAL NERVES: CN II: Visual fields are full to confrontation. Pupils are round equal and briskly reactive to light. CN III, IV, VI: extraocular movement are normal. No ptosis. CN V: Facial sensation is intact to light touch CN VII: Face is symmetric with normal eye closure  CN VIII: Hearing is normal to causal conversation. CN IX, X: Phonation is normal. CN XI: Head turning  and shoulder shrug are intact  MOTOR: There is no pronator drift of out-stretched arms. Muscle bulk and tone are normal. Muscle strength is normal.  REFLEXES: Reflexes are 2+ and symmetric at the biceps, triceps, knees, and absent at ankles. Plantar responses are flexor.  SENSORY: Mildly length dependent decreased light touch pinprick to ankle level, decreased toe proprioception and  vibratory sensation  COORDINATION: There is no trunk or limb dysmetria noted.  GAIT/STANCE: She can get up from seated position arm crossed, mildly cautious, mild difficulty performing tiptoe walking, slight positive Romberg signs  REVIEW OF SYSTEMS:  Full 14 system review of systems performed and notable only for as above All other review of systems were negative.   ALLERGIES: Allergies  Allergen Reactions   Tramadol Other (See Comments)    confusion   Bactrim [Sulfamethoxazole-Trimethoprim] Hives    HOME MEDICATIONS: Current Outpatient Medications  Medication Sig Dispense Refill   acetaminophen (TYLENOL) 500 MG tablet Take 500 mg by mouth every 6 (six) hours as needed.     Azelaic Acid (FINACEA EX) Apply topically.      b complex vitamins capsule Take 1 capsule by mouth daily.      calcium carbonate 1250 MG capsule Take 1,250 mg by mouth daily.     cholecalciferol (VITAMIN D) 1000 UNITS tablet Take 1,000 Units by mouth daily.      MAGNESIUM GLYCINATE PO Take 1 tablet by mouth in the morning, at noon, and at bedtime.     Multiple Vitamins-Minerals (MULTIVITAMIN WITH MINERALS) tablet Take 1 tablet by mouth daily.      olopatadine (PATANOL) 0.1 % ophthalmic solution Place 1 drop into both eyes daily as needed for allergies.     pantoprazole (PROTONIX) 40 MG tablet Take 1 tablet (40 mg total) by mouth 2 (two) times daily. 60 tablet 3   vitamin E 1000 UNIT capsule Take 1,000 Units by mouth daily.      No current facility-administered medications for this visit.    PAST MEDICAL HISTORY: Past Medical History:  Diagnosis Date   Duodenal ulcer    GI bleed 2021   Osteopenia    Osteoporosis 09/2016   Rosacea    Symptomatic menopausal or female climacteric states     PAST SURGICAL HISTORY: Past Surgical History:  Procedure Laterality Date   BIOPSY  08/23/2020   Procedure: BIOPSY;  Surgeon: Kerin Salen, MD;  Location: [facility];  Service: Gastroenterology;;    ESOPHAGOGASTRODUODENOSCOPY (EGD) WITH PROPOFOL Left 08/23/2020   Procedure: ESOPHAGOGASTRODUODENOSCOPY (EGD) WITH PROPOFOL;  Surgeon: Kerin Salen, MD;  Location: [facility];  Service: Gastroenterology;  Laterality: Left;   IR ANGIOGRAM SELECTIVE EACH ADDITIONAL VESSEL  08/24/2020   IR ANGIOGRAM VISCERAL SELECTIVE  08/24/2020   IR ANGIOGRAM VISCERAL SELECTIVE  08/24/2020   IR EMBO ART  VEN HEMORR LYMPH EXTRAV  INC GUIDE ROADMAPPING  08/24/2020   IR US GUIDE VASC ACCESS RIGHT  08/24/2020   SCLEROTHERAPY  08/23/2020   Procedure: Susa Day;  Surgeon: Kerin Salen, MD;  Location: [facility];  Service: Gastroenterology;;   TONSILLECTOMY AND ADENOIDECTOMY  age 76   TUBAL LIGATION      FAMILY HISTORY: Family History  Problem Relation Age of Onset   Dementia Mother    Hip fracture Mother        x 2   Alzheimer's disease Mother    Hypertension Mother    Heart disease Mother        pacemaker   Cancer Father  thyroid,lung,prostate   Diabetes Other    Breast cancer Maternal Grandmother    Heart attack Maternal Grandfather     SOCIAL HISTORY: Social History   Socioeconomic History   Marital status: Single    Spouse name: Not on file   Number of children: Not on file   Years of education: Not on file   Highest education level: Not on file  Occupational History   Not on file  Tobacco Use   Smoking status: Never   Smokeless tobacco: Never  Vaping Use   Vaping Use: Never used  Substance and Sexual Activity   Alcohol use: No   Drug use: No   Sexual activity: Not Currently  Other Topics Concern   Not on file  Social History Narrative   Lives with dog.  2 stepchildren ([place] and [place]).  Widowed. 8 grandchildren, 13 great grandchildren   Social Determinants of Corporate investment banker Strain: Not on file  Food Insecurity: Not on file  Transportation Needs: Not on file  Physical Activity: Not on file  Stress: Not on file  Social Connections: Not on file   Intimate Partner Violence: Not on file      Levert Feinstein, M.D. Ph.D.  [facility] [address], [address] [place], [place] [address] Ph: [phone] Fax: [phone]  CC:  Joselyn Arrow, MD [address] [place],  [place] [address]  Joselyn Arrow, MD

## 2021-07-27 ENCOUNTER — Telehealth: Payer: Self-pay | Admitting: Neurology

## 2021-07-27 ENCOUNTER — Ambulatory Visit (INDEPENDENT_AMBULATORY_CARE_PROVIDER_SITE_OTHER): Payer: Medicare Other | Admitting: Neurology

## 2021-07-27 ENCOUNTER — Encounter: Payer: Self-pay | Admitting: Neurology

## 2021-07-27 DIAGNOSIS — R202 Paresthesia of skin: Secondary | ICD-10-CM

## 2021-07-27 DIAGNOSIS — M545 Low back pain, unspecified: Secondary | ICD-10-CM

## 2021-07-27 DIAGNOSIS — Z0289 Encounter for other administrative examinations: Secondary | ICD-10-CM

## 2021-07-27 NOTE — Procedures (Signed)
Full Name: Elizabeth Haynes Gender: Female MRN #: [id number] Date of Birth: 1946-02-18    Visit Date: 07/27/2021 07:53 Age: 75 Years Examining Physician: Levert Feinstein, MD  Referring Physician: Levert Feinstein, MD History: 75 years old female with history of lumbar radiculopathy, now presenting with bilateral feet paresthesia  Summary of the test, Nerve conduction study: Bilateral sural, superficial peroneal sensory responses were normal.  Bilateral peroneal to EDB and tibial motor responses were normal.  Electromyography: Selected needle examination of bilateral lower extremity muscles and bilateral lumbosacral paraspinal muscles were normal.   Conclusion: This is a normal study.  There is no electrodiagnostic evidence of large fiber peripheral neuropathy or bilateral lumbosacral radiculopathy.    ------------------------------- Levert Feinstein M.D. PhD  [facility] [address] [place], [place] [address] Tel: [phone] Fax: [phone]  Verbal informed consent was obtained from the patient, patient was informed of potential risk of procedure, including bruising, bleeding, hematoma formation, infection, muscle weakness, muscle pain, numbness, among others.        MNC    Nerve / Sites Muscle Latency Ref. Amplitude Ref. Rel Amp Segments Distance Velocity Ref. Area    ms ms mV mV %  cm m/s m/s mVms  R Peroneal - EDB     Ankle EDB 4.4 ?6.5 2.0 ?2.0 100 Ankle - EDB 9   6.0     Fib head EDB 9.8  1.7  84.4 Fib head - Ankle 24 44 ?44 5.6     Pop fossa EDB 12.1  1.3  76.4 Pop fossa - Fib head 10 44 ?44 3.4         Pop fossa - Ankle      L Peroneal - EDB     Ankle EDB 4.4 ?6.5 2.5 ?2.0 100 Ankle - EDB 9   8.1     Fib head EDB 9.8  2.6  107 Fib head - Ankle 24 44 ?44 8.2     Pop fossa EDB 12.1  2.5  95.3 Pop fossa - Fib head 10 44 ?44 8.1         Pop fossa - Ankle      R Tibial - AH     Ankle AH 3.5 ?5.8 6.9 ?4.0 100 Ankle - AH 9   14.5     Pop fossa AH  11.2  5.0  72.7 Pop fossa - Ankle 33 43 ?41 12.4  L Tibial - AH     Ankle AH 4.1 ?5.8 6.1 ?4.0 100 Ankle - AH 9   11.0     Pop fossa AH 11.7  5.6  91.5 Pop fossa - Ankle 33 44 ?41 10.2             SNC    Nerve / Sites Rec. Site Peak Lat Ref.  Amp Ref. Segments Distance    ms ms V V  cm  R Sural - Ankle (Calf)     Calf Ankle 3.6 ?4.4 8 ?6 Calf - Ankle 14  L Sural - Ankle (Calf)     Calf Ankle 3.5 ?4.4 8 ?6 Calf - Ankle 14  R Superficial peroneal - Ankle     Lat leg Ankle 3.2 ?4.4 7 ?6 Lat leg - Ankle 14  L Superficial peroneal - Ankle     Lat leg Ankle 3.1 ?4.4 6 ?6 Lat leg - Ankle 14             F  Wave  Nerve F Lat Ref.   ms ms  R Tibial - AH 49.2 ?56.0  L Tibial - AH 49.7 ?56.0         EMG Summary Table    Spontaneous MUAP Recruitment  Muscle IA Fib PSW Fasc Other Amp Dur. Poly Pattern  L. Tibialis anterior Normal None None None _______ Normal Normal Normal Normal  L. Tibialis posterior Normal None None None _______ Normal Normal Normal Normal  L. Peroneus longus Normal None None None _______ Normal Normal Normal Normal  L. Gastrocnemius (Medial head) Normal None None None _______ Normal Normal Normal Normal  L. Vastus lateralis Normal None None None _______ Normal Normal Normal Normal  R. Tibialis anterior Normal None None None _______ Normal Normal Normal Normal  R. Tibialis posterior Normal None None None _______ Normal Normal Normal Normal  R. Peroneus longus Normal None None None _______ Normal Normal Normal Normal  R. Vastus lateralis Normal None None None _______ Normal Normal Normal Normal  R. Gastrocnemius (Medial head) Normal None None None _______ Normal Normal Normal Normal  L. Abductor hallucis Normal None None None _______ Normal Normal Normal Reduced  R. Lumbar paraspinals (low) Normal None None None _______ Normal Normal Normal Normal  R. Lumbar paraspinals (mid) Normal None None None _______ Normal Normal Normal Normal  L. Lumbar paraspinals (low)  Normal None None None _______ Normal Normal Normal Normal  L. Lumbar paraspinals (mid) Normal None None None _______ Normal Normal Normal Normal

## 2021-07-27 NOTE — Telephone Encounter (Signed)
[employer] medicare order sent to GI. NPR they will reach out to the patient to schedule.  

## 2021-07-27 NOTE — Progress Notes (Signed)
No chief complaint on file.     ASSESSMENT AND PLAN  Elizabeth Haynes is a 75 y.o. female   Bilateral feet paresthesia Chronic low back pain  EMG nerve conduction study showed no significant abnormalities, no large fiber neuropathy.  Lab evaluation to rule out cause of small fiber neuropathy  MRI lumbar     DIAGNOSTIC DATA (LABS, IMAGING, TESTING) - I reviewed patient records, labs, notes, testing and imaging myself where available.  Laboratory evaluation in January 2022 normal B12, CBC hemoglobin 11.6, anemia hemoglobin down to 6.29 July 2020, TSH, lipid panel LDL 142, cholesterol 228, CMP 0.48 CT head without contrast September 2021 Chronic atrophic and ischemic changes without acute abnormality.  CT pelvic on Sept 2022: 1.  Acute mildly displaced bilateral sacral fractures without definite involvement of the sacroiliac joints.  2.  Minimally displaced fracture of the left L5 transverse process.  3.  Small amount of pelvic ascites  MEDICAL HISTORY:  Elizabeth Haynes is a 75 year old female, seen in request by her primary care physician Dr. Phineas Semen, Eve she notes bilateral feet paresthesia, initial evaluation was on July 21, 2021    I reviewed and summarized the referring note. PMHX. History of duodenal ulcer with GI bleeding in 2021,  She fell backwards onto concrete floor from a low stepping stool on July 27, 2020, suffered mildly displaced bilateral sacral fracture without definite involvement of sacroiliac joint, minimally displaced fracture of left L5 transverse process, no surgery was performed, required prolonged rehabilitation,  She eventually able to walk without any assistant at the beginning of 2022, since the injury, she noticed bottom of bilateral feet numb tingling, padded sensation when bearing weight, she denies significant gait abnormality, symptoms seems to plateau over the past few months, no change, no significant pain,  She continue  intermittent low back pain, but denies radiating pain, urinary urgency, has occasionally neck pain, denies hands paresthesia  UPDATE July 27 2021: She has worsening low back pain, had history of radiating pain to lower extremity in the past, she continues to have bilateral feet paresthesia,   PHYSICAL EXAM:  Gen: NAD, conversant, well nourised, well groomed    NEUROLOGICAL EXAM:  MENTAL STATUS: Speech/Cognition: Awake, alert, oriented to history taking and causal conversation   CRANIAL NERVES: CN II: Visual fields are full to confrontation. Pupils are round equal and briskly reactive to light. CN III, IV, VI: extraocular movement are normal. No ptosis. CN V: Facial sensation is intact to light touch CN VII: Face is symmetric with normal eye closure  CN VIII: Hearing is normal to causal conversation. CN IX, X: Phonation is normal. CN XI: Head turning and shoulder shrug are intact  MOTOR: There is no pronator drift of out-stretched arms. Muscle bulk and tone are normal. Muscle strength is normal.  REFLEXES: Reflexes are 2+ and symmetric at the biceps, triceps, knees, and absent at absent ankles. Plantar responses are flexor.  SENSORY: Mildly length dependent decreased light touch pinprick to ankle level, decreased toe proprioception and vibratory sensation  COORDINATION: There is no trunk or limb dysmetria noted.  GAIT/STANCE: She can get up from seated position arm crossed, mildly cautious, mild difficulty performing tiptoe walking, slight negative Romberg signs  REVIEW OF SYSTEMS:  Full 14 system review of systems performed and notable only for as above All other review of systems were negative.   ALLERGIES: Allergies  Allergen Reactions   Tramadol Other (See Comments)    confusion   Bactrim [Sulfamethoxazole-Trimethoprim] Hives  HOME MEDICATIONS: Current Outpatient Medications  Medication Sig Dispense Refill   acetaminophen (TYLENOL) 500 MG tablet Take 500  mg by mouth every 6 (six) hours as needed.     Azelaic Acid (FINACEA EX) Apply topically.      b complex vitamins capsule Take 1 capsule by mouth daily.      calcium carbonate 1250 MG capsule Take 1,250 mg by mouth daily.     cholecalciferol (VITAMIN D) 1000 UNITS tablet Take 1,000 Units by mouth daily.      MAGNESIUM GLYCINATE PO Take 1 tablet by mouth in the morning, at noon, and at bedtime.     Multiple Vitamins-Minerals (MULTIVITAMIN WITH MINERALS) tablet Take 1 tablet by mouth daily.      olopatadine (PATANOL) 0.1 % ophthalmic solution Place 1 drop into both eyes daily as needed for allergies.     pantoprazole (PROTONIX) 40 MG tablet Take 1 tablet (40 mg total) by mouth 2 (two) times daily. 60 tablet 3   vitamin E 1000 UNIT capsule Take 1,000 Units by mouth daily.      No current facility-administered medications for this visit.    PAST MEDICAL HISTORY: Past Medical History:  Diagnosis Date   Duodenal ulcer    GI bleed 2021   Osteopenia    Osteoporosis 09/2016   Rosacea    Symptomatic menopausal or female climacteric states     PAST SURGICAL HISTORY: Past Surgical History:  Procedure Laterality Date   BIOPSY  08/23/2020   Procedure: BIOPSY;  Surgeon: Kerin Salen, MD;  Location: [facility];  Service: Gastroenterology;;   ESOPHAGOGASTRODUODENOSCOPY (EGD) WITH PROPOFOL Left 08/23/2020   Procedure: ESOPHAGOGASTRODUODENOSCOPY (EGD) WITH PROPOFOL;  Surgeon: Kerin Salen, MD;  Location: [facility];  Service: Gastroenterology;  Laterality: Left;   IR ANGIOGRAM SELECTIVE EACH ADDITIONAL VESSEL  08/24/2020   IR ANGIOGRAM VISCERAL SELECTIVE  08/24/2020   IR ANGIOGRAM VISCERAL SELECTIVE  08/24/2020   IR EMBO ART  VEN HEMORR LYMPH EXTRAV  INC GUIDE ROADMAPPING  08/24/2020   IR US GUIDE VASC ACCESS RIGHT  08/24/2020   SCLEROTHERAPY  08/23/2020   Procedure: Susa Day;  Surgeon: Kerin Salen, MD;  Location: [facility];  Service: Gastroenterology;;   TONSILLECTOMY AND ADENOIDECTOMY  age 25    TUBAL LIGATION      FAMILY HISTORY: Family History  Problem Relation Age of Onset   Dementia Mother    Hip fracture Mother        x 2   Alzheimer's disease Mother    Hypertension Mother    Heart disease Mother        pacemaker   Cancer Father        thyroid,lung,prostate   Diabetes Other    Breast cancer Maternal Grandmother    Heart attack Maternal Grandfather     SOCIAL HISTORY: Social History   Socioeconomic History   Marital status: Single    Spouse name: Not on file   Number of children: Not on file   Years of education: Not on file   Highest education level: Not on file  Occupational History   Not on file  Tobacco Use   Smoking status: Never   Smokeless tobacco: Never  Vaping Use   Vaping Use: Never used  Substance and Sexual Activity   Alcohol use: No   Drug use: No   Sexual activity: Not Currently  Other Topics Concern   Not on file  Social History Narrative   Lives with dog.  2 stepchildren ([place] and [place]).  Widowed. 8 grandchildren, 13 great grandchildren   Social Determinants of Corporate investment banker Strain: Not on file  Food Insecurity: Not on file  Transportation Needs: Not on file  Physical Activity: Not on file  Stress: Not on file  Social Connections: Not on file  Intimate Partner Violence: Not on file      Levert Feinstein, M.D. Ph.D.  [facility] [address] [place], [place] [address] Ph: [phone] Fax: [phone]  CC:  Joselyn Arrow, MD [address] [place],  [place] [address]  Joselyn Arrow, MD

## 2021-07-29 LAB — C-REACTIVE PROTEIN: CRP: <1 mg/L (ref 0–10)

## 2021-07-29 LAB — MULTIPLE MYELOMA PANEL, SERUM
Albumin SerPl Elph-Mcnc: 4.4 g/dL (ref 2.9–4.4)
Albumin/Glob SerPl: 1.3 (ref 0.7–1.7)
Alpha 1: 0.3 g/dL (ref 0.0–0.4)
Alpha2 Glob SerPl Elph-Mcnc: 0.9 g/dL (ref 0.4–1.0)
B-Globulin SerPl Elph-Mcnc: 1.1 g/dL (ref 0.7–1.3)
Gamma Glob SerPl Elph-Mcnc: 1.4 g/dL (ref 0.4–1.8)
Globulin, Total: 3.6 g/dL (ref 2.2–3.9)
IgA/Immunoglobulin A, Serum: 197 mg/dL (ref 64–422)
IgG (Immunoglobin G), Serum: 1329 mg/dL (ref 586–1602)
IgM (Immunoglobulin M), Srm: 289 mg/dL — ABNORMAL HIGH (ref 26–217)
Total Protein: 8 g/dL (ref 6.0–8.5)

## 2021-07-29 LAB — ANA W/REFLEX IF POSITIVE
Anti JO-1: <0.2 AI (ref 0.0–0.9)
Anti Nuclear Antibody (ANA): POSITIVE — AB
Centromere Ab Screen: <0.2 AI (ref 0.0–0.9)
Chromatin Ab SerPl-aCnc: <0.2 AI (ref 0.0–0.9)
ENA RNP Ab: 1.1 AI — ABNORMAL HIGH (ref 0.0–0.9)
ENA SM Ab Ser-aCnc: <0.2 AI (ref 0.0–0.9)
ENA SSA (RO) Ab: <0.2 AI (ref 0.0–0.9)
ENA SSB (LA) Ab: <0.2 AI (ref 0.0–0.9)
Scleroderma (Scl-70) (ENA) Antibody, IgG: <0.2 AI (ref 0.0–0.9)
dsDNA Ab: <1 IU/mL (ref 0–9)

## 2021-07-29 LAB — CK: Total CK: 216 U/L — ABNORMAL HIGH (ref 32–182)

## 2021-07-29 LAB — SEDIMENTATION RATE: Sed Rate: 27 mm/hr (ref 0–40)

## 2021-07-29 LAB — HGB A1C W/O EAG: Hgb A1c MFr Bld: 5.9 % — ABNORMAL HIGH (ref 4.8–5.6)

## 2021-07-29 LAB — RPR: RPR Ser Ql: NONREACTIVE

## 2021-08-02 ENCOUNTER — Telehealth: Payer: Self-pay | Admitting: Neurology

## 2021-08-02 DIAGNOSIS — M79604 Pain in right leg: Secondary | ICD-10-CM | POA: Diagnosis not present

## 2021-08-02 DIAGNOSIS — M79671 Pain in right foot: Secondary | ICD-10-CM | POA: Diagnosis not present

## 2021-08-02 DIAGNOSIS — M79672 Pain in left foot: Secondary | ICD-10-CM | POA: Diagnosis not present

## 2021-08-02 DIAGNOSIS — M79605 Pain in left leg: Secondary | ICD-10-CM | POA: Diagnosis not present

## 2021-08-02 NOTE — Telephone Encounter (Signed)
Left message for a return call

## 2021-08-02 NOTE — Telephone Encounter (Signed)
I spoke to the patient. She verbalized understanding of the findings. She will watch her diet and continue exercising (walks about 3 miles each day now).

## 2021-08-02 NOTE — Telephone Encounter (Signed)
Pt returned phone call, would like a call back.  

## 2021-08-02 NOTE — Telephone Encounter (Signed)
Please call patient,  Laboratory evaluation showed mild elevated A1c 5.9, indicating mildly elevated glucose level over the past few months, she should start by diet control and exercise  Positive ANA, and mild elevated CPK, unknown clinical significance,  Rest of the laboratory evaluation showed no significant abnormalities.

## 2021-08-03 IMAGING — US IR ANGIO/VISCERAL SELECTIVE EA VESSEL WO/W FLUSH
1 series · 1 of 1 positions shown · non-contrast
Comparison: none

INDICATION: 73-year-old female with significant upper GI bleeding from a large
duodenal ulcer with visible vessel in the ulcer bed. She continued
to have bleeding overnight with decreased hemoglobin this morning.
She presents for visceral selective ultrasound and embolization.

[Series 1: (id) · 1 of 1 slices shown]
[im 1/1]
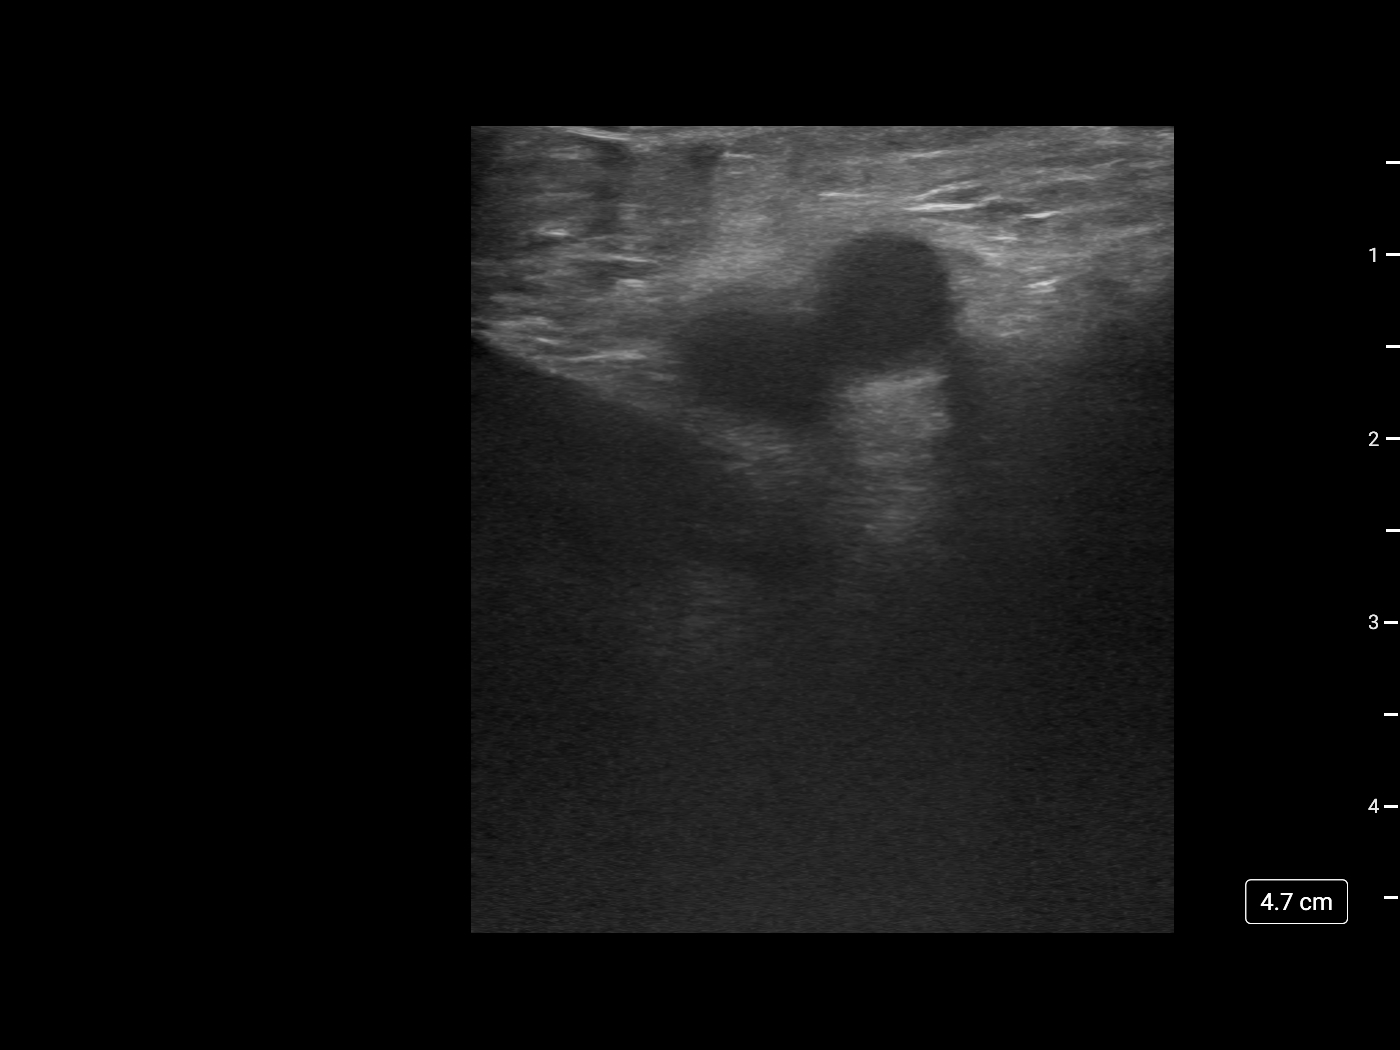

[1 of 1 positions shown; findings below may reference images not displayed]

EXAM:
SELECTIVE VISCERAL ARTERIOGRAPHY; IR ULTRASOUND GUIDANCE VASC ACCESS
RIGHT; IR MAHMED AHMED HAYTHAM HEMORR LYMPH EXTRAV INC GUIDE ROADMAPPING;
ADDITIONAL ARTERIOGRAPHY

MEDICATIONS:
None

ANESTHESIA/SEDATION:
Moderate (conscious) sedation was employed during this procedure. A
total of Versed 2 mg and Fentanyl 45 mcg was administered
intravenously.

Moderate Sedation Time: 40 minutes. The patient's level of
consciousness and vital signs were monitored continuously by
radiology nursing throughout the procedure under my direct
supervision.

CONTRAST:  45mL OMNIPAQUE IOHEXOL 300 MG/ML  SOLN

FLUOROSCOPY TIME:  Fluoroscopy Time: 10 minutes 54 seconds (240
mGy).

COMPLICATIONS:
None immediate.



The right common femoral artery was interrogated with ultrasound and
found to be widely patent. An image was obtained and stored for the
medical record. Local anesthesia was attained by infiltration with
1% lidocaine. A small dermatotomy was made. Under real-time
sonographic guidance, the vessel was punctured with a 21 gauge
micropuncture needle. Using standard technique, the initial micro
needle was exchanged over a 0.018 micro wire for a transitional 4
French micro sheath. The micro sheath was then exchanged over a
0.035 wire for a 5 French vascular sheath.

A C2 cobra catheter was advanced over a Bentson wire into the
abdominal aorta. The catheter was used to select the superior
mesenteric artery. A superior mesenteric arteriogram was performed.
No evidence of replaced right hepatic or enlarged
pancreaticoduodenal arteries.

Next, the C2 catheter was used to select the celiac axis. A celiac
arteriogram was performed. Conventional hepatic arterial anatomy.
There is a focal region of narrowing in the mid aspect of the
gastroduodenal artery concerning for a site of vascular injury.

Due to the down sloping configuration of the celiac axis, the C2
cobra catheter was exchanged for a Sos Omni selective catheter which
was successfully engaged into the celiac artery. A renegade STC
microcatheter was then advanced over a Fathom 16 wire into the
gastroduodenal artery. Arteriography was performed. This confirms a
focal narrowing in the mid gastroduodenal artery. Coil embolization
was then performed using a combination of interlock and Ruby
detachable microcoils. The mid and distal gastroduodenal artery were
successfully coiled to stasis. Contrast injection into the
gastroduodenal stump demonstrates reflux back into the hepatic
artery. There is no forward flow. The catheter system was removed.
Hemostasis was attained using a Cordis extra arterial vascular plug.
IMPRESSION: 1. Focal region of narrowing in the mid aspect of the gastroduodenal
artery concerning for region of spasm secondary to irritation from
the overlying ulcer bed. This may represent the source of
intermittent bleeding.
2. Successful coil embolization of the gastroduodenal artery.

## 2021-08-07 ENCOUNTER — Other Ambulatory Visit: Payer: Self-pay

## 2021-08-07 ENCOUNTER — Ambulatory Visit
Admission: RE | Admit: 2021-08-07 | Discharge: 2021-08-07 | Disposition: A | Discharge status: Home / Self Care | Payer: Medicare Other | Source: Ambulatory Visit | Attending: Neurology | Admitting: Neurology

## 2021-08-07 DIAGNOSIS — M545 Low back pain, unspecified: Secondary | ICD-10-CM | POA: Diagnosis not present

## 2021-08-07 DIAGNOSIS — R202 Paresthesia of skin: Secondary | ICD-10-CM

## 2021-10-05 DIAGNOSIS — L218 Other seborrheic dermatitis: Secondary | ICD-10-CM | POA: Diagnosis not present

## 2021-10-05 DIAGNOSIS — L718 Other rosacea: Secondary | ICD-10-CM | POA: Diagnosis not present

## 2021-10-19 DIAGNOSIS — Z1231 Encounter for screening mammogram for malignant neoplasm of breast: Secondary | ICD-10-CM | POA: Diagnosis not present

## 2021-10-19 LAB — HM MAMMOGRAPHY

## 2021-10-26 ENCOUNTER — Encounter: Payer: Self-pay | Admitting: *Deleted

## 2021-11-08 ENCOUNTER — Ambulatory Visit: Payer: Medicare Other | Admitting: Neurology

## 2021-11-08 ENCOUNTER — Encounter: Payer: Self-pay | Admitting: Neurology

## 2021-11-08 VITALS — BP 116/68 | HR 64 | Ht 61.0 in | Wt 110.5 lb

## 2021-11-08 DIAGNOSIS — R202 Paresthesia of skin: Secondary | ICD-10-CM | POA: Diagnosis not present

## 2021-11-08 NOTE — Progress Notes (Signed)
Chief Complaint  Patient presents with   Follow-up    Rm 15. Alone. PCP is Dr. Rita Ohara. Pt states paraesthesia symptoms are somewhat better. Pt is concerned about possible nerve damage.       ASSESSMENT AND PLAN  Elizabeth Haynes is a 75 y.o. female   Intermittent bilateral feet numbness Chronic low back pain, neck stiffness  EMG nerve conduction study showed no significant abnormalities, no large fiber neuropathy.  Lab evaluation showed slight elevated A1c of 5.9, otherwise no significant abnormalities.  MRI of the lumbar spine showed no significant canal or foraminal stenosis only slight degenerative changes  She was noted to have brisk bilateral lower extremity reflex, bilateral Babinski signs,  I have discussed with patient, if she desires further evaluation, may consider MRI of cervical spine, she is highly functional, will hold off further evaluation at this point,    DIAGNOSTIC DATA (LABS, IMAGING, TESTING) - I reviewed patient records, labs, notes, testing and imaging myself where available.  Laboratory evaluation in January 2022 normal B12, CBC hemoglobin 11.6, anemia hemoglobin down to 6.29 July 2020, TSH, lipid panel LDL 142, cholesterol 228, CMP 0.48 CT head without contrast September 2021 Chronic atrophic and ischemic changes without acute abnormality.  CT pelvic on Sept 2022: 1.  Acute mildly displaced bilateral sacral fractures without definite involvement of the sacroiliac joints.  2.  Minimally displaced fracture of the left L5 transverse process.  3.  Small amount of pelvic ascites  MEDICAL HISTORY:  Elizabeth Haynes is a 75 year old female, seen in request by her primary care physician Dr. Tyna Jaksch, Eve she notes bilateral feet paresthesia, initial evaluation was on July 21, 2021  I reviewed and summarized the referring note. PMHX. History of duodenal ulcer with GI bleeding in 2021,  She fell backwards onto concrete floor from a low stepping  stool on July 27, 2020, suffered mildly displaced bilateral sacral fracture without definite involvement of sacroiliac joint, minimally displaced fracture of left L5 transverse process, no surgery was performed, required prolonged rehabilitation,  She eventually able to walk without any assistant at the beginning of 2022, since the injury, she noticed bottom of bilateral feet numb tingling, padded sensation when bearing weight, she denies significant gait abnormality, symptoms seems to plateau over the past few months, no change, no significant pain,  She continue intermittent low back pain, but denies radiating pain, urinary urgency, has occasionally neck pain, denies hands paresthesia  UPDATE July 27 2021: She has worsening low back pain, had history of radiating pain to lower extremity in the past, she continues to have bilateral feet paresthesia,   UPDATE Nov 08 2021: EMG nerve conduction study on July 27, 2021 showed normal large fiber peripheral neuropathy or bilateral lumbosacral radiculopathy  We personally reviewed MRI of lumbar spine in September 2022, no significant canal foraminal narrowing, only slight degenerative changes  Laboratory evaluation showed slight elevated A1c 5.9, slight elevated CPK 216, positive ANA, otherwise no significant abnormality, normal negative CMP, CBC, protein electrophoresis, TSH, ESR C-reactive protein RPR  She continue to be active, walking her dogs few miles each day without any difficulty, can combine up and down steps without any difficulty  She does complains of neck stiffness, urinary urgency, frequency,  PHYSICAL EXAM:  Gen: NAD, conversant, well nourised, well groomed    NEUROLOGICAL EXAM:  MENTAL STATUS: Speech/Cognition: Awake, alert, oriented to history taking and causal conversation   CRANIAL NERVES: CN II: Visual fields are full to confrontation. Pupils are  round equal and briskly reactive to light. CN III, IV, VI:  extraocular movement are normal. No ptosis. CN V: Facial sensation is intact to light touch CN VII: Face is symmetric with normal eye closure  CN VIII: Hearing is normal to causal conversation. CN IX, X: Phonation is normal. CN XI: Head turning and shoulder shrug are intact  MOTOR: There is no pronator drift of out-stretched arms. Muscle bulk and tone are normal. Muscle strength is normal.  REFLEXES: Reflexes are 2+ and symmetric at the biceps, triceps, knees, and absent at absent ankles. Plantar responses are extensor bilaterally  SENSORY: Mildly length dependent decreased light touch pinprick to ankle level, decreased toe proprioception and vibratory sensation  COORDINATION: There is no trunk or limb dysmetria noted.  GAIT/STANCE: She can get up from seated position arm crossed, mildly cautious, mild difficulty performing tiptoe walking, negative Romberg signs REVIEW OF SYSTEMS:  Full 14 system review of systems performed and notable only for as above All other review of systems were negative.   ALLERGIES: Allergies  Allergen Reactions   Tramadol Other (See Comments)    confusion   Bactrim [Sulfamethoxazole-Trimethoprim] Hives    HOME MEDICATIONS: Current Outpatient Medications  Medication Sig Dispense Refill   acetaminophen (TYLENOL) 500 MG tablet Take 500 mg by mouth every 6 (six) hours as needed.     Azelaic Acid (FINACEA EX) Apply topically.      b complex vitamins capsule Take 1 capsule by mouth daily.      calcium carbonate 1250 MG capsule Take 1,250 mg by mouth daily.     cholecalciferol (VITAMIN D) 1000 UNITS tablet Take 1,000 Units by mouth daily.      MAGNESIUM GLYCINATE PO Take 1 tablet by mouth in the morning, at noon, and at bedtime.     Multiple Vitamins-Minerals (MULTIVITAMIN WITH MINERALS) tablet Take 1 tablet by mouth daily.      olopatadine (PATANOL) 0.1 % ophthalmic solution Place 1 drop into both eyes daily as needed for allergies.     vitamin E 1000  UNIT capsule Take 1,000 Units by mouth daily.      pantoprazole (PROTONIX) 40 MG tablet Take 1 tablet (40 mg total) by mouth 2 (two) times daily. 60 tablet 3   No current facility-administered medications for this visit.    PAST MEDICAL HISTORY: Past Medical History:  Diagnosis Date   Duodenal ulcer    GI bleed 2021   Osteopenia    Osteoporosis 09/2016   Rosacea    Symptomatic menopausal or female climacteric states     PAST SURGICAL HISTORY: Past Surgical History:  Procedure Laterality Date   BIOPSY  08/23/2020   Procedure: BIOPSY;  Surgeon: Ronnette Juniper, MD;  Location: [facility];  Service: Gastroenterology;;   ESOPHAGOGASTRODUODENOSCOPY (EGD) WITH PROPOFOL Left 08/23/2020   Procedure: ESOPHAGOGASTRODUODENOSCOPY (EGD) WITH PROPOFOL;  Surgeon: Ronnette Juniper, MD;  Location: [facility];  Service: Gastroenterology;  Laterality: Left;   IR ANGIOGRAM SELECTIVE EACH ADDITIONAL VESSEL  08/24/2020   IR ANGIOGRAM VISCERAL SELECTIVE  08/24/2020   IR ANGIOGRAM VISCERAL SELECTIVE  08/24/2020   IR EMBO ART  VEN HEMORR LYMPH EXTRAV  INC GUIDE ROADMAPPING  08/24/2020   IR US GUIDE VASC ACCESS RIGHT  08/24/2020   SCLEROTHERAPY  08/23/2020   Procedure: Clide Deutscher;  Surgeon: Ronnette Juniper, MD;  Location: [facility];  Service: Gastroenterology;;   TONSILLECTOMY AND ADENOIDECTOMY  age 51   TUBAL LIGATION      FAMILY HISTORY: Family History  Problem Relation Age of  Onset   Dementia Mother    Hip fracture Mother        x 2   Alzheimer's disease Mother    Hypertension Mother    Heart disease Mother        pacemaker   Cancer Father        thyroid,lung,prostate   Diabetes Other    Breast cancer Maternal Grandmother    Heart attack Maternal Grandfather     SOCIAL HISTORY: Social History   Socioeconomic History   Marital status: Single    Spouse name: Not on file   Number of children: Not on file   Years of education: Not on file   Highest education level: Not on file  Occupational  History   Not on file  Tobacco Use   Smoking status: Never   Smokeless tobacco: Never  Vaping Use   Vaping Use: Never used  Substance and Sexual Activity   Alcohol use: No   Drug use: No   Sexual activity: Not Currently  Other Topics Concern   Not on file  Social History Narrative   Lives with dog.  2 stepchildren ([place] and [place]).  Widowed. 8 grandchildren, 67 great grandchildren   Social Determinants of Radio broadcast assistant Strain: Not on file  Food Insecurity: Not on file  Transportation Needs: Not on file  Physical Activity: Not on file  Stress: Not on file  Social Connections: Not on file  Intimate Partner Violence: Not on file      Marcial Pacas, M.D. Ph.D.  [facility] [address] [place], [place] [address] Ph: [phone] Fax: [phone]  CC:  Rita Ohara, [place],  [place] [address]  Rita Ohara, MD

## 2021-12-08 DIAGNOSIS — L718 Other rosacea: Secondary | ICD-10-CM | POA: Diagnosis not present

## 2021-12-08 DIAGNOSIS — L218 Other seborrheic dermatitis: Secondary | ICD-10-CM | POA: Diagnosis not present

## 2021-12-13 NOTE — Progress Notes (Signed)
Chief Complaint  Patient presents with   Annual Exam    Fasting CPE, no pelvic. Is still having some issues with neuropathy and is seeing a trainer. No other issues. Not on any osteoporosis treatment.    Elizabeth Haynes is a 76 y.o. female who presents for annual physical exam. AWV done 01/2021 (separately, due to her insurance). She has no concerns today.  Osteoporosis:  Previously took Fosamax for about 5 years (stopped around 2008).  DEXA was 08/2014 and showed decline in hips, T-2.4.  This was last checked 09/2016 by her GYN, showing further decline in all areas, most notably in Spine, where T went from -2.4 to -3.2.  She reports they put her on fosamax, took it for a year, then took herself off of it.  "I do not like that drug".  She had a reaction to oral surgery, had to take antibiotics, which caused GI issues; she thought fosamax also caused GI issues (diarrhea).    Last DEXA was 09/2020. T-3.2 at spine, and -2.5 at L femoral neck.  She is taking calcium and D supplements twice daily, plus separate D daily, and eats plenty of calcium-rich foods (yogurt, cottage cheese). Weight-bearing exercise 2x/week (once with trainer and once on her own).   We discussed medication/treatment options in detail at her 01/2021 visit.  She was going to do her own research and let us know which she would want [place] to look further into cost/coverage (ie Prolia, Evenity).  We did not hear back, not currently on treatment. "I don't those drugs in my body."  She has been seeing neurologist for paresthesias in her feet, and also had reported some back pain. Last visit with Dr. Krista Blue was last month. Overall the numbness has plateaued. She had normal EMG/NCV, MRI of lumbar spine showed only slight degenerative changes.  Due to some exam findings (brisk UE DTR's, bilat Babinski), to consider poss c-spine MRI if any worsening symptoms. On evaluation of her tingling, A1c was found to be slightly elevated at 5.9. Lab Results   Component Value Date   HGBA1C 5.9 (H) 07/27/2021  The numbness ("like my feet are made out of sponges", not tingling) is fairly constant, doesn't notice it while walking.  It is across the metatarsal heads and the lateral aspect of the foot (on the bottom only). There is no associated pain or worsening.   H/o hyperlipidemia, improved with dietary changes. She has excellent HDL. She doesn't eat red meat.  Some cheese.  Cooks with olive oil.  Uses almondmilk. 4 eggs/week (max). Avoids creamy dressings, soups, ice cream. (Occasional lactose-free, small portions). Lab Results  Component Value Date   CHOL 228 (H) 12/08/2020   HDL 80 12/08/2020   LDLCALC 142 (H) 12/08/2020   TRIG 38 12/08/2020   CHOLHDL 2.9 12/08/2020   Allergies: seasonally in spring and fall. Still flaring now (leaves, warmer weather).  She is currently dealing with postnasal drainage. She hasn't been taking zyrtec or flonase recently. She stopped these, thinking they worsened constipation.   She has h/o duodenal ulcer with bleed.  She underwent f/u EGD in 12/2020 with Dr. Therisa Doyne.  The ulcers had healed, but there was duodenal stenosis. We didn't get copy of pathology report.  She reports it showed scarring, benign. She is currently taking protonix twice daily.  She denies any reflux symptoms (but she never had any). She denies any abdominal pain or dysphagia.  She had UGI 12/2020: IMPRESSION: 1. Mild irregularity of the superior margin  of the duodenal bulb, compatible with mild scarring. No discrete residual duodenal ulcer. Embolization coils noted in the region of the duodenal bulb. 2. Spontaneous mild-to-moderate gastroesophageal reflux. No hiatal hernia. 3. Mild esophageal dysmotility, characteristic of chronic reflux related dysmotility. 4. Mild reflux esophagitis. No evidence of esophageal mass or stricture.  Immunization History  Administered Date(s) Administered   Fluad Quad(high Dose 65+) 09/02/2020    Influenza Split 08/11/2013, 08/16/2015   Influenza, High Dose Seasonal PF 09/26/2018, 09/27/2021   PFIZER(Purple Top)SARS-COV-2 Vaccination 02/10/2020, 03/02/2020, 09/17/2020   Pneumococcal Conjugate-13 04/01/2015   Pneumococcal Polysaccharide-23 07/15/2012   Tdap 07/15/2012   Zoster Recombinat (Shingrix) 04/02/2017, 06/02/2017   Zoster, Live 11/10/2009   She declines any further COVID vaccines/boosters. Last Pap smear: here 03/2015, negative, with no high risk HPV. She sees Dr. Valentino Saxon, (no pap done since then). Patient prefers not do get any more done. Last mammogram:  09/2021 Last colonoscopy: never. Had negative Cologuard in 11/2018 Last DEXA: 09/2020 T-3.2 spine, -2.5 L fem neck. Dentist: twice yearly Ophtho: yearly Exercise: walks the dog for about an hour every day. Weight training 2x/week (once with a trainer, once on her own, with 5 and 10# weights).   Vitamin D screen: 76.8 in 06/2018    Fall screen: none in the last year Depression screen:  negative  PMH, PSH, SH and FH were reviewed  Outpatient Encounter Medications as of 12/14/2021  Medication Sig Note   acetaminophen (TYLENOL) 500 MG tablet Take 500 mg by mouth every 6 (six) hours as needed. 12/14/2021: Took last night   Azelaic Acid (FINACEA EX) Apply topically.     b complex vitamins capsule Take 1 capsule by mouth daily.     calcium carbonate 1250 MG capsule Take 1,250 mg by mouth daily. 12/14/2021: Takes 690m twice daily   cholecalciferol (VITAMIN D) 1000 UNITS tablet Take 1,000 Units by mouth daily.     MAGNESIUM GLYCINATE PO Take 1 tablet by mouth in the morning, at noon, and at bedtime.    Multiple Vitamins-Minerals (MULTIVITAMIN WITH MINERALS) tablet Take 1 tablet by mouth daily.     pantoprazole (PROTONIX) 40 MG tablet Take 1 tablet (40 mg total) by mouth 2 (two) times daily.    vitamin E 1000 UNIT capsule Take 1,000 Units by mouth daily.     olopatadine (PATANOL) 0.1 % ophthalmic solution Place 1 drop into  both eyes daily as needed for allergies. (Patient not taking: Reported on 12/14/2021)    No facility-administered encounter medications on file as of 12/14/2021.   Allergies  Allergen Reactions   Tramadol Other (See Comments)    confusion   Bactrim [Sulfamethoxazole-Trimethoprim] Hives    ROS:  The patient denies anorexia, fever, weight changes, headaches,  vision changes, decreased hearing, ear pain, sore throat, breast concerns, chest pain, palpitations, dizziness, syncope, dyspnea on exertion, cough, swelling, nausea, vomiting, diarrhea, constipation, abdominal pain, melena, hematochezia, indigestion/heartburn, hematuria, incontinence, dysuria, vaginal bleeding, discharge, odor or itch, genital lesions, joint pains, numbness, tingling, weakness, tremor, suspicious skin lesions, depression, anxiety, abnormal bleeding/bruising, or enlarged lymph nodes.   Sees dermatologist--no mole changes or concerns.    No significant hot flashes. Equilibrium issues resolved. Neuropathy per HPI. Denies any reflux, dysphagia or abdominal pain.   PHYSICAL EXAM:  BP 120/70    Pulse 64    Ht 5' 1"  (1.549 m)    Wt 108 lb 3.2 oz (49.1 kg)    BMI 20.44 kg/m   Wt Readings from Last 3 Encounters:  12/14/21 108  lb 3.2 oz (49.1 kg)  11/08/21 110 lb 8 oz (50.1 kg)  07/21/21 105 lb (47.6 kg)    General Appearance:     Alert, cooperative, no distress, appears stated age.   Head:     Normocephalic, without obvious abnormality, atraumatic   Eyes:     PERRL, conjunctiva/corneas clear, EOM's intact, fundi benign   Ears:     Bilateral cerumen impaction noted.  Nose:    Nor examined, wearing mask due to COVID-19 pandemic  Throat:    Not examined, wearing mask due to COVID-19 pandemic.  Neck:    Supple, no lymphadenopathy;  thyroid:  no enlargement/tenderness/ nodules; no carotid bruit or JVD   Back:     Spine nontender, no curvature, ROM normal, no CVA tenderness   Lungs:      Clear to auscultation bilaterally  without wheezes, rales or ronchi; respirations unlabored   Chest Wall:     No tenderness or deformity    Heart:     Regular rate and rhythm, S1 and S2 normal, no murmur, rub or gallop   Breast Exam:    No nipple discharge, inversion, breast masses or axillary lymphadenopathy. Nipples appear different--R is normal.  The L has a flat portion at 10 o'clock/upper portion, unchanged per pt  Abdomen:      Soft, non-tender, nondistended, normoactive bowel sounds, no masses, no hepatosplenomegaly   Genitalia:    Declined today  Rectal:    Declined today  Extremities:    No clubbing, cyanosis or edema. Bunion deformities bilaterally  Pulses:    2+ and symmetric all extremities   Skin:    Skin color, texture, turgor normal. Many large SK's scattered around trunk, arms. Purpura noted on L hand  Lymph nodes:    Cervical, supraclavicular, inguinal and axillary nodes normal   Neurologic:    Normal strength, sensation and gait; reflexes 2+ and symmetric throughout.                              Psych:   Normal mood, affect, hygiene and grooming  Normal urine dip   ASSESSMENT/PLAN:  Annual physical exam - Plan: POCT Urinalysis DIP (Proadvantage Device), Comprehensive metabolic panel, Lipid panel, CBC with Differential/Platelet  Osteoporosis without current pathological fracture, unspecified osteoporosis type - counseled in detail re: risks, available treatments, length of tx. Reclast x 49yrvs Prolia indef. Pt not interested. Disc Ca/D/weights don't build bone, cont  Gastroesophageal reflux disease with esophagitis without hemorrhage - cont PPI  Neuropathy - stable, tolerable, evaluated by neuro (normal work-up) - Plan: Comprehensive metabolic panel, CBC with Differential/Platelet  Need for pneumococcal vaccination - Plan: Pneumococcal polysaccharide vaccine 23-valent greater than or equal to 2yo subcutaneous/IM  Screening for colon cancer - due for repeat Cologuard - Plan: Cologuard  Senile purpura  (HVictoria - educated, questions answered regarding her easy bruising/purpura - Plan: CBC with Differential/Platelet  Pure hypercholesterolemia - due for recheck. Low cholesterol diet briefly reviewed - Plan: Lipid panel  Prediabetes - A1c of 5.9 on labs per neuro for neuropathy.  Check fasting glu (her ins doesn't cover A1c more than 1x/yr). Reviewed proper diet, cont exercise - Plan: Comprehensive metabolic panel  CBC, c-met, lipid  Discussed monthly self breast exams and yearly mammograms; at least 30 minutes of aerobic activity at least 5 days/week, weight bearing exercise at least 2x/week; proper sunscreen use reviewed; healthy diet, including goals of calcium and vitamin D intake and  alcohol recommendations (less than or equal to 1 drink/day) reviewed; regular seatbelt use; changing batteries in smoke detectors.  Immunization recommendations discussed. Continue yearly high dose flu shots. Pneumovax booster was given. COVID booster declined. Tdap booster is due, to get from the pharmacy Colon cancer screening reviewed, due for repeat Cologuard. Repeat DEXA 09/2022.     F/u for AWV--discussed ok to wait 6 mos for this (she may prefer to come a little sooner; last AWV was in March).

## 2021-12-13 NOTE — Patient Instructions (Addendum)
°  HEALTH MAINTENANCE RECOMMENDATIONS:  It is recommended that you get at least 30 minutes of aerobic exercise at least 5 days/week (for weight loss, you may need as much as 60-90 minutes). This can be any activity that gets your heart rate up. This can be divided in 10-15 minute intervals if needed, but try and build up your endurance at least once a week.  Weight bearing exercise is also recommended twice weekly.  Eat a healthy diet with lots of vegetables, fruits and fiber.  "Colorful" foods have a lot of vitamins (ie green vegetables, tomatoes, red peppers, etc).  Limit sweet tea, regular sodas and alcoholic beverages, all of which has a lot of calories and sugar.  Up to 1 alcoholic drink daily may be beneficial for women (unless trying to lose weight, watch sugars).  Drink a lot of water.  Calcium recommendations are 1200-1500 mg daily (1500 mg for postmenopausal women or women without ovaries), and vitamin D 1000 IU daily.  This should be obtained from diet and/or supplements (vitamins), and calcium should not be taken all at once, but in divided doses.  Monthly self breast exams and yearly mammograms for women over the age of 77 is recommended.  Sunscreen of at least SPF 30 should be used on all sun-exposed parts of the skin when outside between the hours of 10 am and 4 pm (not just when at beach or pool, but even with exercise, golf, tennis, and yard work!)  Use a sunscreen that says "broad spectrum" so it covers both UVA and UVB rays, and make sure to reapply every 1-2 hours.  Remember to change the batteries in your smoke detectors when changing your clock times in the spring and fall. Carbon monoxide detectors are recommended for your home.  Use your seat belt every time you are in a car, and please drive safely and not be distracted with cell phones and texting while driving.  You are due for a tetanus booster (TdaP).  You will need to get this from the pharmacy.  This should be separated  from any other vaccines by at least 2 weeks. Technically your 10 years will be up in August, but it is fine to get sooner. I recommend the bivalent COVID booster.  We discussed Reclast infusion (once yearly for 3 years) vs Prolia injections every 6 months (indefinitely) to help strengthen your bones to reduce fracture risk.  What you are doing is hopefully maintaining your current bones, rather than continuing to lose, but will not make them stronger. Your next bone density test will be due 09/2022. Let us know if you decide you are interested in either of these treatments now, or else we will re-address at subsequent visits, and after your next bone density test.  Look at your diet and your vitamins. The goal is a TOTAL of 1500mg  of calcium daily.  The calcium may be contributing to constipation. See if you truly need 2 doses per day.  If you are getting 500-600mg  of calcium daily from your diet, and your multivitamin also has some calcium, you may be able to drop down to just 1 calcium pill daily.  PLEASE TRY AND REMEMBER TO BRING YOUR PINK "MOST" FORM WITH YOU TO YOUR MEDICARE WELLNESS VISIT.

## 2021-12-14 ENCOUNTER — Encounter: Payer: Self-pay | Admitting: Family Medicine

## 2021-12-14 ENCOUNTER — Ambulatory Visit (INDEPENDENT_AMBULATORY_CARE_PROVIDER_SITE_OTHER): Payer: Medicare Other | Admitting: Family Medicine

## 2021-12-14 ENCOUNTER — Other Ambulatory Visit: Payer: Self-pay

## 2021-12-14 VITALS — BP 120/70 | HR 64 | Ht 61.0 in | Wt 108.2 lb

## 2021-12-14 DIAGNOSIS — M810 Age-related osteoporosis without current pathological fracture: Secondary | ICD-10-CM

## 2021-12-14 DIAGNOSIS — R7303 Prediabetes: Secondary | ICD-10-CM

## 2021-12-14 DIAGNOSIS — Z0000 Encounter for general adult medical examination without abnormal findings: Secondary | ICD-10-CM | POA: Diagnosis not present

## 2021-12-14 DIAGNOSIS — Z23 Encounter for immunization: Secondary | ICD-10-CM

## 2021-12-14 DIAGNOSIS — G629 Polyneuropathy, unspecified: Secondary | ICD-10-CM

## 2021-12-14 DIAGNOSIS — D692 Other nonthrombocytopenic purpura: Secondary | ICD-10-CM | POA: Diagnosis not present

## 2021-12-14 DIAGNOSIS — Z1211 Encounter for screening for malignant neoplasm of colon: Secondary | ICD-10-CM | POA: Diagnosis not present

## 2021-12-14 DIAGNOSIS — K2100 Gastro-esophageal reflux disease with esophagitis, without bleeding: Secondary | ICD-10-CM | POA: Diagnosis not present

## 2021-12-14 DIAGNOSIS — E7800 Pure hypercholesterolemia, unspecified: Secondary | ICD-10-CM

## 2021-12-14 LAB — POCT URINALYSIS DIP (PROADVANTAGE DEVICE)
Bilirubin, UA: NEGATIVE
Blood, UA: NEGATIVE
Glucose, UA: NEGATIVE mg/dL
Ketones, POC UA: NEGATIVE mg/dL
Leukocytes, UA: NEGATIVE
Nitrite, UA: NEGATIVE
Protein Ur, POC: NEGATIVE mg/dL
Specific Gravity, Urine: 1.005
Urobilinogen, Ur: NEGATIVE
pH, UA: 7.5 (ref 5.0–8.0)

## 2021-12-15 LAB — COMPREHENSIVE METABOLIC PANEL
ALT: 26 IU/L (ref 0–32)
AST: 32 IU/L (ref 0–40)
Albumin/Globulin Ratio: 1.7 (ref 1.2–2.2)
Albumin: 5 g/dL — ABNORMAL HIGH (ref 3.7–4.7)
Alkaline Phosphatase: 84 IU/L (ref 44–121)
BUN/Creatinine Ratio: 16 (ref 12–28)
BUN: 12 mg/dL (ref 8–27)
Bilirubin Total: 0.7 mg/dL (ref 0.0–1.2)
CO2: 23 mmol/L (ref 20–29)
Calcium: 10 mg/dL (ref 8.7–10.3)
Chloride: 95 mmol/L — ABNORMAL LOW (ref 96–106)
Creatinine, Ser: 0.74 mg/dL (ref 0.57–1.00)
Globulin, Total: 3 g/dL (ref 1.5–4.5)
Glucose: 98 mg/dL (ref 70–99)
Potassium: 4.5 mmol/L (ref 3.5–5.2)
Sodium: 135 mmol/L (ref 134–144)
Total Protein: 8 g/dL (ref 6.0–8.5)
eGFR: 84 mL/min/{1.73_m2} (ref >59)

## 2021-12-15 LAB — CBC WITH DIFFERENTIAL/PLATELET
Basophils Absolute: 0.1 10*3/uL (ref 0.0–0.2)
Basos: 1 %
EOS (ABSOLUTE): 0.4 10*3/uL (ref 0.0–0.4)
Eos: 7 %
Hematocrit: 38.5 % (ref 34.0–46.6)
Hemoglobin: 13 g/dL (ref 11.1–15.9)
Immature Grans (Abs): 0 10*3/uL (ref 0.0–0.1)
Immature Granulocytes: 0 %
Lymphocytes Absolute: 2.2 10*3/uL (ref 0.7–3.1)
Lymphs: 40 %
MCH: 30.5 pg (ref 26.6–33.0)
MCHC: 33.8 g/dL (ref 31.5–35.7)
MCV: 90 fL (ref 79–97)
Monocytes Absolute: 0.6 10*3/uL (ref 0.1–0.9)
Monocytes: 11 %
Neutrophils Absolute: 2.2 10*3/uL (ref 1.4–7.0)
Neutrophils: 41 %
Platelets: 339 10*3/uL (ref 150–450)
RBC: 4.26 x10E6/uL (ref 3.77–5.28)
RDW: 11.7 % (ref 11.7–15.4)
WBC: 5.3 10*3/uL (ref 3.4–10.8)

## 2021-12-15 LAB — LIPID PANEL
Chol/HDL Ratio: 2.9 ratio (ref 0.0–4.4)
Cholesterol, Total: 273 mg/dL — ABNORMAL HIGH (ref 100–199)
HDL: 95 mg/dL (ref >39)
LDL Chol Calc (NIH): 170 mg/dL — ABNORMAL HIGH (ref 0–99)
Triglycerides: 56 mg/dL (ref 0–149)
VLDL Cholesterol Cal: 8 mg/dL (ref 5–40)

## 2021-12-26 DIAGNOSIS — Z1211 Encounter for screening for malignant neoplasm of colon: Secondary | ICD-10-CM | POA: Diagnosis not present

## 2021-12-29 DIAGNOSIS — H524 Presbyopia: Secondary | ICD-10-CM | POA: Diagnosis not present

## 2022-01-02 LAB — COLOGUARD: COLOGUARD: POSITIVE — AB

## 2022-01-03 ENCOUNTER — Telehealth: Payer: Self-pay | Admitting: Family Medicine

## 2022-01-03 NOTE — Telephone Encounter (Signed)
No, I don't recommend repeating the Cologuard (and likely insurance wouldn't pay for that). I had sent her result to [place] asking to refer her back to her GI, Dr. Marca Ancona.  They can have a visit/discussion about next steps, but I defer that to her gastroenterologist.

## 2022-01-03 NOTE — Telephone Encounter (Signed)
Pt called and states that she seen that seen that her cologuard was post ive, she wants to know if she can have another cologuard to make sure that this was not a false positive before having a colonoscopy, she states that she ate something before and that could have caused it, Pt would like to know your thoughts on it, Informed pt that you where not in the office today

## 2022-01-04 ENCOUNTER — Other Ambulatory Visit: Payer: Self-pay | Admitting: *Deleted

## 2022-01-04 DIAGNOSIS — R195 Other fecal abnormalities: Secondary | ICD-10-CM

## 2022-02-17 ENCOUNTER — Encounter: Payer: Self-pay | Admitting: Podiatry

## 2022-02-17 ENCOUNTER — Other Ambulatory Visit: Payer: Self-pay

## 2022-02-17 ENCOUNTER — Ambulatory Visit: Payer: Medicare Other | Admitting: Podiatry

## 2022-02-17 DIAGNOSIS — Q666 Other congenital valgus deformities of feet: Secondary | ICD-10-CM

## 2022-02-17 NOTE — Progress Notes (Signed)
?Subjective:  ?Patient ID: Elizabeth Haynes, female    DOB: [birth year],  MRN: [id number] ? ?Chief Complaint  ?Patient presents with  ? Foot Orthotics  ? ? ?76 y.o. female presents with the above complaint.  Patient presents with complaint of bilateral pes planovalgus with bunion deformity.  She states she walks 3 to 4 miles.  She has orthotics that were done about more than 15 years ago ?Where.  She would like to know if she can make a new pair of orthotics.  She has not seen anyone else prior to seeing me.  She denies any other acute complaints. ? ? ?Review of Systems: Negative except as noted in the HPI. Denies N/V/F/Ch. ? ?Past Medical History:  ?Diagnosis Date  ? Duodenal ulcer   ? GI bleed 2021  ? Osteopenia   ? Osteoporosis 09/2016  ? Rosacea   ? Symptomatic menopausal or female climacteric states   ? ? ?Current Outpatient Medications:  ?  acetaminophen (TYLENOL) 500 MG tablet, Take 500 mg by mouth every 6 (six) hours as needed., Disp: , Rfl:  ?  Azelaic Acid (FINACEA EX), Apply topically. , Disp: , Rfl:  ?  b complex vitamins capsule, Take 1 capsule by mouth daily. , Disp: , Rfl:  ?  calcium carbonate 1250 MG capsule, Take 1,250 mg by mouth daily., Disp: , Rfl:  ?  cholecalciferol (VITAMIN D) 1000 UNITS tablet, Take 1,000 Units by mouth daily. , Disp: , Rfl:  ?  MAGNESIUM GLYCINATE PO, Take 1 tablet by mouth in the morning, at noon, and at bedtime., Disp: , Rfl:  ?  Multiple Vitamins-Minerals (MULTIVITAMIN WITH MINERALS) tablet, Take 1 tablet by mouth daily. , Disp: , Rfl:  ?  olopatadine (PATANOL) 0.1 % ophthalmic solution, Place 1 drop into both eyes daily as needed for allergies. (Patient not taking: Reported on 12/14/2021), Disp: , Rfl:  ?  pantoprazole (PROTONIX) 40 MG tablet, Take 1 tablet (40 mg total) by mouth 2 (two) times daily., Disp: 60 tablet, Rfl: 3 ?  vitamin E 1000 UNIT capsule, Take 1,000 Units by mouth daily. , Disp: , Rfl:  ? ?Social History  ? ?Tobacco Use  ?Smoking Status Never  ?Smokeless  Tobacco Never  ? ? ?Allergies  ?Allergen Reactions  ? Tramadol Other (See Comments)  ?  confusion  ? Bactrim [Sulfamethoxazole-Trimethoprim] Hives  ? ?Objective:  ?There were no vitals filed for this visit. ?There is no height or weight on file to calculate BMI. ?Constitutional Well developed. ?Well nourished.  ?Vascular Dorsalis pedis pulses palpable bilaterally. ?Posterior tibial pulses palpable bilaterally. ?Capillary refill normal to all digits.  ?No cyanosis or clubbing noted. ?Pedal hair growth normal.  ?Neurologic Normal speech. ?Oriented to person, place, and time. ?Epicritic sensation to light touch grossly present bilaterally.  ?Dermatologic Nails well groomed and normal in appearance. ?No open wounds. ?No skin lesions.  ?Orthopedic: Gait examination shows pes planovalgus with calcaneovalgus to many toe signs partially recruit the arch with dorsiflexion of the hallux.  Bilateral bunion deformity noted severe in nature.  ? ?Radiographs: None ?Assessment:  ? ?1. Pes planovalgus   ? ?Plan:  ?Patient was evaluated and treated and all questions answered. ? ?Bilateral pes planovalgus ?-I explained the patient the etiology of pes planovalgus and worse treatment options were discussed.  I believe she will benefit from custom-made orthotics to help control the hindfoot motion support the arch of the foot take the structure and pressure away from the arch.  She already has previous  orthotics that were made.  She would like to have something similar. ?-She will be scheduled to see Arlys John for orthotics ? ?No follow-ups on file.  ?

## 2022-02-20 ENCOUNTER — Other Ambulatory Visit: Payer: Self-pay

## 2022-02-20 ENCOUNTER — Ambulatory Visit: Payer: Medicare Other

## 2022-02-20 DIAGNOSIS — Q666 Other congenital valgus deformities of feet: Secondary | ICD-10-CM

## 2022-02-20 NOTE — Progress Notes (Signed)
SITUATION ?Reason for Consult: Evaluation for Bilateral Custom Foot Orthoses ?Patient / Caregiver Report: Patient is ready for foot orthotics ? ?OBJECTIVE DATA: ?Patient History / Diagnosis:  ?  ICD-10-CM   ?1. Pes planovalgus  Q66.6   ?  ? ? ?Current or Previous Devices:   Historical user ? ?Foot Examination: ?Skin presentation:   Intact ?Ulcers & Callousing:   None ?Toe / Foot Deformities:  Bunions, Pes Planus ?Weight Bearing Presentation:  Planus ?Sensation:    Compromised ? ?Shoe Size:    9 ? ?ORTHOTIC RECOMMENDATION ?Recommended Device: 1x pair of custom functional foot orthotics ? ?GOALS OF ORTHOSES ?- Reduce Pain ?- Prevent Foot Deformity ?- Prevent Progression of Further Foot Deformity ?- Relieve Pressure ?- Improve the Overall Biomechanical Function of the Foot and Lower Extremity. ? ?ACTIONS PERFORMED ?Potential out of pocket cost was communicated to patient. Patient understood and consent to casting. Patient was casted for Foot Orthoses via crush box. Procedure was explained and patient tolerated procedure well. Casts were shipped to central fabrication. All questions were answered and concerns addressed. ? ?PLAN ?Patient is to be called for fitting when devices are ready.  ? ? ?

## 2022-02-22 DIAGNOSIS — Z8719 Personal history of other diseases of the digestive system: Secondary | ICD-10-CM | POA: Diagnosis not present

## 2022-02-22 DIAGNOSIS — R195 Other fecal abnormalities: Secondary | ICD-10-CM | POA: Diagnosis not present

## 2022-02-22 LAB — FECAL OCCULT BLOOD, GUAIAC: Fecal Occult Blood: NEGATIVE

## 2022-02-22 LAB — COLOGUARD: Cologuard: POSITIVE — AB

## 2022-02-23 DIAGNOSIS — R195 Other fecal abnormalities: Secondary | ICD-10-CM | POA: Diagnosis not present

## 2022-03-24 ENCOUNTER — Ambulatory Visit (INDEPENDENT_AMBULATORY_CARE_PROVIDER_SITE_OTHER): Payer: Medicare Other

## 2022-03-24 DIAGNOSIS — Q666 Other congenital valgus deformities of feet: Secondary | ICD-10-CM | POA: Diagnosis not present

## 2022-03-24 NOTE — Progress Notes (Signed)
SITUATION: ?Reason for Visit: Fitting and Delivery of Custom Fabricated Foot Orthoses ?Patient Report: Patient reports comfort and is satisfied with device. ? ?OBJECTIVE DATA: ?Patient History / Diagnosis:   ?  ICD-10-CM   ?1. Pes planovalgus  Q66.6   ?  ? ? ?Provided Device:  Custom Functional Foot Orthotics ?    [employer]: [id number] ? ?GOAL OF ORTHOSIS ?- Improve gait ?- Decrease energy expenditure ?- Improve Balance ?- Provide Triplanar stability of foot complex ?- Facilitate motion ? ?ACTIONS PERFORMED ?Patient was fit with foot orthotics trimmed to shoe last. Patient tolerated fittign procedure.  ? ?Patient was provided with verbal and written instruction and demonstration regarding donning, doffing, wear, care, proper fit, function, purpose, cleaning, and use of the orthosis and in all related precautions and risks and benefits regarding the orthosis. ? ?Patient was also provided with verbal instruction regarding how to report any failures or malfunctions of the orthosis and necessary follow up care. Patient was also instructed to contact our office regarding any change in status that may affect the function of the orthosis. ? ?Patient demonstrated independence with proper donning, doffing, and fit and verbalized understanding of all instructions. ? ?PLAN: ?Patient is to follow up in one week or as necessary (PRN). All questions were answered and concerns addressed. Plan of care was discussed with and agreed upon by the patient. ? ?

## 2022-04-18 ENCOUNTER — Ambulatory Visit: Payer: Medicare Other

## 2022-04-18 DIAGNOSIS — Q666 Other congenital valgus deformities of feet: Secondary | ICD-10-CM

## 2022-04-18 NOTE — Progress Notes (Signed)
SITUATION Reason for Consult: Follow-up with foot orthotics Patient / Caregiver Report: Patient feels heel walls are too tall  OBJECTIVE DATA History / Diagnosis:    ICD-10-CM   1. Pes planovalgus  Q66.6       Change in Pathology: None  ACTIONS PERFORMED Patient's equipment was checked for structural stability and fit. Adjusted orthoses. Device(s) intact and fit is excellent. All questions answered and concerns addressed.  PLAN Follow-up as needed (PRN). Plan of care discussed with and agreed upon by patient / caregiver.

## 2022-05-02 ENCOUNTER — Ambulatory Visit: Payer: Medicare Other

## 2022-05-02 DIAGNOSIS — Q666 Other congenital valgus deformities of feet: Secondary | ICD-10-CM

## 2022-05-02 NOTE — Progress Notes (Signed)
SITUATION Reason for Consult: Follow-up with foot orthotics Patient / Caregiver Report: Patient has pain in arch and heel  OBJECTIVE DATA History / Diagnosis:    ICD-10-CM   1. Pes planovalgus  Q66.6       Change in Pathology: None  ACTIONS PERFORMED Patient's equipment was checked for structural stability and fit. Adjusted slope of heel and proximal arch. Device(s) intact and fit is excellent. All questions answered and concerns addressed.  PLAN Follow-up as needed (PRN). Plan of care discussed with and agreed upon by patient / caregiver.

## 2022-05-10 ENCOUNTER — Encounter: Payer: Self-pay | Admitting: Family Medicine

## 2022-05-10 ENCOUNTER — Ambulatory Visit (INDEPENDENT_AMBULATORY_CARE_PROVIDER_SITE_OTHER): Payer: Medicare Other | Admitting: Family Medicine

## 2022-05-10 VITALS — BP 130/80 | HR 60 | Ht 61.0 in | Wt 108.8 lb

## 2022-05-10 DIAGNOSIS — H6123 Impacted cerumen, bilateral: Secondary | ICD-10-CM

## 2022-05-10 NOTE — Patient Instructions (Addendum)
You can use Debrox drops (or ask pharmacist about alternatives) to help soften the wax. You can use this in the left ear to soften the wax.  This will make wax removal easier, when needed. You should come for a treatment of the left ear only if/when you have plugging, decreased hearing or discomfort.  I think the right ear should be good for a while. If you note recurrent problems, start with the Debrox drops to help [place] out.

## 2022-05-10 NOTE — Progress Notes (Signed)
Chief Complaint  Patient presents with   Acute Visit    Right ear is stopped up. She said she has a lot sinus drainage. She is also have some dizziness.     Right ear feels plugged, full, can't hear. She denies ear pain. She has had some sinus issues. Some cough related to allergies, PND  PMH, PSH, SH reviewed  Outpatient Encounter Medications as of 05/10/2022  Medication Sig Note   acetaminophen (TYLENOL) 500 MG tablet Take 500 mg by mouth every 6 (six) hours as needed.    Azelaic Acid (FINACEA EX) Apply topically.     b complex vitamins capsule Take 1 capsule by mouth daily.     calcium carbonate 1250 MG capsule Take 1,250 mg by mouth daily. 12/14/2021: Takes 600mg  twice daily   cholecalciferol (VITAMIN D) 1000 UNITS tablet Take 1,000 Units by mouth daily.     MAGNESIUM GLYCINATE PO Take 1 tablet by mouth in the morning, at noon, and at bedtime.    Multiple Vitamins-Minerals (MULTIVITAMIN WITH MINERALS) tablet Take 1 tablet by mouth daily.     vitamin E 1000 UNIT capsule Take 1,000 Units by mouth daily.     olopatadine (PATANOL) 0.1 % ophthalmic solution Place 1 drop into both eyes daily as needed for allergies. (Patient not taking: Reported on 12/14/2021)    pantoprazole (PROTONIX) 40 MG tablet Take 1 tablet (40 mg total) by mouth 2 (two) times daily. 05/10/2022: Taking   No facility-administered encounter medications on file as of 05/10/2022.   Allergies  Allergen Reactions   Tramadol Other (See Comments)    confusion   Bactrim [Sulfamethoxazole-Trimethoprim] Hives   ROS: no fever, chills, sore throat, chest pain, shortness of breath.  Some allergies and slight cough from PND. Decreased hearing R ear per HPI, normal on the left. No other concerns   PHYSICAL EXAM:  BP 130/80   Pulse 60   Ht 5\' 1"  (1.549 m)   Wt 108 lb 12.8 oz (49.4 kg)   SpO2 100%   BMI 20.56 kg/m   Pleasant, well-appearing female in no distress HEENT: conjunctiva and sclera are clear, EOMI.  Nasal  mucosa--mild edema and some crusting L>R. Sinuses nontender. OP clear Cerumen impaction bilaterally Neck: no lymphadenopathy, thyromegaly or mass Heart: regular rate and rhythm Lungs: clear Extremities: no edema Neuro: alert and oriented, cranial nerves grossly intact, normal gait.  ASSESSMENT/PLAN:  Bilateral impacted cerumen   S/p ear lavage on the right. Some loose epithelial tissue left, but TM visualized and normal. Patient felt much better, hearing was back to normal.  Did not lavage the left side, given lack of symptoms, and difficulty on the right. She was encouraged to use Debrox drops, and return for left ear lavage if/when symptomatic.

## 2022-05-16 ENCOUNTER — Encounter: Payer: Self-pay | Admitting: Medical

## 2022-05-16 ENCOUNTER — Ambulatory Visit (INDEPENDENT_AMBULATORY_CARE_PROVIDER_SITE_OTHER): Payer: Medicare Other | Admitting: Medical

## 2022-05-16 VITALS — BP 158/88 | HR 60 | Resp 18 | Ht 61.0 in | Wt 106.4 lb

## 2022-05-16 DIAGNOSIS — H9190 Unspecified hearing loss, unspecified ear: Secondary | ICD-10-CM

## 2022-05-16 DIAGNOSIS — H6123 Impacted cerumen, bilateral: Secondary | ICD-10-CM | POA: Diagnosis not present

## 2022-05-16 NOTE — Progress Notes (Signed)
Subjective:   Chief Complaint  Patient presents with   Cerumen Impaction    Left ear clogges.    Here for complaint of ear pressure, decreased hearing. Was here last week for similar, had some wax removed, but still feels clogged up in ears, worse on left.  Has some chronic sinus pressure and chronic post nasal drip.  No sore throat, fever, cough, sneezing.  No other aggravating or relieving factors.  No other complaint.  ROS as in subjective     Objective:   Physical Exam  General appearance: alert, no distress, WD/WN Ears: small/narrow ear canals in general, moderate cerumen in each canal, unable to visualize TMs HENT: conjunctiva/corneas normal, sclerae anicteric, nares patent, no discharge or erythema, pharynx normal Oral cavity: MMM, tongue normal, teeth normal Neurological: Hearing normal bilaterally to whisper    Assessment & Plan:    Encounter Diagnoses  Name Primary?   Decreased hearing, unspecified laterality Yes   Bilateral impacted cerumen      Discussed findings.  Discussed risk/benefits of procedure and patient agrees to procedure. Successfully used warm water lavage to remove impacted cerumen from left ear canal.   We got some additional cerumen out of the right ear canal.  However she has very narrow ear canals so not completely sure we got all the wax out.  Advised to give it a few days to see how she feels.  If still feeling clogged up in the right ear the next few days she can either try a few drops of Debrox a couple times a week for the next few weeks or we can refer to ENT.  She will let us know  Patient tolerated procedure well. Advised they avoid using any cotton swabs or other devices to clean the ear canals.  Use basic hygiene as discussed.  Follow up prn.   F/u prn
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
## 2022-05-18 ENCOUNTER — Ambulatory Visit: Payer: Medicare Other | Admitting: Family Medicine

## 2022-05-18 ENCOUNTER — Encounter: Payer: Self-pay | Admitting: Family Medicine

## 2022-05-18 VITALS — BP 138/84 | HR 72 | Ht 61.0 in | Wt 105.8 lb

## 2022-05-18 DIAGNOSIS — F419 Anxiety disorder, unspecified: Secondary | ICD-10-CM

## 2022-05-18 MED ORDER — DIAZEPAM 5 MG PO TABS: 2.5000 mg | ORAL_TABLET | Freq: Every evening | ORAL | 0 refills | Status: DC | PRN

## 2022-05-18 NOTE — Patient Instructions (Signed)
  We are going to continue the low dose valium that you have taken the last 2 nights, since it has worked well.  You previously took a similar medication, lorazepam.  These medications can be addicting, and can increase your risk for falls. Please use this sparingly. Please work on your other coping mechanisms and stress reduction techniques to help you with your stress from this current situation. Use the valium if you aren't able to relax and calm yourself down, and if you haven't been able to get sleep without it. Don't plan to use it every single night.

## 2022-06-12 ENCOUNTER — Ambulatory Visit (INDEPENDENT_AMBULATORY_CARE_PROVIDER_SITE_OTHER): Payer: Medicare Other | Admitting: Family Medicine

## 2022-06-12 ENCOUNTER — Encounter: Payer: Self-pay | Admitting: Family Medicine

## 2022-06-12 VITALS — BP 150/74 | HR 76 | Temp 98.4°F | Ht 61.0 in | Wt 105.2 lb

## 2022-06-12 DIAGNOSIS — F419 Anxiety disorder, unspecified: Secondary | ICD-10-CM

## 2022-06-12 DIAGNOSIS — K146 Glossodynia: Secondary | ICD-10-CM

## 2022-06-12 MED ORDER — DIAZEPAM 5 MG PO TABS: 2.5000 mg | ORAL_TABLET | Freq: Every evening | ORAL | 0 refills | Status: DC | PRN
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
## 2022-06-25 NOTE — Patient Instructions (Incomplete)
  Ms. Elizabeth Haynes , Thank you for taking time to come for your Medicare Wellness Visit. I appreciate your ongoing commitment to your health goals. Please review the following plan we discussed and let me know if I can assist you in the future.   This is a list of the screening recommended for you and due dates:  Health Maintenance  Topic Date Due   COVID-19 Vaccine (4 - Pfizer series) 11/12/2020   Flu Shot  06/27/2022   Tetanus Vaccine  07/15/2022   Cologuard (Stool DNA test)  12/26/2024   Pneumonia Vaccine  Completed   DEXA scan (bone density measurement)  Completed   Hepatitis C Screening: USPSTF Recommendation to screen - Ages 59-79 yo.  Completed   Zoster (Shingles) Vaccine  Completed   HPV Vaccine  Aged Out   Please get your Tdap (tetanus booster) from the pharmacy in August. Continue to get high dose flu shots in the Fall (September). I encourage you to get the updated bivalent COVID booster that will be coming out this Fall. Consider getting the new RSV vaccine when it is available. Ignore the date of Cologuard above--this is simply 3 years from the last one.  I will leave it up to your gastroenterologist to determine what further screening is needed (we have not yet received any notes from them, from your March visit).  Let us know if you change your mind regarding treatment for osteoporosis. There is no point in checking another bone density test if treatment will be declined regardless of the result. Based on your last bone density test, bone-building therapy is strongly encouraged, in order to prevent any future fractures.   In the meantime, continue calcium, vitamin D and weight bearing exercise, as discussed, in order to try and slow the decline.
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
## 2022-06-26 ENCOUNTER — Ambulatory Visit (INDEPENDENT_AMBULATORY_CARE_PROVIDER_SITE_OTHER): Payer: Medicare Other | Admitting: Family Medicine

## 2022-06-26 ENCOUNTER — Encounter: Payer: Self-pay | Admitting: Family Medicine

## 2022-06-26 VITALS — BP 128/72 | HR 64 | Ht 61.0 in | Wt 104.0 lb

## 2022-06-26 DIAGNOSIS — M810 Age-related osteoporosis without current pathological fracture: Secondary | ICD-10-CM | POA: Diagnosis not present

## 2022-06-26 DIAGNOSIS — K2100 Gastro-esophageal reflux disease with esophagitis, without bleeding: Secondary | ICD-10-CM

## 2022-06-26 DIAGNOSIS — R7303 Prediabetes: Secondary | ICD-10-CM | POA: Diagnosis not present

## 2022-06-26 DIAGNOSIS — E7800 Pure hypercholesterolemia, unspecified: Secondary | ICD-10-CM

## 2022-06-26 DIAGNOSIS — Z0000 Encounter for general adult medical examination without abnormal findings: Secondary | ICD-10-CM | POA: Diagnosis not present

## 2022-06-26 DIAGNOSIS — Z7189 Other specified counseling: Secondary | ICD-10-CM | POA: Diagnosis not present

## 2022-06-26 DIAGNOSIS — R195 Other fecal abnormalities: Secondary | ICD-10-CM

## 2022-06-27 LAB — HEMOGLOBIN A1C
Est. average glucose Bld gHb Est-mCnc: 120 mg/dL
Hgb A1c MFr Bld: 5.8 % — ABNORMAL HIGH (ref 4.8–5.6)

## 2022-06-27 LAB — LIPID PANEL
Chol/HDL Ratio: 2.4 ratio (ref 0.0–4.4)
Cholesterol, Total: 218 mg/dL — ABNORMAL HIGH (ref 100–199)
HDL: 89 mg/dL (ref >39)
LDL Chol Calc (NIH): 120 mg/dL — ABNORMAL HIGH (ref 0–99)
Triglycerides: 52 mg/dL (ref 0–149)
VLDL Cholesterol Cal: 9 mg/dL (ref 5–40)

## 2022-06-27 LAB — GLUCOSE, RANDOM: Glucose: 98 mg/dL (ref 70–99)

## 2022-06-28 ENCOUNTER — Ambulatory Visit (INDEPENDENT_AMBULATORY_CARE_PROVIDER_SITE_OTHER): Payer: Medicare Other

## 2022-06-28 DIAGNOSIS — Q666 Other congenital valgus deformities of feet: Secondary | ICD-10-CM

## 2022-06-28 NOTE — Progress Notes (Signed)
Patient requested orthotic adjustments at today's visit.   Unfortunately we were unable to make the requested adjustment in the office today. We did keep the orthotics to send out to [facility] for the correction to be completed. The orthotics are still under warranty with [facility] at this time.   Advised patient that we would call when orthotics are back in the office from [facility].

## 2022-08-02 ENCOUNTER — Encounter: Payer: Self-pay | Admitting: Internal Medicine

## 2022-08-16 ENCOUNTER — Telehealth: Payer: Self-pay | Admitting: Podiatry

## 2022-08-16 NOTE — Telephone Encounter (Signed)
Spoke w/ pt in regards to orthos, let her know that they are not in yet. They have not been shipped back to [place] from [place], who is doing the adjustments. We will notify pt when they arrive.

## 2022-08-29 ENCOUNTER — Ambulatory Visit (INDEPENDENT_AMBULATORY_CARE_PROVIDER_SITE_OTHER): Payer: Medicare Other | Admitting: *Deleted

## 2022-08-29 DIAGNOSIS — Q666 Other congenital valgus deformities of feet: Secondary | ICD-10-CM

## 2022-08-29 NOTE — Progress Notes (Signed)
Patient presents today to pick up custom molded foot orthotics, diagnosed with pes planovalgus by Dr. Posey Pronto.   Orthotics were dispensed and fit was satisfactory. Reviewed instructions for break-in and wear. Written instructions given to patient.  Patient will follow up as needed.   [facility] - order # [id number]

## 2022-09-05 ENCOUNTER — Encounter: Payer: Self-pay | Admitting: Internal Medicine

## 2022-10-25 DIAGNOSIS — Z1231 Encounter for screening mammogram for malignant neoplasm of breast: Secondary | ICD-10-CM | POA: Diagnosis not present

## 2022-10-25 LAB — HM MAMMOGRAPHY

## 2022-11-02 ENCOUNTER — Encounter: Payer: Self-pay | Admitting: *Deleted

## 2022-11-07 DIAGNOSIS — M85851 Other specified disorders of bone density and structure, right thigh: Secondary | ICD-10-CM | POA: Diagnosis not present

## 2022-11-07 DIAGNOSIS — M810 Age-related osteoporosis without current pathological fracture: Secondary | ICD-10-CM | POA: Diagnosis not present

## 2022-11-07 DIAGNOSIS — Z780 Asymptomatic menopausal state: Secondary | ICD-10-CM | POA: Diagnosis not present

## 2022-11-07 LAB — HM DEXA SCAN

## 2022-11-13 ENCOUNTER — Telehealth: Payer: Self-pay | Admitting: Internal Medicine

## 2022-11-13 ENCOUNTER — Encounter: Payer: Self-pay | Admitting: Internal Medicine

## 2022-11-13 NOTE — Telephone Encounter (Signed)
Pt was notified of bone density results. Pt states she will most likely still not take any treatment for this but she will discuss this at her appointment in February with you. Just FYI

## 2022-11-21 DIAGNOSIS — L218 Other seborrheic dermatitis: Secondary | ICD-10-CM | POA: Diagnosis not present

## 2022-11-21 DIAGNOSIS — L718 Other rosacea: Secondary | ICD-10-CM | POA: Diagnosis not present

## 2022-12-06 ENCOUNTER — Encounter: Payer: Self-pay | Admitting: Internal Medicine

## 2023-01-03 ENCOUNTER — Ambulatory Visit: Payer: Medicare Other | Admitting: Family Medicine

## 2023-01-03 DIAGNOSIS — H524 Presbyopia: Secondary | ICD-10-CM | POA: Diagnosis not present

## 2023-03-19 ENCOUNTER — Ambulatory Visit (INDEPENDENT_AMBULATORY_CARE_PROVIDER_SITE_OTHER): Payer: Medicare Other | Admitting: Family Medicine

## 2023-03-19 ENCOUNTER — Encounter: Payer: Self-pay | Admitting: Family Medicine

## 2023-03-19 VITALS — BP 130/70 | HR 76 | Ht 61.0 in | Wt 106.6 lb

## 2023-03-19 DIAGNOSIS — H6121 Impacted cerumen, right ear: Secondary | ICD-10-CM

## 2023-03-19 DIAGNOSIS — J302 Other seasonal allergic rhinitis: Secondary | ICD-10-CM | POA: Diagnosis not present

## 2023-03-19 NOTE — Progress Notes (Signed)
Chief Complaint  Patient presents with   Ear Fullness    Right ear fullness and is making her dizzy. Has been using Debrox.    She has been bothered with allergies, and some dizziness for a week or two. She feels a little off-balanced. She has been using flonase and neti-pot.  She has had a cough, using children's dimetapp at night, which helps with the cough. She hasn't been taking any antihistamines, didn't want to feel too dried out. She has been using Netipot--all mucus is clear.  She noted decreased hearing in R ear since last week.  If she pulls the lobe down, it can pop some and hear a little better.  She has a history of cerumen impaction, last time she had issues was in June 2023.  PMH, PSH, SH reviewed  Outpatient Encounter Medications as of 03/19/2023  Medication Sig Note   acetaminophen (TYLENOL) 500 MG tablet Take 500 mg by mouth every 6 (six) hours as needed. 03/19/2023: Took last night   Azelaic Acid (FINACEA EX) Apply topically.     b complex vitamins capsule Take 1 capsule by mouth daily.     calcium carbonate 1250 MG capsule Take 1,250 mg by mouth daily.    cholecalciferol (VITAMIN D) 1000 UNITS tablet Take 1,000 Units by mouth daily.     MAGNESIUM GLYCINATE PO Take 1 tablet by mouth in the morning, at noon, and at bedtime.    Multiple Vitamins-Minerals (MULTIVITAMIN WITH MINERALS) tablet Take 1 tablet by mouth daily.     olopatadine (PATANOL) 0.1 % ophthalmic solution Place 1 drop into both eyes daily as needed for allergies.    pantoprazole (PROTONIX) 40 MG tablet Take 1 tablet by mouth 2 (two) times daily.    vitamin E 1000 UNIT capsule Take 1,000 Units by mouth daily.     [DISCONTINUED] diazepam (VALIUM) 5 MG tablet Take 0.5-1 tablets (2.5-5 mg total) by mouth at bedtime as needed for anxiety. (Patient not taking: Reported on 03/19/2023) 06/26/2022: Took one last night   No facility-administered encounter medications on file as of 03/19/2023.   Allergies  Allergen  Reactions   Tramadol Other (See Comments)    confusion   Bactrim [Sulfamethoxazole-Trimethoprim] Hives   ROS: no f/c, n/v/d, sinus pain, cough, shortness of breath. She otherwise feels well.  Congestion, slightly off-balance, and decreased hearing per HPI   PHYSICAL EXAM:  BP 130/70   Pulse 76   Ht  (1.549 m)   Wt 106 lb 9.6 oz (48.4 kg)   BMI 20.14 kg/m   Wt Readings from Last 3 Encounters:  03/19/23 106 lb 9.6 oz (48.4 kg)  06/26/22 104 lb (47.2 kg)  06/12/22 105 lb 3.2 oz (47.7 kg)   Well-appearing, pleasant female, in no distress HEENT: conjunctiva and sclera are clear, EOMI. L TM and EAC normal R TM is obscured by cerumen; re-eval after lavage, TM is entirely normal. Nasal mucosa is moderately edematous on the left, no erythema or purulence. OP is clear. Sinuses nontender Neck: no lymphadenopathy, thyromegaly or mass Heart: regular rate and rhythm Lungs: clear bilaterally Neuro: alert and oriented, cranial nerves grossly intact, normal gait Psych: normal mood, affect, hygiene and grooming   ASSESSMENT/PLAN:  Hearing loss due to cerumen impaction, right - hearing improved after ear lavage.  Discussed ear eax, answered questions  Seasonal allergies - recommended antihistamine in addition to nasal steroid. Cont mucinex and sinus rinses as needed. Reviewed s/sx of infection

## 2023-06-22 ENCOUNTER — Telehealth: Payer: Self-pay | Admitting: Family Medicine

## 2023-06-22 DIAGNOSIS — Z5181 Encounter for therapeutic drug level monitoring: Secondary | ICD-10-CM

## 2023-06-22 DIAGNOSIS — R7303 Prediabetes: Secondary | ICD-10-CM

## 2023-06-22 DIAGNOSIS — E7800 Pure hypercholesterolemia, unspecified: Secondary | ICD-10-CM

## 2023-06-22 NOTE — Telephone Encounter (Signed)
done

## 2023-06-22 NOTE — Telephone Encounter (Signed)
Orders entered

## 2023-06-22 NOTE — Telephone Encounter (Signed)
Pt has CPE on 8/1 & wants to come in 7/31 for labs, please put in orders

## 2023-06-27 ENCOUNTER — Encounter: Payer: Self-pay | Admitting: *Deleted

## 2023-06-27 ENCOUNTER — Other Ambulatory Visit: Payer: Medicare Other

## 2023-06-27 DIAGNOSIS — Z5181 Encounter for therapeutic drug level monitoring: Secondary | ICD-10-CM | POA: Diagnosis not present

## 2023-06-27 DIAGNOSIS — R7303 Prediabetes: Secondary | ICD-10-CM

## 2023-06-27 DIAGNOSIS — E7800 Pure hypercholesterolemia, unspecified: Secondary | ICD-10-CM | POA: Diagnosis not present

## 2023-06-27 LAB — HEMOGLOBIN A1C
Est. average glucose Bld gHb Est-mCnc: 120 mg/dL
Hgb A1c MFr Bld: 5.8 % — ABNORMAL HIGH (ref 4.8–5.6)

## 2023-06-27 LAB — LIPID PANEL
Chol/HDL Ratio: 2.7 ratio (ref 0.0–4.4)
Cholesterol, Total: 225 mg/dL — ABNORMAL HIGH (ref 100–199)
HDL: 83 mg/dL (ref >39)
LDL Chol Calc (NIH): 134 mg/dL — ABNORMAL HIGH (ref 0–99)
Triglycerides: 46 mg/dL (ref 0–149)
VLDL Cholesterol Cal: 8 mg/dL (ref 5–40)

## 2023-06-27 LAB — COMPREHENSIVE METABOLIC PANEL
ALT: 18 IU/L (ref 0–32)
AST: 21 IU/L (ref 0–40)
Albumin: 4.5 g/dL (ref 3.8–4.8)
Alkaline Phosphatase: 99 IU/L (ref 44–121)
BUN/Creatinine Ratio: 15 (ref 12–28)
BUN: 11 mg/dL (ref 8–27)
Bilirubin Total: 0.6 mg/dL (ref 0.0–1.2)
CO2: 23 mmol/L (ref 20–29)
Calcium: 9.8 mg/dL (ref 8.7–10.3)
Chloride: 92 mmol/L — ABNORMAL LOW (ref 96–106)
Creatinine, Ser: 0.72 mg/dL (ref 0.57–1.00)
Globulin, Total: 3.2 g/dL (ref 1.5–4.5)
Glucose: 95 mg/dL (ref 70–99)
Potassium: 4.6 mmol/L (ref 3.5–5.2)
Sodium: 131 mmol/L — ABNORMAL LOW (ref 134–144)
Total Protein: 7.7 g/dL (ref 6.0–8.5)
eGFR: 87 mL/min/{1.73_m2} (ref >59)

## 2023-06-27 LAB — CBC WITH DIFFERENTIAL/PLATELET
Basophils Absolute: 0.1 10*3/uL (ref 0.0–0.2)
Basos: 1 %
EOS (ABSOLUTE): 0.6 10*3/uL — ABNORMAL HIGH (ref 0.0–0.4)
Eos: 11 %
Hematocrit: 39.8 % (ref 34.0–46.6)
Hemoglobin: 13.5 g/dL (ref 11.1–15.9)
Immature Grans (Abs): 0 10*3/uL (ref 0.0–0.1)
Immature Granulocytes: 0 %
Lymphocytes Absolute: 1.8 10*3/uL (ref 0.7–3.1)
Lymphs: 33 %
MCH: 30.9 pg (ref 26.6–33.0)
MCHC: 33.9 g/dL (ref 31.5–35.7)
MCV: 91 fL (ref 79–97)
Monocytes Absolute: 0.6 10*3/uL (ref 0.1–0.9)
Monocytes: 10 %
Neutrophils Absolute: 2.4 10*3/uL (ref 1.4–7.0)
Neutrophils: 45 %
Platelets: 387 10*3/uL (ref 150–450)
RBC: 4.37 x10E6/uL (ref 3.77–5.28)
RDW: 11.9 % (ref 11.7–15.4)
WBC: 5.5 10*3/uL (ref 3.4–10.8)

## 2023-06-27 LAB — MAGNESIUM

## 2023-06-27 NOTE — Patient Instructions (Incomplete)
  HEALTH MAINTENANCE RECOMMENDATIONS:  It is recommended that you get at least 30 minutes of aerobic exercise at least 5 days/week (for weight loss, you may need as much as 60-90 minutes). This can be any activity that gets your heart rate up. This can be divided in 10-15 minute intervals if needed, but try and build up your endurance at least once a week.  Weight bearing exercise is also recommended twice weekly.  Eat a healthy diet with lots of vegetables, fruits and fiber.  "Colorful" foods have a lot of vitamins (ie green vegetables, tomatoes, red peppers, etc).  Limit sweet tea, regular sodas and alcoholic beverages, all of which has a lot of calories and sugar.  Up to 1 alcoholic drink daily may be beneficial for women (unless trying to lose weight, watch sugars).  Drink a lot of water.  Calcium recommendations are 1200-1500 mg daily (1500 mg for postmenopausal women or women without ovaries), and vitamin D 1000 IU daily.  This should be obtained from diet and/or supplements (vitamins), and calcium should not be taken all at once, but in divided doses.  Monthly self breast exams and yearly mammograms for women over the age of 65 is recommended.  Sunscreen of at least SPF 30 should be used on all sun-exposed parts of the skin when outside between the hours of 10 am and 4 pm (not just when at beach or pool, but even with exercise, golf, tennis, and yard work!)  Use a sunscreen that says "broad spectrum" so it covers both UVA and UVB rays, and make sure to reapply every 1-2 hours.  Remember to change the batteries in your smoke detectors when changing your clock times in the spring and fall. Carbon monoxide detectors are recommended for your home.  Use your seat belt every time you are in a car, and please drive safely and not be distracted with cell phones and texting while driving.  Please get high dose flu shot in the Fall. The newly updated COVID booster is recommended, when it becomes  available in the Fall. RSV vaccine is recommended. You will need to get this from the pharmacy in the Fall.  Tetanus booster (TdaP) is past due. Please get this from the pharmacy.   We briefly discussed osteoporosis medications including Evenity, Prolia and the bisphosphonates (weekly alendronate, monthly Actonel or Boniva, or once-yearly influsion of Reclast). I highly recommend that you consider one of these medications, so that we can PREVENT compression fractures in your spine and hip fractures. If you aren't comfortable making this decision, and prefer to discuss with a specialist, I can refer you to an endocrinologist for further discussion.  It is important that you ensure that you get 1500 mg of calcium daily (between your diet and supplements), daily vitamin D, and weight-bearing exercise at least 2x/week.  I wouldn't use the back brace all the time--fine to use periodically, if it helps. Lumbar support pillows can help keep your posture better while seated. More importantly, work on core strengthening, to keep the muscles strong, to help prevent problems.  Please bring your pink MOST form to your visit for [place] to review and sign.

## 2023-06-27 NOTE — Progress Notes (Unsigned)
No chief complaint on file.  Elizabeth Haynes is a 77 year old female who presents for her yearly physical exam, and follow up on chronic problems. See below for labs done prior to visit..   This was supposed to be done in February, but she cancelled that appointment. She is actually due again now for Medicare Annual Wellness Visit, but her insurance doesn't allow these to be done the same day.  She was last seen in April for cerumen impaction of R ear.   She currently denies any ear complaints, decreased hearing.   Osteoporosis:  Previously took Fosamax for about 5 years (stopped around 2008).  She was put back on fosamax by her GYN after further decline in bone density noted (T went from -2.4 to -3.2 in 2017).  She stopped it herself after a year.  Last DEXA was in 10/2022,  T-3.5 at spine, -2.9 at L fem neck. Prior DEXA was 09/2020. T-3.2 at spine, and -2.5 at L femoral neck.   She has previously declined Prolia, Evenity and bisphosphonates. She is taking calcium and D supplements twice daily, plus separate D daily, and eats plenty of calcium-rich foods (yogurt, cottage cheese). Weight-bearing exercise 2x/week (once with trainer and once on her ow   Neuropathy:  She previously saw neurologist for paresthesias in her feet, and also had reported some back pain. Described the numbness at prior visit as "like my feet are made out of sponges", no tingling.  This is across the metatarsal heads and on the bottom of the lateral aspect of the foot. It is still present, but not terribly bothersome. It affects her balance some. Denies pain or any worsening. Denies any falls.  She had normal EMG/NCV, MRI of lumbar spine showed only slight degenerative changes. Due to some exam findings (brisk UE DTR's, bilat Babinski), to consider poss c-spine MRI if any worsening symptoms. As part of work-up, she was found to have A1c of 5.9 in 06/2021. Last check was 5.8% in 05/2022. She avoids sugar, sweets. She eats brown  rice, whole grain bread, some potatoes.   H/o hyperlipidemia, improved with dietary changes.  It was higher last year, but no reported change in diet. She has excellent HDL. She doesn't eat red meat.  Some cheese (lactose-free cottage cheese, very small portions).  Cooks with olive oil.  Uses almondmilk.  Has some egg whites, infrequently, no longer has egg yolks.   Avoids creamy dressings, soups, ice cream   LDL was significantly higher in 11/2021, up to 170 when she had been eating more eggs. LDL on prior check. LDL improved to 120 on last check in 05/2022.  Component Ref Range & Units 1 yr ago (06/26/22) 1 yr ago (12/14/21) 2 yr ago (12/08/20) 4 yr ago (07/08/18) 8 yr ago (04/01/15) 9 yr ago (10/01/13)  Cholesterol, Total 100 - 199 mg/dL 846 High  962 High  952 High  223 High     Triglycerides 0 - 149 mg/dL 52 56 38 35 62 R 73 R  HDL >39 mg/dL 89 95 80 96 841 R, CM 77  VLDL Cholesterol Cal 5 - 40 mg/dL 9 8 6 7     LDL Chol Calc (NIH) 0 - 99 mg/dL 324 High  401 High  027 High      Chol/HDL Ratio 0.0 - 4.4 ratio 2.4 2.9 CM 2.9 CM 2.3 CM 2.1 R 2.9 R    h/o duodenal ulcer with bleed.  She underwent f/u EGD in 12/2020 with Dr.  Marca Ancona.  This showed mild-mod reflux, mild esophagitis and esophageal dysmotility (related to reflux). The ulcers had healed, there was mild scarring. She continues to take protonix twice daily.  She denies any reflux symptoms (but she never had any). She denies any abdominal pain or dysphagia.  She had +Cologuard in 11/2021.  She saw GI in follow-up; pt declined colonoscopy, insurance wouldn't pay to repeat Cologuard. She had negative FOBT FIT test.  Planning to repeat Cologuard in 11/2024. She felt like it was a false positive related to some red quinoa she had eaten.  She denies any bowel changes, melena or hematochezia, denies abdominal pain.  Immunization History  Administered Date(s) Administered   Fluad Quad(high Dose 65+) 09/02/2020   Influenza Split  08/11/2013, 08/16/2015   Influenza, High Dose Seasonal PF 09/26/2018, 09/27/2021   PFIZER(Purple Top)SARS-COV-2 Vaccination 02/10/2020, 03/02/2020, 09/17/2020   Pneumococcal Conjugate-13 04/01/2015   Pneumococcal Polysaccharide-23 07/15/2012, 12/14/2021   Tdap 07/15/2012   Zoster Recombinant(Shingrix) 04/02/2017, 06/02/2017   Zoster, Live 11/10/2009   She previously declined any further COVID vaccines/boosters. Last Pap smear: here 03/2015, negative, with no high risk HPV. She sees Dr. Algie Coffer, (no pap done since then). Patient prefers not do get any more done. Last mammogram:  09/2021 Last colonoscopy: never. Had negative Cologuard in 11/2018, +Cologuard in 11/2021, referred to [facility], no records received. Pt declined colonoscopy. Last DEXA: 09/2020 T-3.2 spine, -2.5 L fem neck. Dentist: twice yearly Ophtho: yearly Exercise: walks the dog for about an hour every day. Weight training 2x/week (once with a trainer, once on her own, with 5 and 10# weights).    Vitamin D screen: 76.8 in 06/2018  Patient Care Team: Joselyn Arrow, MD as PCP - General (Family Medicine) GYN: Dr. Ernestina Penna (no longer sees) Dentist: Dr. Elder Cyphers Ophtho: Dr. Manning Charity Derm: Dr. Venancio Poisson (prn only) GI: Dr. Marca Ancona Deboraha Sprang) PT: Ronda Fairly  Depression Screening: Flowsheet Row Office Visit from 06/26/2022 in [facility]  PHQ-2 Total Score 0        Falls screen:     03/19/2023    9:18 AM 06/26/2022    2:12 PM 12/14/2021    8:38 AM 02/17/2021    1:52 PM 11/25/2020    2:29 PM  Fall Risk   Falls in the past year? 0 0 0 1 1  Number falls in past yr: 0 0 0 0 0  Injury with Fall? 0 0 0 1 1  Comment    broken tailbone sacral fx  Risk for fall due to : No Fall Risks No Fall Risks No Fall Risks    Follow up Falls evaluation completed Falls evaluation completed Falls evaluation completed       She has healthcare power of attorney and Living Will, in chart.   PMH, PSH, SH and FH were  reviewed and updated.      ROS:  The patient denies anorexia, fever, weight changes, headaches,  vision changes, decreased hearing, ear pain, sore throat, breast concerns, chest pain, palpitations, dizziness, syncope, dyspnea on exertion, cough, swelling, nausea, vomiting, diarrhea, constipation, abdominal pain, melena, hematochezia, indigestion/heartburn, hematuria, incontinence, dysuria, vaginal bleeding, discharge, odor or itch, genital lesions, joint pains, numbness, tingling, weakness, tremor, suspicious skin lesions, depression, anxiety, abnormal bleeding/bruising, or enlarged lymph nodes.   Numbness on the outer part and toes of both feet (neuropathy) unchanged, tolerable. Anxiety     PHYSICAL EXAM:  There were no vitals taken for this visit.   Wt Readings from Last 3 Encounters:  03/19/23 106 lb 9.6 oz (48.4 kg)  06/26/22 104 lb (47.2 kg)  06/12/22 105 lb 3.2 oz (47.7 kg)   General Appearance:     Alert, cooperative, no distress, appears stated age.   Head:     Normocephalic, without obvious abnormality, atraumatic   Eyes:     PERRL, conjunctiva/corneas clear, EOM's intact, fundi benign   Ears:     Bilateral cerumen impaction noted. UPDATE ***  Nose:    No drainage or sinus tenderness  Throat:    Normal mucosa, no lesions  Neck:    Supple, no lymphadenopathy;  thyroid:  no enlargement/ tenderness/ nodules; no carotid bruit or JVD   Back:     Spine nontender, no curvature, ROM normal, no CVA tenderness   Lungs:      Clear to auscultation bilaterally without wheezes, rales or ronchi; respirations unlabored   Chest Wall:     No tenderness or deformity    Heart:     Regular rate and rhythm, S1 and S2 normal, no murmur, rub or gallop   Breast Exam:    No nipple discharge, inversion, breast masses or axillary lymphadenopathy. Nipples appear different--R is normal.  The L has a flat portion at 10 o'clock/upper portion, unchanged per pt  Abdomen:      Soft, non-tender, nondistended,  normoactive bowel sounds, no masses, no hepatosplenomegaly   Genitalia:    Declined today  Rectal:    Declined today  Extremities:    No clubbing, cyanosis or edema. Bunion deformities bilaterally  Pulses:    2+ and symmetric all extremities   Skin:    Skin color, texture, turgor normal. Many large SK's scattered around trunk, arms. Purpura noted on L hand  Lymph nodes:    Cervical, supraclavicular, inguinal and axillary nodes normal   Neurologic:    Normal strength, sensation and gait; reflexes 2+ and symmetric throughout.                              Psych:   Normal mood, affect, hygiene and grooming   UPDATE--cerumen?? PELVIC??? ***  Lab Results  Component Value Date   HGBA1C 5.8 (H) 06/26/2022   Lab Results  Component Value Date   WBC 5.3 12/14/2021   HGB 13.0 12/14/2021   HCT 38.5 12/14/2021   MCV 90 12/14/2021   PLT 339 12/14/2021     Chemistry      Component Value Date/Time   NA 135 12/14/2021 0938   K 4.5 12/14/2021 0938   CL 95 (L) 12/14/2021 0938   CO2 23 12/14/2021 0938   BUN 12 12/14/2021 0938   CREATININE 0.74 12/14/2021 0938   CREATININE 0.70 04/01/2015 0916      Component Value Date/Time   CALCIUM 10.0 12/14/2021 0938   ALKPHOS 84 12/14/2021 0938   AST 32 12/14/2021 0938   ALT 26 12/14/2021 0938   BILITOT 0.7 12/14/2021 0938     Fasting glucose Mg  Lab Results  Component Value Date   CHOL 218 (H) 06/26/2022   HDL 89 06/26/2022   LDLCALC 120 (H) 06/26/2022   TRIG 52 06/26/2022   CHOLHDL 2.4 06/26/2022    ASSESSMENT/PLAN:  DOING CPE TODAY--SHE CANCELLED THIS IN 12/2022; DUE FOR AWV, BUT WILL NEED TO R/S THIS. Do fall and depression screen.  Hold off on other tests until AWV.  Did she ever get Tdap from pharmacy? Flu shot? RSV?  I did breast exam in  11/2021, she refused pelvic/rectal. Please encourage her to have pelvic exam today (and rectal)   Worsening osteoporosis--discuss Counseled in detail regarding lack of treatment for  osteoporosis as well, the risk for fracture (spine and hip).  Discussed monthly self breast exams and yearly mammograms; at least 30 minutes of aerobic activity at least 5 days/week, weight bearing exercise at least 2x/week; proper sunscreen use reviewed; healthy diet, including goals of calcium and vitamin D intake and alcohol recommendations (less than or equal to 1 drink/day) reviewed; regular seatbelt use; changing batteries in smoke detectors.  Immunization recommendations discussed--continue yearly high dose flu shots. Updated bivalent COVID booster recommended when available in the Fall.  RSV vaccine is recommended in the Fall. Tdap is past due, reminded to get from the pharmacy. Colon cancer screening reviewed--had abnormal Cologuard in January, negative f/u FIT test (declined colonoscopy).  Plans to recheck cologuard in 2026.   DNR--she still has proper forms at home. MOST form  was brought from home, reviewed and signed. No changes. UPDATE ***  F/u AWV in 3-6 months

## 2023-06-28 ENCOUNTER — Ambulatory Visit (INDEPENDENT_AMBULATORY_CARE_PROVIDER_SITE_OTHER): Payer: Medicare Other | Admitting: Family Medicine

## 2023-06-28 ENCOUNTER — Encounter: Payer: Self-pay | Admitting: Family Medicine

## 2023-06-28 VITALS — BP 114/70 | HR 60 | Ht 61.0 in | Wt 105.0 lb

## 2023-06-28 DIAGNOSIS — J302 Other seasonal allergic rhinitis: Secondary | ICD-10-CM | POA: Diagnosis not present

## 2023-06-28 DIAGNOSIS — Z0000 Encounter for general adult medical examination without abnormal findings: Secondary | ICD-10-CM | POA: Diagnosis not present

## 2023-06-28 DIAGNOSIS — R7303 Prediabetes: Secondary | ICD-10-CM | POA: Diagnosis not present

## 2023-06-28 DIAGNOSIS — E7800 Pure hypercholesterolemia, unspecified: Secondary | ICD-10-CM | POA: Diagnosis not present

## 2023-06-28 DIAGNOSIS — Z8719 Personal history of other diseases of the digestive system: Secondary | ICD-10-CM

## 2023-06-28 DIAGNOSIS — M810 Age-related osteoporosis without current pathological fracture: Secondary | ICD-10-CM | POA: Diagnosis not present

## 2023-06-28 NOTE — Assessment & Plan Note (Signed)
This remains improved with dietary changes (lower since she cut out egg yolks).  Continue lowfat, low cholesterol diet.

## 2023-06-28 NOTE — Assessment & Plan Note (Signed)
There has been further decline in bone density. T-3.5 at spine, -2.9 at L fem neck (decreased from -3.2 and -2.5 respectively, in 09/2020).  Discussed natural course of further decline, and continued increasing fracture risk. Discussed consequences of possible fracture (spinal compression and hip fracture). Reviewed again bisphosphonates (including length of treatments, weekly/monthly/yearly options), Evenity (my preferred treatment, to be followed by Prolia), and Prolia. We discussed goals of treatment as being decreased fracture risk.  We discussed the importance of calcium, vitamin D and weight-bearing exercise.  Encouraged further increase in muscle mass. Given the names of the recommended treatments (stressing that Evenity if my first choice, if affordable), so that she can further investigate, given her hesitancy.  Also offered referral to endocrinologist to further address her concerns and discuss options.

## 2023-06-28 NOTE — Assessment & Plan Note (Signed)
Stable. Encouraged continued low carb diet, regular exercise

## 2023-06-28 NOTE — Assessment & Plan Note (Signed)
Continue PPI. Continue to avoid NSAIDs

## 2023-06-28 NOTE — Assessment & Plan Note (Signed)
Currently with some PND. Can use antihistamines, mucinex prn.

## 2023-09-10 DIAGNOSIS — L218 Other seborrheic dermatitis: Secondary | ICD-10-CM | POA: Diagnosis not present

## 2023-09-10 DIAGNOSIS — L718 Other rosacea: Secondary | ICD-10-CM | POA: Diagnosis not present

## 2023-09-20 ENCOUNTER — Telehealth: Payer: Self-pay | Admitting: Family Medicine

## 2023-09-20 NOTE — Telephone Encounter (Signed)
Pt is wanting to have colguard She is having problems with constipation and thinks this will tell her what is going on

## 2023-09-20 NOTE — Telephone Encounter (Signed)
No, it won't. This is only a screening test for colon cancer. Please schedule her for an appointment to discuss her concerns.

## 2023-09-20 NOTE — Telephone Encounter (Signed)
Please call   Problems with constipation and

## 2023-09-21 NOTE — Telephone Encounter (Signed)
Left message for pt to call.

## 2023-09-25 NOTE — Progress Notes (Unsigned)
No chief complaint on file.  Patient called 08/2023 asking to have Cologuard. She is having problems with constipation and thinks this will tell her what is going on. She was asked to schedule an appointment to discuss her constipation.  She had +Cologuard in 11/2021, saw her GI, declined colonoscopy. She felt like it was a false + related to some red quinoa she had eaten. She wanted repeat Cologuard, but wouldn't be covered by insurance.  The plan was to repeat Cologuard in 11/2024, when covered.  At that time, she denied any bowel changes, melena, hematochezia. Didn't mention constipation.  Constipation  She has h/o duodenal ulcer with bleed.  She underwent f/u EGD in 12/2020 with Dr. Marca Ancona.  This showed mild-mod reflux, mild esophagitis and esophageal dysmotility (related to reflux). The ulcers had healed, there was mild scarring. She continues to take protonix twice daily.  She denies any reflux symptoms (but she never had any). She denies any abdominal pain or dysphagia.      PMH, PSH, SH reviewed   ROS:    PHYSICAL EXAM:  There were no vitals taken for this visit.  Wt Readings from Last 3 Encounters:  06/28/23 105 lb (47.6 kg)  03/19/23 106 lb 9.6 oz (48.4 kg)  06/26/22 104 lb (47.2 kg)       ASSESSMENT/PLAN:

## 2023-09-26 ENCOUNTER — Ambulatory Visit (INDEPENDENT_AMBULATORY_CARE_PROVIDER_SITE_OTHER): Payer: Medicare Other | Admitting: Family Medicine

## 2023-09-26 ENCOUNTER — Encounter: Payer: Self-pay | Admitting: Family Medicine

## 2023-09-26 VITALS — BP 120/70 | HR 80 | Ht 61.0 in | Wt 101.4 lb

## 2023-09-26 DIAGNOSIS — R195 Other fecal abnormalities: Secondary | ICD-10-CM | POA: Diagnosis not present

## 2023-09-26 DIAGNOSIS — K5900 Constipation, unspecified: Secondary | ICD-10-CM | POA: Diagnosis not present

## 2023-09-26 NOTE — Patient Instructions (Addendum)
Be sure to drink at least 6-8 glasses of water daily. Some people need to try different fiber supplements to see which ones are best tolerated (some can cause gas and bloating, as this one might be for you).  I don't think you need anything that says "prebiotic" Being on an antibiotic long-term for your skin, I think it is a good idea that you take a PRObiotic.  If you don't feel good with a different fiber supplement, stop it.  Try your best to get your adequate fiber intake from your diet (see handout).  And, go back to the Miralax. If you are having too frequent or too loose of stools, then you need to back down further on the dose (ie if you are having diarrhea when taking 1/2 capful every day, back down to every other day).  If you have ongoing issues with constipation, you prbably should see your gastroenterologist,and plan to have a colonoscopy (especially since this was recommended after having an abnormal Cologuard test.

## 2023-10-31 DIAGNOSIS — Z1231 Encounter for screening mammogram for malignant neoplasm of breast: Secondary | ICD-10-CM | POA: Diagnosis not present

## 2023-10-31 LAB — HM MAMMOGRAPHY

## 2023-11-13 ENCOUNTER — Encounter: Payer: Self-pay | Admitting: *Deleted

## 2023-12-03 ENCOUNTER — Encounter: Payer: Self-pay | Admitting: Family Medicine

## 2023-12-03 ENCOUNTER — Ambulatory Visit (INDEPENDENT_AMBULATORY_CARE_PROVIDER_SITE_OTHER): Payer: Medicare Other | Admitting: Family Medicine

## 2023-12-03 VITALS — BP 122/80 | HR 62 | Temp 99.1°F | Wt 103.6 lb

## 2023-12-03 DIAGNOSIS — R059 Cough, unspecified: Secondary | ICD-10-CM

## 2023-12-03 DIAGNOSIS — J069 Acute upper respiratory infection, unspecified: Secondary | ICD-10-CM | POA: Diagnosis not present

## 2023-12-03 LAB — POCT INFLUENZA A/B
Influenza A, POC: NEGATIVE
Influenza B, POC: NEGATIVE

## 2023-12-03 LAB — POC COVID19 BINAXNOW: SARS Coronavirus 2 Ag: NEGATIVE

## 2023-12-03 NOTE — Patient Instructions (Signed)
  Stay well hydrated. Continue the Mucinex DM twice daily. Consider using either an antihistamine (such as claritin, allegra or zyrtec) or a decongestant such as pseudoephedrine, to help dry up the nasal drainage (which will decrease the postnasal drainage and cough).  Contact us  if you have persistent fever, if your nasal mucus or phlegm that you cough up becomes yellow-green/discolored.  You may need an antibiotic at that point. I see no evidence of a bacterial infection today.  Your lungs were clear

## 2023-12-03 NOTE — Progress Notes (Signed)
 Chief Complaint  Patient presents with   other    No fever, no ST, terrible cough, taking Mucinex, no chills, no body aches, AC    12/28 she noticed a cough.  Since that time, the phlegm has gotten thicker. She occasionally notes a yellow tint to it, mostly is clear. She has had some runny nose/congestion.  Nasal drainage is clear. She had some sinus pressure early on, resolved.  Never felt achey, fatigued (just the other night, when coughing kept her up). Denies chest tightness, shortness of breath.  She has been taking Mucinex DM 12 hour maximum strength twice daily for the last 2-3 days, which seems to be helping. She is feeling a little better, since taking the mucinex. Sleeping better, energy is better. She was able to walk today.  No known sick contacts  PMH, PSH, SH reviewed  Outpatient Encounter Medications as of 12/03/2023  Medication Sig Note   ampicillin (PRINCIPEN) 500 MG capsule Take 500 mg by mouth daily. 12/03/2023: Takes daily for rosacea from Dr. Dan Jones   Azelaic Acid (FINACEA EX) Apply topically.     b complex vitamins capsule Take 1 capsule by mouth daily.     calcium  carbonate 1250 MG capsule Take 1,250 mg by mouth daily. 09/26/2023: Has some magnesium in it   cholecalciferol  (VITAMIN D ) 1000 UNITS tablet Take 1,000 Units by mouth daily.     Dextromethorphan-guaiFENesin (MUCINEX DM MAXIMUM STRENGTH) 60-1200 MG TB12 Take 1 tablet by mouth in the morning and at bedtime.    MAGNESIUM GLYCINATE PO Take 1 tablet by mouth daily. 09/26/2023: Stopped after last visit   Multiple Vitamins-Minerals (MULTIVITAMIN WITH MINERALS) tablet Take 1 tablet by mouth daily.     pantoprazole  (PROTONIX ) 40 MG tablet Take 1 tablet by mouth 2 (two) times daily.    vitamin E  1000 UNIT capsule Take 1,000 Units by mouth daily.     acetaminophen  (TYLENOL ) 500 MG tablet Take 500 mg by mouth every 6 (six) hours as needed. (Patient not taking: Reported on 12/03/2023) 12/03/2023: Prn took some couple of  days ago   olopatadine  (PATANOL) 0.1 % ophthalmic solution Place 1 drop into both eyes daily as needed for allergies. (Patient not taking: Reported on 12/03/2023)    Wheat Dextrin (BENEFIBER DRINK MIX PO) Take 10 mLs by mouth in the morning, at noon, and at bedtime. (Patient not taking: Reported on 12/03/2023)    No facility-administered encounter medications on file as of 12/03/2023.   Allergies  Allergen Reactions   Tramadol  Other (See Comments)    confusion   Bactrim [Sulfamethoxazole-Trimethoprim] Hives    ROS: no fever, chills. URI symptoms per HPI. No n/v/d, no rashes. No chest pain, shortness of breath, edema. Moods are good. Denies pain or any other concerns.     PHYSICAL EXAM:  BP 122/80   Pulse 62   Temp 99.1 F (37.3 C)   Wt 103 lb 9.6 oz (47 kg)   SpO2 98%   BMI 19.58 kg/m   Well-appearing, pleasant female in good spirits. Speaking comfortably. Occasional wet-sounding cough during visit, and voice sounds nasal. Nasal mucosa is mildly edematous with clear-white mucus. OP is clear, slightly erythema posteriorly only.  Sinuses are nontender TM's and EAC's are normal. Neck; no lymphadenopathy or mass Heart: regular rate and rhythm, no murmur Lungs: clear bilaterally, no wheezes, rales, ronchi, good air movement,  COVID and influenza tests negative   ASSESSMENT/PLAN:  Viral upper respiratory illness - no e/o bacterial infection on exam, reassured.  Reviewed s/sx for which she should f/u or contact us , and discussed supportive measures  Cough, unspecified type - Plan: POC COVID-19, Influenza A/B   Stay well hydrated. Continue the Mucinex DM twice daily. Consider using either an antihistamine (such as claritin, allegra or zyrtec) or a decongestant such as pseudoephedrine, to help dry up the nasal drainage (which will decrease the postnasal drainage and cough).  Contact us  if you have persistent fever, if your nasal mucus or phlegm that you cough up becomes  yellow-green/discolored.  You may need an antibiotic at that point. I see no evidence of a bacterial infection today.  Your lungs were clear

## 2024-01-08 DIAGNOSIS — H524 Presbyopia: Secondary | ICD-10-CM | POA: Diagnosis not present

## 2024-01-30 NOTE — Patient Instructions (Incomplete)
  HEALTH MAINTENANCE RECOMMENDATIONS:  It is recommended that you get at least 30 minutes of aerobic exercise at least 5 days/week (for weight loss, you may need as much as 60-90 minutes). This can be any activity that gets your heart rate up. This can be divided in 10-15 minute intervals if needed, but try and build up your endurance at least once a week.  Weight bearing exercise is also recommended twice weekly.  Eat a healthy diet with lots of vegetables, fruits and fiber.  "Colorful" foods have a lot of vitamins (ie green vegetables, tomatoes, red peppers, etc).  Limit sweet tea, regular sodas and alcoholic beverages, all of which has a lot of calories and sugar.  Up to 1 alcoholic drink daily may be beneficial for women (unless trying to lose weight, watch sugars).  Drink a lot of water.  Calcium recommendations are 1200-1500 mg daily (1500 mg for postmenopausal women or women without ovaries), and vitamin D 1000 IU daily.  This should be obtained from diet and/or supplements (vitamins), and calcium should not be taken all at once, but in divided doses.  Monthly self breast exams and yearly mammograms for women over the age of 2 is recommended.  Sunscreen of at least SPF 30 should be used on all sun-exposed parts of the skin when outside between the hours of 10 am and 4 pm (not just when at beach or pool, but even with exercise, golf, tennis, and yard work!)  Use a sunscreen that says "broad spectrum" so it covers both UVA and UVB rays, and make sure to reapply every 1-2 hours.  Remember to change the batteries in your smoke detectors when changing your clock times in the spring and fall. Carbon monoxide detectors are recommended for your home.  Use your seat belt every time you are in a car, and please drive safely and not be distracted with cell phones and texting while driving.   Ms. Elizabeth Haynes , Thank you for taking time to come for your Medicare Wellness Visit. I appreciate your ongoing  commitment to your health goals. Please review the following plan we discussed and let me know if I can assist you in the future.   This is a list of the screening recommended for you and due dates:  Health Maintenance  Topic Date Due   DTaP/Tdap/Td vaccine (2 - Td or Tdap) 07/15/2022   COVID-19 Vaccine (4 - 2024-25 season) 07/29/2023   Flu Shot  02/25/2024*   Medicare Annual Wellness Visit  01/30/2025   Pneumonia Vaccine  Completed   DEXA scan (bone density measurement)  Completed   Hepatitis C Screening  Completed   Zoster (Shingles) Vaccine  Completed   HPV Vaccine  Aged Out   Cologuard (Stool DNA test)  Discontinued  *Topic was postponed. The date shown is not the original due date.   Yearly high dose flu shots are recommended. Consider COVID booster (recommended every 6 months for people 65+) RSV vaccine is recommended--you should either get this now (from the pharmacy), as we are still in RSV season, or get in the Fall, prior to the next season.  You are past due for your tetanus booster (TdaP).  Please get this from the pharmacy. We will repeat the Cologuard in 11/2024

## 2024-01-30 NOTE — Progress Notes (Unsigned)
 No chief complaint on file.  Elizabeth Haynes is a 78 year old female who presents for Medicare Annual Wellness Visit.  She was seen last in October 2024 to discuss constipation. She had been taking a pre-biotic Benefiber (wheat dextrin) 2 teaspoons TID x 2 weeks. She was having formed stool every 2-3d without straining. She had some bloating and gas.  She had reported rare blood, when passing hard stools.  Prior to [facility], she used Miralax 1/2 cap at bedtime off/on for several months, with intermittent diarrhea and constipation. We had discussed water intake, high fiber diet, supplements, Miralax and probiotics. If ongoing issues, needs to f/u with GI (needs colonoscopy).  Today she reports ***  She had +Cologuard in 11/2021, saw her GI, declined colonoscopy. She felt like it was a false + related to some red quinoa she had eaten. She had negative fecal occult blood test in 01/2022. She wants to repeat Cologuard in 11/2024, when covered (rather than colonoscopy).     She has h/o duodenal ulcer with bleed.  She underwent f/u EGD in 12/2020 with Dr. Marca Ancona.  This showed mild-mod reflux, mild esophagitis and esophageal dysmotility (related to reflux). The ulcers had healed, there was mild scarring. She continues to take protonix twice daily.  She denies any reflux symptoms (but she never had any). She denies any abdominal pain or dysphagia.  Osteoporosis:  Previously took Fosamax for about 5 years (stopped around 2008).  She was put back on fosamax by her GYN after further decline in bone density noted (T went from -2.4 to -3.2 in 2017).  She stopped it herself after a year.  Last DEXA was in 10/2022,  T-3.5 at spine, -2.9 at L fem neck.  Prior DEXA was 09/2020. T-3.2 at spine, and -2.5 at L femoral neck.   She has previously declined Prolia, Evenity and bisphosphonates. She reports that no medications build bone, after watching her mother have hip fractures after treatments with fosamax and hormones.  She  is very skeptical and doesn't want to take these medications. In August 2024 we discussed this in detail, including the natural course of further decline, and continued increasing fracture risk. Discussed consequences of possible fracture (spinal compression and hip fracture). Reviewed again bisphosphonates (including length of treatments, weekly/monthly/yearly options), Evenity (my preferred treatment, to be followed by Prolia), and Prolia. We discussed goals of treatment as being decreased fracture risk.  We discussed the importance of calcium, vitamin D and weight-bearing exercise.  Encouraged further increase in muscle mass. She was given the names of the recommended treatments (stressing that Evenity if my first choice, if affordable), so that she can further investigate, given her hesitancy.  Also offered referral to endocrinologist to further address her concerns and discuss options.  She is taking calcium and D supplements twice daily, plus separate D daily, and eats plenty of calcium-rich foods (yogurt, cottage cheese). Weight-bearing exercise 2x/week (once with trainer and once on her own).   Neuropathy:  She previously saw neurologist for paresthesias in her feet, and also had reported some back pain. Described the numbness at prior visit as "like my feet are made out of sponges", no tingling.  This is across the metatarsal heads and on the bottom of the lateral aspect of the foot. It is still present, but not terribly bothersome. It affects her balance some. Denies pain or any worsening. Denies any falls.    She had normal EMG/NCV, MRI of lumbar spine showed only slight degenerative changes. Due to  some exam findings (brisk UE DTR's, bilat Babinski), to consider poss c-spine MRI if any worsening symptoms. As part of work-up, she was found to have A1c of 5.9 in 06/2021. Last check was 5.8% in 05/2023.  She avoids sugar, sweets. She eats brown rice, whole grain bread, some potatoes. Denies  sugary beverages, drinks water and green tea.   In August she reported having "fatigue" in her lower back when she sits for a long period of time.She had been borrowing a neighbor's back brace. We had discussed bracing not recommended for regular use. She denies pain, radiation, weakness, sciatica.    H/o hyperlipidemia, improved with dietary changes.  LDL was significantly higher in 11/2021, up to 170 when she had been eating more eggs. LDL improved with dietary changes. HDL and ratio have been normal.  She doesn't eat red meat.  Some cheese (lactose-free cottage cheese, very small portions).  Cooks with olive oil. Uses almondmilk.  Has some egg whites, infrequently, no longer has egg yolks.   Avoids creamy dressings, soups, ice cream      Component Ref Range & Units (hover) 7 mo ago 1 yr ago 2 yr ago 3 yr ago 5 yr ago 8 yr ago 10 yr ago  Cholesterol, Total 225 High  218 High  273 High  228 High  223 High     Triglycerides 46 52 56 38 35 62 R 73 R  HDL 83 89 95 80 96 101 R, CM 77  VLDL Cholesterol Cal 8 9 8 6 7     LDL Chol Calc (NIH) 134 High  120 High  170 High  142 High      Chol/HDL Ratio 2.7 2.4 CM 2.9 CM 2.9 CM 2.3 CM 2.1 R 2.9 R      Immunization History  Administered Date(s) Administered   Fluad Quad(high Dose 65+) 09/02/2020   Influenza Split 08/11/2013, 08/16/2015   Influenza, High Dose Seasonal PF 09/26/2018, 09/27/2021   PFIZER(Purple Top)SARS-COV-2 Vaccination 02/10/2020, 03/02/2020, 09/17/2020   Pneumococcal Conjugate-13 04/01/2015   Pneumococcal Polysaccharide-23 07/15/2012, 12/14/2021   Tdap 07/15/2012   Zoster Recombinant(Shingrix) 04/02/2017, 06/02/2017   Zoster, Live 11/10/2009   She previously declined any further COVID vaccines/boosters. Last Pap smear: here 03/2015, negative, with no high risk HPV.. Patient prefers not do get any more done. Last mammogram:  10/2023 Last colonoscopy: never. Had negative Cologuard in 11/2018, +Cologuard in 11/2021, negative  fecal occult blood in 01/2022.  Pt declined colonoscopy. Last DEXA: 10/2022,  T-3.5 at spine, -2.9 at L fem neck. Dentist: twice yearly Ophtho: yearly Exercise:   walks the dog for about an hour every day. Weight training 2x/week (once with a trainer, once on her own, with 5 and 10# weights).    Vitamin D screen: 76.8 in 06/2018   Patient Care Team: Joselyn Arrow, MD as PCP - General (Family Medicine) GYN: Dr. Ernestina Penna (no longer sees) Dentist: Dr. Elder Cyphers Ophtho: Dr. Manning Charity Derm: Dr. Karlyn Agee GI: Dr. Marca Ancona Deboraha Sprang) PT: Ronda Fairly   Depression Screening: Flowsheet Row Office Visit from 06/28/2023 in [facility]  PHQ-2 Total Score 0        Falls screen:     06/28/2023    1:28 PM 03/19/2023    9:18 AM 06/26/2022    2:12 PM 12/14/2021    8:38 AM 02/17/2021    1:52 PM  Fall Risk   Falls in the past year? 0 0 0 0 1  Number falls in past yr: 0  0 0 0 0  Injury with Fall? 0 0 0 0 1  Comment     broken tailbone  Risk for fall due to : No Fall Risks No Fall Risks No Fall Risks No Fall Risks   Follow up Falls evaluation completed Falls evaluation completed Falls evaluation completed Falls evaluation completed      Functional Status Survey:        She has healthcare power of attorney and Living Will, in chart   PMH, PSH, SH and FH were reviewed and updated.    ROS:  The patient denies anorexia, fever, weight changes, headaches,  vision changes, decreased hearing, ear pain, sore throat, breast concerns, chest pain, palpitations, dizziness, syncope, dyspnea on exertion, cough, swelling, nausea, vomiting, diarrhea, constipation, abdominal pain, melena, hematochezia, indigestion/heartburn, hematuria, incontinence, dysuria, vaginal bleeding, discharge, odor or itch, genital lesions, joint pains, numbness, tingling, weakness, tremor, suspicious skin lesions, depression, anxiety, abnormal bleeding/bruising, or enlarged lymph nodes.    Numbness on the outer part  and toes of both feet (neuropathy) unchanged, tolerable. Some PND/allergies "Fatigue" in back with prolonged sitting.      PHYSICAL EXAM:  There were no vitals taken for this visit.   Wt Readings from Last 3 Encounters:  12/03/23 103 lb 9.6 oz (47 kg)  09/26/23 101 lb 6.4 oz (46 kg)  06/28/23 105 lb (47.6 kg)   Well-appearing, thin, pleasant female in no distress.] HEENT: conjunctiva and sclera are clear, EOMI.  She is alert, oriented, with normal speech and eye contact. Normal mood, affect, hygiene and grooming. She has normal gait. OP is clear, tongue is normal without lesions.  ***UPDATE ANY EXAM DONE ABDOMEN/heart/lungs   ASSESSMENT/PLAN:  Did she ever get TdaP, flu, COVID or RSV vaccines?   Discussed monthly self breast exams and yearly mammograms; at least 30 minutes of aerobic activity at least 5 days/week, weight bearing exercise at least 2x/week; proper sunscreen use reviewed; healthy diet, including goals of calcium and vitamin D intake and alcohol recommendations (less than or equal to 1 drink/day) reviewed; regular seatbelt use; changing batteries in smoke detectors.  Immunization recommendations discussed--recommended yearly high dose flu shots. COVID booster RSV vaccine  Tdap is past due, reminded to get from the pharmacy. Colon cancer screening reviewed--had abnormal Cologuard in January, negative f/u FIT test (declined colonoscopy).  Plans to recheck cologuard in 11/2024.   DNR--she still has proper forms at home. MOST form was brought from home, reviewed and signed. No changes.  ***  F/u CPE 6 mos, sooner prn.    Medicare Attestation I have personally reviewed: The patient's medical and social history Their use of alcohol, tobacco or illicit drugs Their current medications and supplements The patient's functional ability including ADLs,fall risks, home safety risks, cognitive, and hearing and visual impairment Diet and physical activities Evidence  for depression or mood disorders   The patient's weight, height and BMI have been recorded in the chart.  I have made referrals, counseling, and provided education to the patient based on review of the above and I have provided the patient with a written personalized care plan for preventive services.

## 2024-01-31 ENCOUNTER — Encounter: Payer: Self-pay | Admitting: Family Medicine

## 2024-01-31 ENCOUNTER — Ambulatory Visit: Payer: Medicare Other | Admitting: Family Medicine

## 2024-01-31 VITALS — BP 118/70 | HR 64 | Ht 61.0 in | Wt 104.4 lb

## 2024-01-31 DIAGNOSIS — K2100 Gastro-esophageal reflux disease with esophagitis, without bleeding: Secondary | ICD-10-CM | POA: Diagnosis not present

## 2024-01-31 DIAGNOSIS — E7800 Pure hypercholesterolemia, unspecified: Secondary | ICD-10-CM | POA: Diagnosis not present

## 2024-01-31 DIAGNOSIS — M810 Age-related osteoporosis without current pathological fracture: Secondary | ICD-10-CM | POA: Diagnosis not present

## 2024-01-31 DIAGNOSIS — Z0000 Encounter for general adult medical examination without abnormal findings: Secondary | ICD-10-CM

## 2024-01-31 DIAGNOSIS — R7303 Prediabetes: Secondary | ICD-10-CM | POA: Diagnosis not present

## 2024-02-18 DIAGNOSIS — L309 Dermatitis, unspecified: Secondary | ICD-10-CM | POA: Diagnosis not present

## 2024-02-18 DIAGNOSIS — L718 Other rosacea: Secondary | ICD-10-CM | POA: Diagnosis not present

## 2024-02-18 DIAGNOSIS — L218 Other seborrheic dermatitis: Secondary | ICD-10-CM | POA: Diagnosis not present

## 2024-05-30 DIAGNOSIS — S50861A Insect bite (nonvenomous) of right forearm, initial encounter: Secondary | ICD-10-CM | POA: Diagnosis not present

## 2024-05-30 DIAGNOSIS — W57XXXA Bitten or stung by nonvenomous insect and other nonvenomous arthropods, initial encounter: Secondary | ICD-10-CM | POA: Diagnosis not present

## 2024-07-09 ENCOUNTER — Ambulatory Visit: Payer: Self-pay | Admitting: Family Medicine

## 2024-07-09 ENCOUNTER — Encounter: Payer: Self-pay | Admitting: Family Medicine

## 2024-07-09 ENCOUNTER — Ambulatory Visit: Admitting: Family Medicine

## 2024-07-09 ENCOUNTER — Ambulatory Visit: Payer: Self-pay

## 2024-07-09 ENCOUNTER — Telehealth: Payer: Self-pay

## 2024-07-09 VITALS — BP 136/80 | HR 65 | Temp 99.0°F | Ht 61.0 in | Wt 105.2 lb

## 2024-07-09 DIAGNOSIS — M545 Low back pain, unspecified: Secondary | ICD-10-CM | POA: Diagnosis not present

## 2024-07-09 DIAGNOSIS — J309 Allergic rhinitis, unspecified: Secondary | ICD-10-CM | POA: Diagnosis not present

## 2024-07-09 LAB — POCT URINALYSIS DIP (CLINITEK)
Bilirubin, UA: NEGATIVE
Glucose, UA: NEGATIVE mg/dL
Ketones, POC UA: NEGATIVE mg/dL
Leukocytes, UA: NEGATIVE
Nitrite, UA: NEGATIVE
POC PROTEIN,UA: NEGATIVE
Spec Grav, UA: <=1.005 — AB (ref 1.010–1.025)
Urobilinogen, UA: 0.2 U/dL
pH, UA: 7.5 (ref 5.0–8.0)

## 2024-07-09 NOTE — Telephone Encounter (Signed)
 Pt. Scheduled appt. With Dr. Randol  Copied from CRM [id number]. Topic: Clinical - Medical Advice >> Jul 09, 2024  8:05 AM Tinnie BROCKS wrote: Reason for CRM: Pt sees Norleen Jobs. Desires speaking with him or his nurses about a pulled muscle in her back. She is wondering about prescription strength Tylenol  or any other medication that can help her.

## 2024-07-09 NOTE — Telephone Encounter (Signed)
 FYI Only or Action Required?: FYI only for provider.  Patient was last seen in primary care on 01/31/2024 by Randol Dawes, MD.  Called Nurse Triage reporting Back Pain.  Symptoms began several days ago.  Interventions attempted: OTC medications: tylenol  and muscle cream.  Symptoms are: unchanged.  Triage Disposition: See PCP When Office is Open (Within 3 Days)  Patient/caregiver understands and will follow disposition?: Yes    Copied from CRM #[id number]. Topic: Clinical - Red Word Triage >> Jul 09, 2024  8:24 AM Treva T wrote: Red Word that prompted transfer to Nurse Triage: Received call from patient states she having back pain. Reason for Disposition  [1] MODERATE back pain (e.g., interferes with normal activities) AND [2] present > 3 days  Answer Assessment - Initial Assessment Questions 1. ONSET: When did the pain begin? (e.g., minutes, hours, days)     Sunday, states had a deep cough and heard something pop 2. LOCATION: Where does it hurt? (upper, mid or lower back)     Right side, mid to lower back  3. SEVERITY: How bad is the pain?  (e.g., Scale 1-10; mild, moderate, or severe)     States cannot sleep at night, throbbing and aching 4. PATTERN: Is the pain constant? (e.g., yes, no; constant, intermittent)      Depends on position, laying down is worse 5. RADIATION: Does the pain shoot into your legs or somewhere else?     no 6. CAUSE:  What do you think is causing the back pain?      Pulled muscle 7. BACK OVERUSE:  Any recent lifting of heavy objects, strenuous work or exercise?     Did go to the gym on monday 8. MEDICINES: What have you taken so far for the pain? (e.g., nothing, acetaminophen , NSAIDS)     Tylenol  and muscle cream 9. NEUROLOGIC SYMPTOMS: Do you have any weakness, numbness, or problems with bowel/bladder control?     no 10. OTHER SYMPTOMS: Do you have any other symptoms? (e.g., fever, abdomen pain, burning with urination, blood in  urine)       no 11. PREGNANCY: Is there any chance you are pregnant? When was your last menstrual period?       no  Protocols used: Back Pain-A-AH

## 2024-07-09 NOTE — Patient Instructions (Addendum)
 ACUTE RIGHT-SIDED LOW BACK PAIN: You likely have a muscle strain in your lower back, possibly from a deep cough. -Continue taking Tylenol  as needed, and consider using Tylenol  Arthritis for longer relief. -Stop using Voltaren gel if it is not helping. You can try a non-outdated one to see if it is more effective (topical anti-inflammatory might help). -Use a heating pad for 15-20 minutes before stretching and massaging your back. -Consider using Salonpas with lidocaine  patches  -Try Icy Hot or Biofreeze topically for pain relief (if voltaren isn't helping) -Check the expiration date on your Voltaren gel and consider buying a new one if it is expired. -We will obtain a urine sample to rule out a kidney infection or stone.  If your muscle pain isn't improving, still feels tight, next steps could include physical therapy, or a trial of muscle relaxants.  ALLERGIC RHINITIS: You have symptoms of postnasal drip, throat clearing, and a dry cough, especially at night. -Increase Flonase to two sprays in each nostril daily. Remember to use gentle sniffs. -Start taking Mucinex plain or DM twice daily as discussed (the DM has a cough suppressant, which might be better to use at night, when you are coughing more). If you have plain mucinex at home (single ingredient guaifenesin) you can try that first, and then get the Mucinex DM for bedtime only if your night cough continues. -Continue using Flonase, and consider adding Allegra or other oral antihistamine (zyrtec, claritin) if needed.

## 2024-07-09 NOTE — Progress Notes (Signed)
 Chief Complaint  Patient presents with   Back Pain    Back pain  -pulled muscle in her back.  This happen on Sunday , pt was coughing  heard something popped in back , pt having on  lower  back on right.She is wondering about prescription strength Tylenol  or any other medication that can help her. - pt is taking otc Tylenol  - pain seating , no numbness or tingling  - she stated that it feels like muscle spasms   - pt stated that she has had cough  she stated a few weeks  she isn't taking any for this  - no refills     Elizabeth Haynes is a 78 year old female who presents with right-sided low back pain following a deep cough.  She experiences right-sided low back pain that began after a deep cough on July 06, 2024, accompanied by a 'pop' sound. The pain persists as a spasm, localized to the right lower back, and worsens with sitting and twisting. Tylenol  provides temporary relief, and a heating pad helps, but Voltaren gel offers minimal relief (admits that it is likely expired). She has difficulty sleeping due to the pain.  There are no urinary symptoms such as burning, urgency, frequency, or hematuria. She has no fever or chills, though she had a low-grade fever of 77F today. Nausea occurred last night, possibly due to medications.  She has a history of postnasal drip and a dry, deep cough, especially when lying down at night. Allegra and Claritin have been ineffective, and she occasionally uses Mucinex (not recently). She uses Flonase daily, 1 sniff into each nostril.   PMH, PSH, SH reviewed  Outpatient Encounter Medications as of 07/09/2024  Medication Sig Note   acetaminophen  (TYLENOL ) 500 MG tablet Take 500 mg by mouth every 6 (six) hours as needed. 07/09/2024: As needed  last dose night  1 tablet    ampicillin (PRINCIPEN) 500 MG capsule Take 500 mg by mouth daily. 01/31/2024: Takes daily for rosacea from Dr. Dan Jones     Azelaic Acid (FINACEA EX) Apply topically.     b complex  vitamins capsule Take 1 capsule by mouth daily.     calcium  carbonate 1250 MG capsule Take 1,250 mg by mouth daily. 01/31/2024: Has some magnesium in it     cholecalciferol  (VITAMIN D ) 1000 UNITS tablet Take 1,000 Units by mouth daily.     MAGNESIUM GLYCINATE PO Take 1 tablet by mouth daily.    Multiple Vitamins-Minerals (MULTIVITAMIN WITH MINERALS) tablet Take 1 tablet by mouth daily.     olopatadine  (PATANOL) 0.1 % ophthalmic solution Place 1 drop into both eyes daily as needed for allergies.    pantoprazole  (PROTONIX ) 40 MG tablet Take 1 tablet by mouth 2 (two) times daily.    vitamin B-12 (CYANOCOBALAMIN) 500 MCG tablet Take 500 mcg by mouth daily.    vitamin E  1000 UNIT capsule Take 1,000 Units by mouth daily.     No facility-administered encounter medications on file as of 07/09/2024.   Allergies  Allergen Reactions   Tramadol  Other (See Comments)    confusion   Bactrim [Sulfamethoxazole-Trimethoprim] Hives    ROS:No known f/c, v/d. Mild nausea last night. R back pain per HPI No urinary complaints. No chest pain, shortness of breath. Occasional palpitation. No other joint pains. Moods good. No bleeding, bruising, rash    PHYSICAL EXAM:  BP 136/80   Pulse 65   Temp 99 F (37.2 C)   Ht 5'  1 (1.549 m)   Wt 105 lb 3.2 oz (47.7 kg)   SpO2 98%   BMI 19.88 kg/m   Wt Readings from Last 3 Encounters:  07/09/24 105 lb 3.2 oz (47.7 kg)  01/31/24 104 lb 6.4 oz (47.4 kg)  12/03/23 103 lb 9.6 oz (47 kg)   Pleasant thin female. She needs to stand up during visit, as remaining seated causes R back pain. Occasional dry cough during visit. Conversing easily, in no distress. HEENT: conjunctiva and sclera are clear, EOMI. TM's and EAC's normal. OP clear.  Nasal mucosa with mod edema L>R, no mucus noted. Sinuses nontender. Neck: no lymphadenopathy or mass Heart: regular rate and rhythm, with ectopy noted. Lungs: clear bilaterally Back: no spinal or CVA tenderness. Area of  discomfort is R lumbar paraspinous muscle.  Doesn't feel any tighter than the L side, no spasm.  She has a small focal area of tenderness at inferior, lateral rib on right. Abdomen: soft, nontender, no mass Extremities: no edema, normal pulses Neuro: alert and oriented, cranial nerves grossly intact. Normal strength, gait, DTR's symmetric Skin: normal turgor, no rashes or bruising Psych: normal mood, affect, hygiene and grooming   ASSESSMENT/PLAN:  Acute right-sided low back pain without sciatica - suspect muscular based on history, exam, to check urine. Ddx includes infection, stone. - Plan: POCT URINALYSIS DIP (CLINITEK)  Allergic rhinitis, unspecified seasonality, unspecified trigger - suboptimally controlled with 1 sniff of Flonase alone.  Increase to 2 sniffers, proper use reviewed. Antihistamine daily prn, mucinex prn  Increase flonase to 2 sprays into each nostril, remembering to use gentle sniffs Start mucinex twice daily.  If it only has 1 incredient, guaifenein), and still coughing at night, you can switch to a DM version at bedtime.  Heat stretches massage   Acute right-sided low back pain without sciatica Muscular strain likely due to paraspinous muscle tenderness. No bony or central pain. Differential includes rib cartilage injury, given small focal area of pain at inferior rib, laterally. - Obtain urine sample to rule out kidney infection or stone. - Continue Tylenol  as needed, consider Tylenol  Arthritis for longer relief. - Discontinue Voltaren gel if ineffective--may be expired, can try new tube - Use heating pad for 15-20 minutes before stretching and massaging. Shown stretches - Consider Salonpas with lidocaine  patches at night. - Try Icy Hot or Biofreeze topically for pain relief.  If your muscle pain isn't improving, still feels tight, next steps could include physical therapy, or a trial of muscle relaxants.  Allergic rhinitis Symptoms include postnasal drip,  throat clearing, and dry cough, especially at night. Flonase used daily. Mucosal swelling noted, no significant mucus or sinus tenderness. - Increase Flonase to two sprays in each nostril daily. - Start Mucinex plain or DM twice daily based on night cough presence. - Continue Flonase, consider adding Allegra if needed.  ADDENDUM:  U/a: SG <= 1.005, moderate blood, rest negative.  No prior hematuria in chart. Advised of potentially indicated kidney stone or other bladder/kidney abnormality. If pain doesn't improve with measures to treat for MSK pain, to contact us  and we can order CT.

## 2024-07-12 ENCOUNTER — Other Ambulatory Visit: Payer: Self-pay | Admitting: Family Medicine

## 2024-07-12 DIAGNOSIS — M545 Low back pain, unspecified: Secondary | ICD-10-CM

## 2024-07-12 MED ORDER — CYCLOBENZAPRINE HCL 5 MG PO TABS: 5.0000 mg | ORAL_TABLET | Freq: Three times a day (TID) | ORAL | 1 refills | Status: DC | PRN

## 2024-07-12 NOTE — Progress Notes (Signed)
 Flexeril   called in to help with the back pain

## 2024-08-06 NOTE — Progress Notes (Unsigned)
 No chief complaint on file.  Elizabeth Haynes is a 78 year old female who presents for annual physical exam.  She was last seen last month with R sided back pain, and some blood was noted in the urine. Advised CT was recommended if pain didn't resolve. She got prescription for flexeril  from Dr. Joyce. ***  Constipation resolved after stopping ampicillin for rosacea. No longer needing fiber supplement, miralax or probiotic.  She had +Cologuard in 11/2021, saw her GI, declined colonoscopy. She felt like it was a false + related to some red quinoa she had eaten. She had negative fecal occult blood test in 01/2022. She wants to repeat Cologuard in 11/2024, when covered (rather than colonoscopy).    She has h/o duodenal ulcer with bleed.  She underwent f/u EGD in 12/2020 with Dr. Saintclair.  This showed mild-mod reflux, mild esophagitis and esophageal dysmotility (related to reflux). The ulcers had healed, there was mild scarring. She continues to take protonix  twice daily.  She denies any reflux symptoms (but she never had any). She denies any abdominal pain or dysphagia.  Osteoporosis:  Previously took Fosamax for about 5 years (stopped around 2008).  She was put back on fosamax by her GYN after further decline in bone density noted (T went from -2.4 to -3.2 in 2017).  She stopped it herself after a year.  Last DEXA was in 10/2022,  T-3.5 at spine, -2.9 at L fem neck.  Prior DEXA was 09/2020. T-3.2 at spine, and -2.5 at L femoral neck.   She has previously declined Prolia, Evenity and bisphosphonates. She reports that no medications build bone, after watching her mother have hip fractures after treatments with fosamax and hormones.  She is very skeptical and doesn't want to take these medications.  In August 2024 we discussed this in detail, including the natural course of further decline, and continued increasing fracture risk. Discussed consequences of possible fracture (spinal compression and hip fracture).  Reviewed again bisphosphonates (including length of treatments, weekly/monthly/yearly options), Evenity (my preferred treatment, to be followed by Prolia), and Prolia. We discussed goals of treatment as being decreased fracture risk.  We discussed the importance of calcium , vitamin D  and weight-bearing exercise.  Encouraged further increase in muscle mass. She was given the names of the recommended treatments (stressing that Evenity if my first choice, if affordable), so that she can further investigate, given her hesitancy.  Also offered referral to endocrinologist to further address her concerns and discuss options, declined.  She is taking calcium  and D supplements twice daily, plus separate D daily, and eats plenty of calcium -rich foods (yogurt, cottage cheese). Weight-bearing exercise 2x/week (once with trainer and once on her own). She is wanting another bone density test in December (despite not agreeing to any treatments).    Neuropathy:  She previously saw neurologist for paresthesias in her feet, and also had reported some back pain. Described the numbness at prior visit as like my feet are made out of sponges, no tingling.  This is across the metatarsal heads and on the bottom of the lateral aspect of the foot. It is still present, but not terribly bothersome.She used to report it affected her balance some. Denies pain or any worsening. Denies any falls.  This remains the same, learned to live with it, not bothersome.  No longer seems to affect her balance, working with trainer helps.   She had normal EMG/NCV, MRI of lumbar spine showed only slight degenerative changes.  As part of work-up,  she was found to have A1c of 5.9 in 06/2021. Last check was 5.8% in 05/2023. Due for recheck today. She avoids sugar, sweets. She eats brown rice, whole grain bread, some potatoes. Denies sugary beverages, drinks water and green tea.    H/o hyperlipidemia, improved with dietary changes.  LDL was  significantly higher in 11/2021, up to 170 when she had been eating more eggs. LDL improved with dietary changes. HDL and ratio have been normal. Due for recheck today.  She doesn't eat red meat.  Some cheese (lactose-free cottage cheese, very small portions).  Cooks with olive oil. Uses almondmilk.  Has some egg whites, infrequently, no longer has egg yolks.   Avoids creamy dressings, soups, ice cream      Component Ref Range & Units (hover) 1 yr ago (06/27/23) 2 yr ago (06/26/22) 2 yr ago (12/14/21) 3 yr ago (12/08/20) 6 yr ago (07/08/18) 9 yr ago (04/01/15) 10 yr ago (10/01/13)  Cholesterol, Total 225 High  218 High  273 High  228 High  223 High     Triglycerides 46 52 56 38 35 62 R 73 R  HDL 83 89 95 80 96 101 R, CM 77  VLDL Cholesterol Cal 8 9 8 6 7     LDL Chol Calc (NIH) 134 High  120 High  170 High  142 High      Chol/HDL Ratio 2.7 2.4 CM 2.9 CM 2.9 CM 2.3 CM 2.1 R 2.9 R     Immunization History  Administered Date(s) Administered   Fluad Quad(high Dose 65+) 09/02/2020   INFLUENZA, HIGH DOSE SEASONAL PF 09/26/2018, 09/27/2021   Influenza Split 08/11/2013, 08/16/2015   PFIZER(Purple Top)SARS-COV-2 Vaccination 02/10/2020, 03/02/2020, 09/17/2020   Pneumococcal Conjugate-13 04/01/2015   Pneumococcal Polysaccharide-23 07/15/2012, 12/14/2021   Tdap 07/15/2012   Zoster Recombinant(Shingrix) 04/02/2017, 06/02/2017   Zoster, Live 11/10/2009   She previously declined any further COVID vaccines/boosters. Last Pap smear: here 03/2015, negative, with no high risk HPV.. Patient prefers not do get any more done. Last mammogram:  10/2023 Last colonoscopy: never. Had negative Cologuard in 11/2018, +Cologuard in 11/2021, negative fecal occult blood in 01/2022.  Pt declined colonoscopy. Last DEXA: 10/2022,  T-3.5 at spine, -2.9 at L fem neck. Dentist: twice yearly Ophtho: yearly Exercise:  walks the dog for about an hour every day. Weight training 2x/week (once with a trainer, once on her own,  with 5 and 10# weights).    Vitamin D  screen: 76.8 in 06/2018    PMH, PSH, SH and FH were reviewed and updated.      ROS:  The patient denies anorexia, fever, weight changes, headaches,  vision changes, decreased hearing, ear pain, sore throat, breast concerns, chest pain, palpitations, dizziness, syncope, dyspnea on exertion, cough, swelling, nausea, vomiting, diarrhea, constipation, abdominal pain, melena, hematochezia, indigestion/heartburn, hematuria, incontinence, dysuria, vaginal bleeding, discharge, odor or itch, genital lesions, joint pains, numbness, tingling, weakness, tremor, suspicious skin lesions, depression, anxiety, abnormal bleeding/bruising, or enlarged lymph nodes.   Sees dermatologist regularly. Numbness on the outer part and toes of both feet (neuropathy) unchanged, tolerable. Some PND/allergies, not currently. Fatigue in back at the end of the day.     PHYSICAL EXAM:  There were no vitals taken for this visit.  Wt Readings from Last 3 Encounters:  07/09/24 105 lb 3.2 oz (47.7 kg)  01/31/24 104 lb 6.4 oz (47.4 kg)  12/03/23 103 lb 9.6 oz (47 kg)   General Appearance:     Alert, cooperative, no distress, appears  stated age. Some throat-clearing noted during visit. ***  Head:     Normocephalic, without obvious abnormality, atraumatic   Eyes:     PERRL, conjunctiva/corneas clear, EOM's intact, fundi benign   Ears:     normal TM's and EACs  Nose:    No drainage or sinus tenderness  Throat:    Normal mucosa, no lesions  Neck:    Supple, no lymphadenopathy;  thyroid :  no enlargement/ tenderness/ nodules; no carotid bruit or JVD   Back:     Spine nontender, no curvature, ROM normal, no CVA tenderness   Lungs:      Clear to auscultation bilaterally without wheezes, rales or ronchi; respirations unlabored   Chest Wall:     No tenderness or deformity    Heart:     Regular rate and rhythm, S1 and S2 normal, no murmur, rub or gallop   Breast Exam:    No nipple  discharge, inversion, breast masses or axillary lymphadenopathy. Nipples appear different--R is normal.  The L has a flat portion at 10 o'clock/upper portion, unchanged per pt  Abdomen:      Soft, non-tender, nondistended, normoactive bowel sounds, no masses, no hepatosplenomegaly   Genitalia:   Normal external genitalia. No lesions, +atrophic changes noted. Normal bimanual exam without uterine or adnexal tenderness or masses. ***  Rectal:   Normal sphincter tone, no mass. External nodule noted on L (outside of anal area), nontender, unchanged per pt. No stool in vault for heme-testing ***  Extremities:    No clubbing, cyanosis or edema. Bunion deformities bilaterally  Pulses:    2+ and symmetric all extremities   Skin:    Skin color, texture, turgor normal. Many large SK's scattered around trunk, arms. Purpura noted on lower legs and forearms.  Lymph nodes:    Cervical, supraclavicular, inguinal and axillary nodes normal   Neurologic:    Normal strength, sensation and gait; reflexes 2+ and symmetric throughout.                   Psych:   Normal mood, affect, hygiene and grooming  ***UPDATE--throat clearing? Update breast/nipples ***UPDATE IF PELVIC/RECTAL done Update purpura on legs/forarms   ASSESSMENT/PLAN:  Flu Prevnar-20  Recheck u/a (had blood in urine when checked for back pain last month)  Did she ever get Tdap or RSV from pharmacy?? Can decline pelvic if not having any problems or issues (changes to the nodule on her rectal area?) Does she already have DEXA scheduled for 10/2024 with [facility]? Order Cologuard--due to be done 11/2024   Discussed monthly self breast exams and yearly mammograms; at least 30 minutes of aerobic activity at least 5 days/week, weight bearing exercise at least 2x/week; proper sunscreen use reviewed; healthy diet, including goals of calcium  and vitamin D  intake and alcohol recommendations (less than or equal to 1 drink/day) reviewed; regular seatbelt use;  changing batteries in smoke detectors.  Immunization recommendations discussed--recommended yearly high dose flu shots, declines. COVID vaccine discussed, declined. RSV vaccine recommended from pharmacy--discussed in detail. Tdap is past due, encouraged her to get from the pharmacy. Consider Prevnar-20 or capvaxive. Colon cancer screening reviewed--had abnormal Cologuard in January, negative f/u FIT test (declined colonoscopy).  Plans to recheck cologuard in 11/2024. Discussed DEXA--I didn't recommend repeating if she wasn't planning to change her mind regarding treatment. She is requesting DEXA 10/2024 [facility]).  She may be willing to see endo for consult to further discuss medications after next DEXA.   F/u AWV 6 mos,  CPE 1 year, sooner prn.

## 2024-08-07 ENCOUNTER — Ambulatory Visit (INDEPENDENT_AMBULATORY_CARE_PROVIDER_SITE_OTHER): Admitting: Family Medicine

## 2024-08-07 ENCOUNTER — Encounter: Payer: Self-pay | Admitting: Family Medicine

## 2024-08-07 VITALS — BP 138/80 | HR 72 | Ht 61.0 in | Wt 104.2 lb

## 2024-08-07 DIAGNOSIS — M810 Age-related osteoporosis without current pathological fracture: Secondary | ICD-10-CM | POA: Diagnosis not present

## 2024-08-07 DIAGNOSIS — R7303 Prediabetes: Secondary | ICD-10-CM | POA: Diagnosis not present

## 2024-08-07 DIAGNOSIS — Z23 Encounter for immunization: Secondary | ICD-10-CM

## 2024-08-07 DIAGNOSIS — E7800 Pure hypercholesterolemia, unspecified: Secondary | ICD-10-CM

## 2024-08-07 DIAGNOSIS — Z0000 Encounter for general adult medical examination without abnormal findings: Secondary | ICD-10-CM

## 2024-08-07 DIAGNOSIS — Z1211 Encounter for screening for malignant neoplasm of colon: Secondary | ICD-10-CM

## 2024-08-07 DIAGNOSIS — R319 Hematuria, unspecified: Secondary | ICD-10-CM

## 2024-08-07 DIAGNOSIS — K2100 Gastro-esophageal reflux disease with esophagitis, without bleeding: Secondary | ICD-10-CM | POA: Diagnosis not present

## 2024-08-07 DIAGNOSIS — Z5181 Encounter for therapeutic drug level monitoring: Secondary | ICD-10-CM | POA: Diagnosis not present

## 2024-08-07 DIAGNOSIS — J302 Other seasonal allergic rhinitis: Secondary | ICD-10-CM

## 2024-08-07 LAB — POCT URINALYSIS DIP (PROADVANTAGE DEVICE)
Bilirubin, UA: NEGATIVE
Glucose, UA: NEGATIVE mg/dL
Ketones, POC UA: NEGATIVE mg/dL
Nitrite, UA: NEGATIVE
Protein Ur, POC: NEGATIVE mg/dL
Specific Gravity, Urine: 1.005
Urobilinogen, Ur: 0.2
pH, UA: 7.5 (ref 5.0–8.0)

## 2024-08-07 LAB — POCT GLYCOSYLATED HEMOGLOBIN (HGB A1C): Hemoglobin A1C: 5.6 % (ref 4.0–5.6)

## 2024-08-07 NOTE — Patient Instructions (Addendum)
  HEALTH MAINTENANCE RECOMMENDATIONS:  It is recommended that you get at least 30 minutes of aerobic exercise at least 5 days/week (for weight loss, you may need as much as 60-90 minutes). This can be any activity that gets your heart rate up. This can be divided in 10-15 minute intervals if needed, but try and build up your endurance at least once a week.  Weight bearing exercise is also recommended twice weekly.  Eat a healthy diet with lots of vegetables, fruits and fiber.  Colorful foods have a lot of vitamins (ie green vegetables, tomatoes, red peppers, etc).  Limit sweet tea, regular sodas and alcoholic beverages, all of which has a lot of calories and sugar.  Up to 1 alcoholic drink daily may be beneficial for women (unless trying to lose weight, watch sugars).  Drink a lot of water.  Calcium  recommendations are 1200-1500 mg daily (1500 mg for postmenopausal women or women without ovaries), and vitamin D  1000 IU daily.  This should be obtained from diet and/or supplements (vitamins), and calcium  should not be taken all at once, but in divided doses.  Monthly self breast exams and yearly mammograms for women over the age of 16 is recommended.  Sunscreen of at least SPF 30 should be used on all sun-exposed parts of the skin when outside between the hours of 10 am and 4 pm (not just when at beach or pool, but even with exercise, golf, tennis, and yard work!)  Use a sunscreen that says broad spectrum so it covers both UVA and UVB rays, and make sure to reapply every 1-2 hours.  Remember to change the batteries in your smoke detectors when changing your clock times in the spring and fall. Carbon monoxide detectors are recommended for your home.  Use your seat belt every time you are in a car, and please drive safely and not be distracted with cell phones and texting while driving.   Yearly high dose flu shots are recommended.  COVID booster is recommended for everyone 65 and over RSV vaccine  is recommended--you should get this from the pharmacy now (recommended for everybody 60 and older, even if healthy)--RSV season is the same as flu season, over the winter. You will not need this yearly, just now.   You are past due for your tetanus booster (TdaP).  Please get this from the pharmacy. This is once every 10 years.   You are also due for another pneumonia vaccine, which should be the last one. Either Prevnar-20 or Capvaxive-21.  We can give Prevnar-20 in our office.  Some pharmacies carry the Capvaxive. They are very similar. You only need one or the other, just once. (No boosters).   We will repeat the Cologuard in 11/2024  Consider adding zyrtec to your allergy regimen. Take it at night in case it makes you sleepy. If you still feel tired during the day, switch to claritin or allegra. Consider wearing a mask when walking outside when pollen counts are high, and doing a sinus rinse when you come in. You can also try adding in some Mucinex (guaifenesin) to help with thick mucus, and throat-clearing from the postnasal drip

## 2024-08-08 ENCOUNTER — Ambulatory Visit: Payer: Self-pay | Admitting: Family Medicine

## 2024-08-08 LAB — COMPREHENSIVE METABOLIC PANEL WITH GFR
ALT: 15 IU/L (ref 0–32)
AST: 22 IU/L (ref 0–40)
Albumin: 4.3 g/dL (ref 3.8–4.8)
Alkaline Phosphatase: 90 IU/L (ref 44–121)
BUN/Creatinine Ratio: 10 — ABNORMAL LOW (ref 12–28)
BUN: 7 mg/dL — ABNORMAL LOW (ref 8–27)
Bilirubin Total: 0.6 mg/dL (ref 0.0–1.2)
CO2: 24 mmol/L (ref 20–29)
Calcium: 9.5 mg/dL (ref 8.7–10.3)
Chloride: 90 mmol/L — ABNORMAL LOW (ref 96–106)
Creatinine, Ser: 0.71 mg/dL (ref 0.57–1.00)
Globulin, Total: 3.1 g/dL (ref 1.5–4.5)
Glucose: 95 mg/dL (ref 70–99)
Potassium: 3.6 mmol/L (ref 3.5–5.2)
Sodium: 129 mmol/L — ABNORMAL LOW (ref 134–144)
Total Protein: 7.4 g/dL (ref 6.0–8.5)
eGFR: 88 mL/min/1.73 (ref >59)

## 2024-08-08 LAB — LIPID PANEL
Chol/HDL Ratio: 2.4 ratio (ref 0.0–4.4)
Cholesterol, Total: 226 mg/dL — ABNORMAL HIGH (ref 100–199)
HDL: 94 mg/dL (ref >39)
LDL Chol Calc (NIH): 123 mg/dL — ABNORMAL HIGH (ref 0–99)
Triglycerides: 50 mg/dL (ref 0–149)
VLDL Cholesterol Cal: 9 mg/dL (ref 5–40)

## 2024-08-11 DIAGNOSIS — M25552 Pain in left hip: Secondary | ICD-10-CM | POA: Diagnosis not present

## 2024-08-11 DIAGNOSIS — M25562 Pain in left knee: Secondary | ICD-10-CM | POA: Diagnosis not present

## 2024-08-20 DIAGNOSIS — L570 Actinic keratosis: Secondary | ICD-10-CM | POA: Diagnosis not present

## 2024-08-20 DIAGNOSIS — L218 Other seborrheic dermatitis: Secondary | ICD-10-CM | POA: Diagnosis not present

## 2024-08-20 DIAGNOSIS — L718 Other rosacea: Secondary | ICD-10-CM | POA: Diagnosis not present

## 2024-11-05 DIAGNOSIS — Z1231 Encounter for screening mammogram for malignant neoplasm of breast: Secondary | ICD-10-CM | POA: Diagnosis not present

## 2024-11-05 LAB — HM MAMMOGRAPHY

## 2024-11-18 ENCOUNTER — Ambulatory Visit: Admitting: Family Medicine

## 2024-11-18 ENCOUNTER — Ambulatory Visit: Payer: Self-pay

## 2024-11-18 VITALS — BP 126/82 | HR 69 | Wt 105.0 lb

## 2024-11-18 DIAGNOSIS — N3000 Acute cystitis without hematuria: Secondary | ICD-10-CM | POA: Diagnosis not present

## 2024-11-18 LAB — POCT URINE DIPSTICK
Blood, UA: NEGATIVE
Glucose, UA: NEGATIVE mg/dL
Nitrite, UA: POSITIVE — AB
POC PROTEIN,UA: NEGATIVE
Spec Grav, UA: <=1.005 — AB
Urobilinogen, UA: 0.2 U/dL
pH, UA: 6

## 2024-11-18 MED ORDER — NITROFURANTOIN MONOHYD MACRO 100 MG PO CAPS: 100.0000 mg | ORAL_CAPSULE | Freq: Two times a day (BID) | ORAL | 0 refills | Status: AC

## 2024-11-18 NOTE — Progress Notes (Signed)
" ° °  Name: Elizabeth Haynes   Date of Visit: 11/18/2024   Date of last visit with me: Visit date not found   CHIEF COMPLAINT:  Chief Complaint  Patient presents with   Acute Visit    Urinary symptoms going on for 1 week with urgency, frequency, and blood in urine. Started taking AZO this morning.        HPI:  Discussed the use of AI scribe software for clinical note transcription with the patient, who gave verbal consent to proceed.  History of Present Illness   Elizabeth Haynes is a 78 year old female who presents with symptoms suggestive of a urinary tract infection.  She experiences urinary frequency and urgency. Initially, she thought she saw blood in her urine, but clarified that she had not actually seen blood.  She has a history of a urinary tract infection years ago but has not experienced one recently. No frequent urinary tract infections.         OBJECTIVE:       08/07/2024    1:30 PM  Depression screen PHQ 2/9  Decreased Interest 0  Down, Depressed, Hopeless 0  PHQ - 2 Score 0     BP Readings from Last 3 Encounters:  11/18/24 126/82  08/07/24 138/80  07/09/24 136/80    BP 126/82   Pulse 69   Wt 105 lb (47.6 kg)   SpO2 98%   BMI 19.84 kg/m    Physical Exam          Physical Exam Constitutional:      Appearance: Normal appearance.  Neurological:     General: No focal deficit present.     Mental Status: She is alert and oriented to person, place, and time. Mental status is at baseline.     ASSESSMENT/PLAN:   Assessment & Plan Acute cystitis without hematuria    Assessment and Plan    Acute cystitis Urinalysis indicates bacterial infection. Isolated episode without recent UTIs. - Prescribed Macrobid  100 mg BID for 5 days. - Advised continuation of Azo for symptom relief, may cause orange urine. - Sent urine for culture to confirm bacterial growth and sensitivity.         Hershall Benkert A. Vita MD [facility] "

## 2024-11-18 NOTE — Telephone Encounter (Signed)
 FYI Only or Action Required?: FYI only for provider: appointment scheduled on 11/18/2024 at 11:30am with Dr Reyne Bustle.  Patient was last seen in primary care on 08/07/2024 by Randol Dawes, MD.  Called Nurse Triage reporting Urinary Frequency.  Symptoms began a week ago.  Interventions attempted: OTC medications: AZO, Rest, hydration, or home remedies, and Other: Cranberry Juice.  Symptoms are: unchanged.  Triage Disposition: See Physician Within 24 Hours  Patient/caregiver understands and will follow disposition?: Yes            Copied from CRM #[id number]. Topic: Clinical - Red Word Triage >> Nov 18, 2024  9:28 AM Amy B wrote: Red Word that prompted transfer to Nurse Triage: Burning and pain with urination, blood in urine Reason for Disposition  Urinating more frequently than usual (i.e., frequency) OR new-onset of the feeling of an urgent need to urinate (i.e., urgency)  Answer Assessment - Initial Assessment Questions Patient called and states urinary symptoms that started last week Patient reports: Urgency Frequency Some blood in urine  Patient states she has taken AZO and drank Cranberry Juice Patient denies any injuries, known fevers, new back or flank pain.  Patient is advised to call us  back if anything changes or with any further questions/concerns. Patient is advised that if anything worsens to go to the Emergency Room. Patient verbalized understanding.  Answer Assessment - Initial Assessment Questions Patient called and states urinary symptoms that started last week Patient reports: Urgency Frequency Some blood in urine  Patient states she has taken AZO and drank Cranberry Juice Patient denies any injuries, known fevers, new back or flank pain.  Patient is advised to call us  back if anything changes or with any further questions/concerns. Patient is advised that if anything worsens to go to the Emergency Room. Patient verbalized  understanding.  Protocols used: Urine - Blood In-A-AH, Urinary Symptoms-A-AH

## 2024-11-20 LAB — URINE CULTURE

## 2024-12-11 ENCOUNTER — Ambulatory Visit: Admitting: Family Medicine

## 2024-12-11 ENCOUNTER — Encounter: Payer: Self-pay | Admitting: Family Medicine

## 2024-12-11 VITALS — BP 120/64 | HR 64 | Temp 99.3°F | Ht 61.0 in | Wt 103.6 lb

## 2024-12-11 DIAGNOSIS — N952 Postmenopausal atrophic vaginitis: Secondary | ICD-10-CM

## 2024-12-11 DIAGNOSIS — R300 Dysuria: Secondary | ICD-10-CM

## 2024-12-11 DIAGNOSIS — R350 Frequency of micturition: Secondary | ICD-10-CM | POA: Diagnosis not present

## 2024-12-11 DIAGNOSIS — R3915 Urgency of urination: Secondary | ICD-10-CM | POA: Diagnosis not present

## 2024-12-11 LAB — POCT URINALYSIS DIP (PROADVANTAGE DEVICE)
Bilirubin, UA: NEGATIVE
Blood, UA: NEGATIVE
Glucose, UA: NEGATIVE mg/dL
Ketones, POC UA: NEGATIVE mg/dL
Leukocytes, UA: NEGATIVE
Nitrite, UA: NEGATIVE
Protein Ur, POC: NEGATIVE mg/dL
Specific Gravity, Urine: 1.005
Urobilinogen, Ur: 0.2
pH, UA: 7.5

## 2024-12-11 MED ORDER — ESTRADIOL 0.01 % VA CREA: TOPICAL_CREAM | VAGINAL | 1 refills | Status: AC

## 2024-12-11 NOTE — Patient Instructions (Signed)
 There is no evidence of any urinary tract infection today. In fact the culture from your last visit (12/23) did NOT show any infection. I suspect that either atrophic changes (related to being postmenopausal) and/or constipation are contributing to your urinary symptoms.  Be sure to eat a high fiber diet. Consider taking a fiber supplement and or stool softeners. You can also try a Smoothe Move tea to see if this gets your bowels back to normal.  I would like for you to start a vaginal estrogen cream You can use it on the tissue that was sensitive (right at the vaginal opening), and also slightly inside, to cover the urethra--where the urine comes out.  You do not need to use a full applicator of medication to the vagina. Use it daily for a week, and then use it just 2 times/week.  Try and do your kegel's exercises daily.  Hopefully between these two things your urinary symptoms improve.  If you develop fever, vomiting, cloudy or foul-smelling urine, blood in the urine, lower abdominal pain or flank pain, please return for re-evaluation.

## 2024-12-11 NOTE — Progress Notes (Signed)
 Chief Complaint  Patient presents with   Urinary Frequency    Patient has been having urinary frequency, urgency and burning x several weeks. Had a UTI 11/18/24 and saw Dr. Vita this is similar with more urgency. He rx'd macrobid  BID x 5 days.    She was seen by Dr. Vita 12/23 and treated for UTI with macrobid . She thought her symptoms resolved with the anbiotics prescribed by Dr. Vita.  She thinks her symptoms returned after she finished the antibiotics. She has had her current symptoms for about 2 weeks. Symptoms are urgency, burning (internal and external, mainly internal)  There is no odor or cloudiness. Denies any rash or redness.  She has 1 cup of tea daily, plus 1-2 green tea cups (unchanged). No soda or other caffeine. Denies any vaginal discharge, itching or bleeding.  She has been constipated.  She is straining, sometimes sees a spot of blood.    Chart reviewed-- Urine culture 12/23 grew <10K mixed urogenital flora (Urine dip was abnormal, had taken azo, was orange).   PMH, PSH, SH reviewed  Outpatient Encounter Medications as of 12/11/2024  Medication Sig Note   acetaminophen  (TYLENOL ) 500 MG tablet Take 500 mg by mouth every 6 (six) hours as needed. 12/11/2024: As needed   Azelaic Acid (FINACEA EX) Apply topically.     b complex vitamins capsule Take 1 capsule by mouth daily.     calcium  carbonate 1250 MG capsule Take 1,250 mg by mouth daily. 01/31/2024: Has some magnesium in it     cholecalciferol  (VITAMIN D ) 1000 UNITS tablet Take 1,000 Units by mouth daily.     fluticasone (FLONASE) 50 MCG/ACT nasal spray Place 1 spray into both nostrils daily.    MAGNESIUM GLYCINATE PO Take 1 tablet by mouth daily.    Multiple Vitamins-Minerals (MULTIVITAMIN WITH MINERALS) tablet Take 1 tablet by mouth daily.     olopatadine  (PATANOL) 0.1 % ophthalmic solution Place 1 drop into both eyes daily as needed for allergies. 12/11/2024: Seasonally, prn   pantoprazole  (PROTONIX ) 40 MG tablet Take  1 tablet by mouth 2 (two) times daily.    vitamin B-12 (CYANOCOBALAMIN) 500 MCG tablet Take 500 mcg by mouth daily.    vitamin E  1000 UNIT capsule Take 1,000 Units by mouth daily.     No facility-administered encounter medications on file as of 12/11/2024.   Allergies  Allergen Reactions   Tramadol  Other (See Comments)    confusion   Bactrim [Sulfamethoxazole-Trimethoprim] Hives    ROS: no f/c, abdominal pain, flank pain, n/v/d. +constipation. Urinary complaints per HPI. No URI symptoms or other complaints, see HPI.    PHYSICAL EXAM:  BP 120/64   Pulse 64   Temp 99.3 F (37.4 C) (Tympanic)   Ht 5' 1 (1.549 m)   Wt 103 lb 9.6 oz (47 kg)   BMI 19.58 kg/m   Well-appearing female, in good spirits, in no distress HEENT: conjunctiva and sclera are clear, EOMI Neck: no lymphadenopathy or mass Heart: regular rate and rhythm, occasional ectopy Lungs: clear bilaterally Back: no spinal or CVA tenderness GU:  some atrophic changes noted externally.  Slightly sensitive at introitus. Mild relaxation of bladder, no true cystocele Some thinning of tissue between rectum and vagina.  Firm stool felt in rectum, but no significant bulge to suggest that a rectocele is contributing to symptoms. Light brown stool present, no blood Extremities: no edema Psych: normal mood, affect, hygiene and grooming Neuro: alert and oriented, cranial nerves grossly intact, normal gait  Urine: SG 1.005, negative leuks, nitrite, protein, blood   ASSESSMENT/PLAN:  Postmenopausal atrophic vaginitis - estrogen cream to vaginal area (external/internal to get periurethral area, doesn't need full applicator) - Plan: estradiol  (ESTRACE ) 0.01 % CREA vaginal cream  Burning with urination - suspect related to atrophic vaginitis. Reassurred no e/o UTI - Plan: POCT Urinalysis DIP (Proadvantage Device)  Urinary frequency - constipation (vs slight rectocele) can contribute to urinary issues.  Discussed fiber  supplements, adequate hydration, high fiber diet - Plan: POCT Urinalysis DIP (Proadvantage Device)  Urinary urgency - Plan: POCT Urinalysis DIP (Proadvantage Device)  There is no evidence of any urinary tract infection today. In fact the culture from your last visit (12/23) did NOT show any infection. I suspect that either atrophic changes (related to being postmenopausal) and/or constipation are contributing to your urinary symptoms.  Be sure to eat a high fiber diet. Consider taking a fiber supplement and or stool softeners. You can also try a Smoothe Move tea to see if this gets your bowels back to normal.  I would like for you to start a vaginal estrogen cream You can use it on the tissue that was sensitive (right at the vaginal opening), and also slightly inside, to cover the urethra--where the urine comes out.  You do not need to use a full applicator of medication to the vagina. Use it daily for a week, and then use it just 2 times/week.  Try and do your kegel's exercises daily.  Hopefully between these two things your urinary symptoms improve.

## 2025-02-11 ENCOUNTER — Ambulatory Visit: Payer: Self-pay | Admitting: Family Medicine

## 2025-08-10 ENCOUNTER — Encounter: Payer: Self-pay | Admitting: Family Medicine
